# Patient Record
Sex: Female | Born: 1935 | Race: White | Hispanic: No | Smoking: Never smoker
Health system: Southern US, Community
[De-identification: ages and names within clinical notes are randomized; demographics above are authoritative.]

## PROBLEM LIST (undated history)

## (undated) DIAGNOSIS — R0902 Hypoxemia: Secondary | ICD-10-CM

## (undated) DIAGNOSIS — F419 Anxiety disorder, unspecified: Secondary | ICD-10-CM

## (undated) DIAGNOSIS — G709 Myoneural disorder, unspecified: Secondary | ICD-10-CM

## (undated) DIAGNOSIS — Z9181 History of falling: Secondary | ICD-10-CM

## (undated) DIAGNOSIS — Z85828 Personal history of other malignant neoplasm of skin: Secondary | ICD-10-CM

## (undated) DIAGNOSIS — H9190 Unspecified hearing loss, unspecified ear: Secondary | ICD-10-CM

## (undated) DIAGNOSIS — N189 Chronic kidney disease, unspecified: Secondary | ICD-10-CM

## (undated) DIAGNOSIS — M810 Age-related osteoporosis without current pathological fracture: Secondary | ICD-10-CM

## (undated) DIAGNOSIS — T7840XA Allergy, unspecified, initial encounter: Secondary | ICD-10-CM

## (undated) DIAGNOSIS — E039 Hypothyroidism, unspecified: Secondary | ICD-10-CM

## (undated) DIAGNOSIS — C801 Malignant (primary) neoplasm, unspecified: Secondary | ICD-10-CM

## (undated) DIAGNOSIS — IMO0002 Reserved for concepts with insufficient information to code with codable children: Secondary | ICD-10-CM

## (undated) DIAGNOSIS — I2510 Atherosclerotic heart disease of native coronary artery without angina pectoris: Secondary | ICD-10-CM

## (undated) DIAGNOSIS — H269 Unspecified cataract: Secondary | ICD-10-CM

## (undated) DIAGNOSIS — R011 Cardiac murmur, unspecified: Secondary | ICD-10-CM

## (undated) DIAGNOSIS — K219 Gastro-esophageal reflux disease without esophagitis: Secondary | ICD-10-CM

## (undated) DIAGNOSIS — E785 Hyperlipidemia, unspecified: Secondary | ICD-10-CM

## (undated) DIAGNOSIS — I509 Heart failure, unspecified: Secondary | ICD-10-CM

## (undated) DIAGNOSIS — I10 Essential (primary) hypertension: Secondary | ICD-10-CM

## (undated) DIAGNOSIS — M1990 Unspecified osteoarthritis, unspecified site: Secondary | ICD-10-CM

## (undated) DIAGNOSIS — I639 Cerebral infarction, unspecified: Secondary | ICD-10-CM

## (undated) DIAGNOSIS — F32A Depression, unspecified: Secondary | ICD-10-CM

## (undated) DIAGNOSIS — E119 Type 2 diabetes mellitus without complications: Secondary | ICD-10-CM

## (undated) HISTORY — DX: Cardiac murmur, unspecified: R01.1

## (undated) HISTORY — DX: Allergy, unspecified, initial encounter: T78.40XA

## (undated) HISTORY — DX: History of falling: Z91.81

## (undated) HISTORY — PX: DIAGNOSTIC MAMMOGRAM: HXRAD719

## (undated) HISTORY — DX: Myoneural disorder, unspecified: G70.9

## (undated) HISTORY — DX: Atherosclerotic heart disease of native coronary artery without angina pectoris: I25.10

## (undated) HISTORY — DX: Heart failure, unspecified: I50.9

## (undated) HISTORY — DX: Type 2 diabetes mellitus without complications: E11.9

## (undated) HISTORY — DX: Reserved for concepts with insufficient information to code with codable children: IMO0002

## (undated) HISTORY — DX: Age-related osteoporosis without current pathological fracture: M81.0

## (undated) HISTORY — DX: Hypoxemia: R09.02

## (undated) HISTORY — PX: BREAST SURGERY: SHX581

## (undated) HISTORY — DX: Hypothyroidism, unspecified: E03.9

## (undated) HISTORY — DX: Unspecified cataract: H26.9

## (undated) HISTORY — PX: OTHER SURGICAL HISTORY: SHX169

## (undated) HISTORY — PX: SKIN CANCER EXCISION: SHX779

## (undated) HISTORY — PX: CARDIAC CATHETERIZATION: SHX172

## (undated) HISTORY — DX: Malignant (primary) neoplasm, unspecified: C80.1

## (undated) HISTORY — DX: Unspecified osteoarthritis, unspecified site: M19.90

## (undated) HISTORY — PX: EYE SURGERY: SHX253

## (undated) HISTORY — DX: Cerebral infarction, unspecified: I63.9

## (undated) HISTORY — PX: ABDOMINAL HYSTERECTOMY: SHX81

## (undated) HISTORY — DX: Essential (primary) hypertension: I10

## (undated) HISTORY — DX: Depression, unspecified: F32.A

## (undated) HISTORY — PX: SPINE SURGERY: SHX786

## (undated) HISTORY — PX: CERVICAL SPINE SURGERY: SHX589

## (undated) HISTORY — PX: APPENDECTOMY: SHX54

## (undated) HISTORY — DX: Hyperlipidemia, unspecified: E78.5

## (undated) HISTORY — DX: Anxiety disorder, unspecified: F41.9

## (undated) HISTORY — PX: CARPAL TUNNEL RELEASE: SHX101

## (undated) HISTORY — DX: Gastro-esophageal reflux disease without esophagitis: K21.9

## (undated) HISTORY — PX: HERNIA REPAIR: SHX51

## (undated) HISTORY — DX: Chronic kidney disease, unspecified: N18.9

## (undated) HISTORY — PX: THORACIC SPINE SURGERY: SHX802

## (undated) HISTORY — DX: Personal history of other malignant neoplasm of skin: Z85.828

---

## 1958-03-18 HISTORY — PX: HYSTERECTOMY ABDOMINAL WITH SALPINGO-OOPHORECTOMY: SHX6792

## 1965-03-18 HISTORY — PX: CHOLECYSTECTOMY: SHX55

## 2010-03-18 HISTORY — PX: MASTECTOMY: SHX3

## 2020-08-18 ENCOUNTER — Encounter: Payer: Self-pay | Admitting: Neurology

## 2020-09-29 NOTE — Progress Notes (Addendum)
NEUROLOGY CONSULTATION NOTE  Amy Kelly MRN: [id number] DOB: [birth year]  Referring provider: Sherrilee Gilles, DO Primary care provider: Sherrilee Gilles, DO  Reason for consult:  stroke  Assessment/Plan:    Right thalamic lacunar infarct, likely secondary to small vessel disease Type 2 diabetes mellitus  Hypertension  Hyperlipidemia  1.Secondary stroke prevention as managed by PCP: - May stop ASA.  Continue Plavix '75mg'$  daily - Statin therapy:  LDL goal less than 70 - Glycemic control:  Hgb A1c goal less than 7 - Normotensive blood pressure 2.  For thalamic pain, gabapentin '100mg'$  at bedtime.  Cautioned for drowsiness and dizziness.  Advised to take on a night when her son may be able to stay with her in the apartment the following day in order to see how she feels. 3.  Follow up 6 months.   Subjective:  Amy Kelly is an 87 year old right-handed female with HTN, DM II, HLD, CAD, CKD and RLS who presents for stroke.  History supplemented by referring provider's note.  Accompanied by her son.  Current medications:  ASA '81mg'$ , Plavix '75mg'$ , rosuvastatin '40mg'$ , amlodipine, losartan glimepiride  Patient had a stroke in November 2021, presenting with left sided weakness and numbness and blurred vision.  She presented to the hospital in [place], [place].  CT head showed no acute findings but MRI of brain showed acute/subacute lacunar infarct within the right thalamus.  She was outside the window for tPA.  Carotid ultrasound demonstrated bilateral calcified plaque without hemodynamically significant stenosis.She was on ASA prior to stroke and discharged on DAPT.  Currently still taking ASA with Plavix.  She reports ongoing numbness and discomfort involving the left side of her head, face, arm, hand and leg.  She already has underlying polyneuropathy, gout and arthritis that impedes her balance and is now less steady.  Uses a rolling walker.       PAST MEDICAL HISTORY: Past Medical History:   Diagnosis Date   Anxiety and depression    At risk for falls    Coronary artery disease    Diabetes mellitus without complication (HCC)    GERD (gastroesophageal reflux disease)    History of skin cancer    Right shin   Hyperlipidemia    Hypertension    Hypothyroidism    Stroke [facility])     PAST SURGICAL HISTORY: Past Surgical History:  Procedure Laterality Date   CARDIAC CATHETERIZATION     [id number]   CARPAL TUNNEL RELEASE Left    CHOLECYSTECTOMY  1967   DIAGNOSTIC MAMMOGRAM     HERNIA REPAIR     HYSTERECTOMY ABDOMINAL WITH SALPINGO-OOPHORECTOMY  1960   MASTECTOMY Right 2012    MEDICATIONS: Current Outpatient Medications on File Prior to Visit  Medication Sig Dispense Refill   amLODipine (NORVASC) 10 MG tablet Take 10 mg by mouth daily.     Ascorbic Acid (VITAMIN C PO) Take 250 mg by mouth in the morning and at bedtime.     Calcium-Vitamin D 600-200 MG-UNIT tablet Take 5,000 Units by mouth daily.     carbidopa-levodopa (SINEMET IR) 25-100 MG tablet Take 25-100 mg by mouth 3 (three) times daily.     clopidogrel (PLAVIX) 75 MG tablet Take 75 mg by mouth daily.     Cranberry-Vitamin C-Vitamin E (CRANBERRY PLUS VITAMIN C) 4200-20-3 MG-MG-UNIT CAPS Take 4,200 mg by mouth in the morning and at bedtime.     doxazosin (CARDURA) 2 MG tablet Take 2 mg by mouth daily.     escitalopram (LEXAPRO)  5 MG tablet Take 5 mg by mouth daily.     glimepiride (AMARYL) 2 MG tablet Take 2 mg by mouth daily.     insulin aspart (NOVOLOG) 100 UNIT/ML FlexPen Inject into the skin.     insulin glargine (LANTUS SOLOSTAR) 100 UNIT/ML Solostar Pen Inject into the skin.     levothyroxine (SYNTHROID) 25 MCG tablet Take 25 mcg by mouth daily.     losartan (COZAAR) 100 MG tablet Take 100 mg by mouth daily.     metoprolol tartrate (LOPRESSOR) 25 MG tablet Take 25 mg by mouth in the morning and at bedtime.     Multiple Vitamin (MULTIVITAMIN ADULT PO) Take by mouth.     omeprazole (PRILOSEC) 20 MG capsule  Take by mouth.     rosuvastatin (CRESTOR) 40 MG tablet Take 40 mg by mouth daily.     No current facility-administered medications on file prior to visit.      ALLERGIES: Not on File  FAMILY HISTORY: No family history on file.  Objective:  Blood pressure (!) 157/59, pulse (!) 54, height '5\' 3"'$  (1.6 m), weight 199 lb 12.8 oz (90.6 kg), SpO2 95 %. General: No acute distress.  Patient appears well-groomed.   Head:  Normocephalic/atraumatic Eyes:  fundi examined but not visualized Neck: supple, no paraspinal tenderness, full range of motion Back: No paraspinal tenderness Heart: regular rate and rhythm Lungs: Clear to auscultation bilaterally. Vascular: No carotid bruits. Neurological Exam: Mental status: alert and oriented to person, place, and time, recent and remote memory intact, fund of knowledge intact, attention and concentration intact, speech fluent and not dysarthric, language intact. Cranial nerves: CN I: not tested CN II: pupils equal, round and reactive to light, visual fields intact CN III, IV, VI:  full range of motion, no nystagmus, no ptosis CN V: reduced left V1-V3 distribution CN VII: upper and lower face symmetric CN VIII: hearing intact CN IX, X: gag intact, uvula midline CN XI: sternocleidomastoid and trapezius muscles intact CN XII: tongue midline Bulk & Tone: normal, no fasciculations. Motor:  muscle strength 5-/5 throughout Sensation:  Pinprick sensation reduced in left hand and bilateral lower extremities, vibratory sensation reduced in bilateral lower extremities. Deep Tendon Reflexes:  absent throughout,  toes downgoing.   Finger to nose testing:  Without dysmetria.   Heel to shin:  Without dysmetria.   Gait:  Cautious unsteady wide-based stance and gait.  Uses rolling walker for assistance.      Thank you for allowing me to take part in the care of this patient.  Metta Clines, DO  CC: Sherrilee Gilles, DO

## 2020-10-02 ENCOUNTER — Other Ambulatory Visit: Payer: Self-pay

## 2020-10-02 ENCOUNTER — Encounter: Payer: Self-pay | Admitting: Neurology

## 2020-10-02 ENCOUNTER — Ambulatory Visit (INDEPENDENT_AMBULATORY_CARE_PROVIDER_SITE_OTHER): Payer: Medicare HMO | Admitting: Neurology

## 2020-10-02 VITALS — BP 157/59 | HR 54 | Ht 63.0 in | Wt 199.8 lb

## 2020-10-02 DIAGNOSIS — I6381 Other cerebral infarction due to occlusion or stenosis of small artery: Secondary | ICD-10-CM

## 2020-10-02 DIAGNOSIS — E785 Hyperlipidemia, unspecified: Secondary | ICD-10-CM

## 2020-10-02 DIAGNOSIS — E1142 Type 2 diabetes mellitus with diabetic polyneuropathy: Secondary | ICD-10-CM

## 2020-10-02 DIAGNOSIS — I10 Essential (primary) hypertension: Secondary | ICD-10-CM | POA: Diagnosis not present

## 2020-10-02 DIAGNOSIS — I639 Cerebral infarction, unspecified: Secondary | ICD-10-CM

## 2020-10-02 DIAGNOSIS — G890 Central pain syndrome: Secondary | ICD-10-CM

## 2020-10-02 MED ORDER — GABAPENTIN 100 MG PO CAPS: 100.0000 mg | ORAL_CAPSULE | Freq: Every day | ORAL | 5 refills | Status: DC

## 2020-10-02 NOTE — Patient Instructions (Signed)
Stop aspirin.  Continue clopidogrel '75mg'$  daily  Continue rosuvastatin Continue blood pressure medications Continue diabetes medication For left arm pain, take gabapentin '100mg'$  at bedtime.  Caution for drowsiness/dizziness.  Take it on a night that your son will stay with you in your apartment the next day. Follow up 6 months.

## 2020-11-06 LAB — HM DEXA SCAN

## 2020-11-08 ENCOUNTER — Other Ambulatory Visit: Payer: Self-pay

## 2020-11-08 ENCOUNTER — Encounter (HOSPITAL_COMMUNITY): Payer: Self-pay | Admitting: *Deleted

## 2020-11-08 ENCOUNTER — Inpatient Hospital Stay (HOSPITAL_COMMUNITY)
Admission: EM | Admit: 2020-11-08 | Discharge: 2020-11-11 | DRG: 291 | Disposition: A | Discharge status: Home Health | Payer: Medicare HMO | Attending: Family Medicine | Admitting: Family Medicine

## 2020-11-08 ENCOUNTER — Emergency Department (HOSPITAL_COMMUNITY): Payer: Medicare HMO

## 2020-11-08 DIAGNOSIS — I130 Hypertensive heart and chronic kidney disease with heart failure and stage 1 through stage 4 chronic kidney disease, or unspecified chronic kidney disease: Secondary | ICD-10-CM | POA: Diagnosis present

## 2020-11-08 DIAGNOSIS — Z85828 Personal history of other malignant neoplasm of skin: Secondary | ICD-10-CM

## 2020-11-08 DIAGNOSIS — M7989 Other specified soft tissue disorders: Secondary | ICD-10-CM | POA: Diagnosis present

## 2020-11-08 DIAGNOSIS — I5082 Biventricular heart failure: Secondary | ICD-10-CM | POA: Diagnosis present

## 2020-11-08 DIAGNOSIS — E1122 Type 2 diabetes mellitus with diabetic chronic kidney disease: Secondary | ICD-10-CM | POA: Diagnosis present

## 2020-11-08 DIAGNOSIS — E119 Type 2 diabetes mellitus without complications: Secondary | ICD-10-CM

## 2020-11-08 DIAGNOSIS — Z8249 Family history of ischemic heart disease and other diseases of the circulatory system: Secondary | ICD-10-CM

## 2020-11-08 DIAGNOSIS — D649 Anemia, unspecified: Secondary | ICD-10-CM | POA: Diagnosis present

## 2020-11-08 DIAGNOSIS — Z7902 Long term (current) use of antithrombotics/antiplatelets: Secondary | ICD-10-CM

## 2020-11-08 DIAGNOSIS — Z8673 Personal history of transient ischemic attack (TIA), and cerebral infarction without residual deficits: Secondary | ICD-10-CM

## 2020-11-08 DIAGNOSIS — G259 Extrapyramidal and movement disorder, unspecified: Secondary | ICD-10-CM | POA: Diagnosis present

## 2020-11-08 DIAGNOSIS — I5033 Acute on chronic diastolic (congestive) heart failure: Secondary | ICD-10-CM | POA: Diagnosis present

## 2020-11-08 DIAGNOSIS — I2510 Atherosclerotic heart disease of native coronary artery without angina pectoris: Secondary | ICD-10-CM | POA: Diagnosis present

## 2020-11-08 DIAGNOSIS — K219 Gastro-esophageal reflux disease without esophagitis: Secondary | ICD-10-CM | POA: Diagnosis present

## 2020-11-08 DIAGNOSIS — Z79890 Hormone replacement therapy (postmenopausal): Secondary | ICD-10-CM

## 2020-11-08 DIAGNOSIS — Z9079 Acquired absence of other genital organ(s): Secondary | ICD-10-CM

## 2020-11-08 DIAGNOSIS — Z9011 Acquired absence of right breast and nipple: Secondary | ICD-10-CM

## 2020-11-08 DIAGNOSIS — E039 Hypothyroidism, unspecified: Secondary | ICD-10-CM | POA: Diagnosis present

## 2020-11-08 DIAGNOSIS — R1310 Dysphagia, unspecified: Secondary | ICD-10-CM | POA: Diagnosis present

## 2020-11-08 DIAGNOSIS — H9190 Unspecified hearing loss, unspecified ear: Secondary | ICD-10-CM | POA: Diagnosis present

## 2020-11-08 DIAGNOSIS — E8770 Fluid overload, unspecified: Secondary | ICD-10-CM | POA: Diagnosis present

## 2020-11-08 DIAGNOSIS — I10 Essential (primary) hypertension: Secondary | ICD-10-CM | POA: Diagnosis present

## 2020-11-08 DIAGNOSIS — Z882 Allergy status to sulfonamides status: Secondary | ICD-10-CM

## 2020-11-08 DIAGNOSIS — R001 Bradycardia, unspecified: Secondary | ICD-10-CM | POA: Diagnosis present

## 2020-11-08 DIAGNOSIS — N185 Chronic kidney disease, stage 5: Secondary | ICD-10-CM | POA: Diagnosis present

## 2020-11-08 DIAGNOSIS — N179 Acute kidney failure, unspecified: Secondary | ICD-10-CM | POA: Diagnosis present

## 2020-11-08 DIAGNOSIS — E785 Hyperlipidemia, unspecified: Secondary | ICD-10-CM | POA: Diagnosis present

## 2020-11-08 DIAGNOSIS — E1169 Type 2 diabetes mellitus with other specified complication: Secondary | ICD-10-CM

## 2020-11-08 DIAGNOSIS — N184 Chronic kidney disease, stage 4 (severe): Secondary | ICD-10-CM | POA: Diagnosis present

## 2020-11-08 DIAGNOSIS — Z79899 Other long term (current) drug therapy: Secondary | ICD-10-CM

## 2020-11-08 DIAGNOSIS — Z20822 Contact with and (suspected) exposure to covid-19: Secondary | ICD-10-CM | POA: Diagnosis present

## 2020-11-08 DIAGNOSIS — J9601 Acute respiratory failure with hypoxia: Secondary | ICD-10-CM | POA: Diagnosis present

## 2020-11-08 DIAGNOSIS — Z66 Do not resuscitate: Secondary | ICD-10-CM | POA: Diagnosis present

## 2020-11-08 DIAGNOSIS — Z880 Allergy status to penicillin: Secondary | ICD-10-CM

## 2020-11-08 DIAGNOSIS — Z9181 History of falling: Secondary | ICD-10-CM

## 2020-11-08 DIAGNOSIS — F419 Anxiety disorder, unspecified: Secondary | ICD-10-CM | POA: Diagnosis present

## 2020-11-08 DIAGNOSIS — N1831 Chronic kidney disease, stage 3a: Secondary | ICD-10-CM

## 2020-11-08 DIAGNOSIS — F32A Depression, unspecified: Secondary | ICD-10-CM | POA: Diagnosis present

## 2020-11-08 DIAGNOSIS — I2699 Other pulmonary embolism without acute cor pulmonale: Secondary | ICD-10-CM

## 2020-11-08 DIAGNOSIS — Z90710 Acquired absence of both cervix and uterus: Secondary | ICD-10-CM

## 2020-11-08 DIAGNOSIS — I509 Heart failure, unspecified: Secondary | ICD-10-CM

## 2020-11-08 DIAGNOSIS — N183 Chronic kidney disease, stage 3 unspecified: Secondary | ICD-10-CM | POA: Diagnosis present

## 2020-11-08 DIAGNOSIS — Z794 Long term (current) use of insulin: Secondary | ICD-10-CM

## 2020-11-08 DIAGNOSIS — Z888 Allergy status to other drugs, medicaments and biological substances status: Secondary | ICD-10-CM

## 2020-11-08 DIAGNOSIS — I639 Cerebral infarction, unspecified: Secondary | ICD-10-CM | POA: Diagnosis present

## 2020-11-08 DIAGNOSIS — R0603 Acute respiratory distress: Secondary | ICD-10-CM | POA: Diagnosis not present

## 2020-11-08 HISTORY — DX: Type 2 diabetes mellitus without complications: E11.9

## 2020-11-08 HISTORY — DX: Unspecified hearing loss, unspecified ear: H91.90

## 2020-11-08 LAB — GLUCOSE, CAPILLARY: Glucose-Capillary: 157 mg/dL — ABNORMAL HIGH (ref 70–99)

## 2020-11-08 LAB — LACTIC ACID, PLASMA
Lactic Acid, Venous: 1.2 mmol/L (ref 0.5–1.9)
Lactic Acid, Venous: 0.9 mmol/L (ref 0.5–1.9)

## 2020-11-08 LAB — CBC WITH DIFFERENTIAL/PLATELET
Abs Immature Granulocytes: 0.03 10*3/uL (ref 0.00–0.07)
Basophils Absolute: 0.1 10*3/uL (ref 0.0–0.1)
Basophils Relative: 1 %
Eosinophils Absolute: 0.2 10*3/uL (ref 0.0–0.5)
Eosinophils Relative: 3 %
HCT: 31.6 % — ABNORMAL LOW (ref 36.0–46.0)
Hemoglobin: 9.9 g/dL — ABNORMAL LOW (ref 12.0–15.0)
Immature Granulocytes: 0 %
Lymphocytes Relative: 13 %
Lymphs Abs: 0.9 10*3/uL (ref 0.7–4.0)
MCH: 29.8 pg (ref 26.0–34.0)
MCHC: 31.3 g/dL (ref 30.0–36.0)
MCV: 95.2 fL (ref 80.0–100.0)
Monocytes Absolute: 0.7 10*3/uL (ref 0.1–1.0)
Monocytes Relative: 10 %
Neutro Abs: 5.1 10*3/uL (ref 1.7–7.7)
Neutrophils Relative %: 73 %
Platelets: 147 10*3/uL — ABNORMAL LOW (ref 150–400)
RBC: 3.32 MIL/uL — ABNORMAL LOW (ref 3.87–5.11)
RDW: 15.9 % — ABNORMAL HIGH (ref 11.5–15.5)
WBC: 6.9 10*3/uL (ref 4.0–10.5)
nRBC: 0 % (ref 0.0–0.2)

## 2020-11-08 LAB — RESP PANEL BY RT-PCR (FLU A&B, COVID) ARPGX2
Influenza A by PCR: NEGATIVE
Influenza B by PCR: NEGATIVE
SARS Coronavirus 2 by RT PCR: NEGATIVE

## 2020-11-08 LAB — BASIC METABOLIC PANEL
Anion gap: 6 (ref 5–15)
BUN: 29 mg/dL — ABNORMAL HIGH (ref 8–23)
CO2: 25 mmol/L (ref 22–32)
Calcium: 8.5 mg/dL — ABNORMAL LOW (ref 8.9–10.3)
Chloride: 110 mmol/L (ref 98–111)
Creatinine, Ser: 1.65 mg/dL — ABNORMAL HIGH (ref 0.44–1.00)
GFR, Estimated: 30 mL/min — ABNORMAL LOW (ref >60)
Glucose, Bld: 212 mg/dL — ABNORMAL HIGH (ref 70–99)
Potassium: 3.9 mmol/L (ref 3.5–5.1)
Sodium: 141 mmol/L (ref 135–145)

## 2020-11-08 LAB — TSH: TSH: 4.565 u[IU]/mL — ABNORMAL HIGH (ref 0.350–4.500)

## 2020-11-08 LAB — TROPONIN I (HIGH SENSITIVITY)
Troponin I (High Sensitivity): 7 ng/L (ref <18)
Troponin I (High Sensitivity): 8 ng/L (ref <18)

## 2020-11-08 LAB — BRAIN NATRIURETIC PEPTIDE: B Natriuretic Peptide: 299 pg/mL — ABNORMAL HIGH (ref 0.0–100.0)

## 2020-11-08 LAB — MAGNESIUM: Magnesium: 2 mg/dL (ref 1.7–2.4)

## 2020-11-08 MED ORDER — ACETAMINOPHEN 650 MG RE SUPP: 650.0000 mg | Freq: Four times a day (QID) | RECTAL | Status: DC | PRN

## 2020-11-08 MED ORDER — FUROSEMIDE 10 MG/ML IJ SOLN
40.0000 mg | Freq: Once | INTRAMUSCULAR | Status: AC
  Administered 2020-11-08: 40 mg via INTRAVENOUS
  Filled 2020-11-08: qty 4

## 2020-11-08 MED ORDER — PNEUMOCOCCAL VAC POLYVALENT 25 MCG/0.5ML IJ INJ: 0.5000 mL | INJECTION | INTRAMUSCULAR | Status: DC

## 2020-11-08 MED ORDER — ROSUVASTATIN CALCIUM 20 MG PO TABS
40.0000 mg | ORAL_TABLET | Freq: Every day | ORAL | Status: DC
  Administered 2020-11-09 – 2020-11-11 (×3): 40 mg via ORAL
  Filled 2020-11-08 (×3): qty 2

## 2020-11-08 MED ORDER — INSULIN ASPART 100 UNIT/ML IJ SOLN
0.0000 [IU] | Freq: Three times a day (TID) | INTRAMUSCULAR | Status: DC
  Administered 2020-11-09: 3 [IU] via SUBCUTANEOUS
  Administered 2020-11-09 (×2): 5 [IU] via SUBCUTANEOUS
  Administered 2020-11-10: 3 [IU] via SUBCUTANEOUS
  Administered 2020-11-10: 2 [IU] via SUBCUTANEOUS
  Administered 2020-11-11: 5 [IU] via SUBCUTANEOUS
  Administered 2020-11-11 (×2): 3 [IU] via SUBCUTANEOUS

## 2020-11-08 MED ORDER — FUROSEMIDE 10 MG/ML IJ SOLN
40.0000 mg | Freq: Two times a day (BID) | INTRAMUSCULAR | Status: DC
  Administered 2020-11-09 – 2020-11-10 (×3): 40 mg via INTRAVENOUS
  Filled 2020-11-08 (×3): qty 4

## 2020-11-08 MED ORDER — METHYLPREDNISOLONE SODIUM SUCC 125 MG IJ SOLR
60.0000 mg | Freq: Once | INTRAMUSCULAR | Status: AC
  Administered 2020-11-08: 60 mg via INTRAVENOUS
  Filled 2020-11-08: qty 2

## 2020-11-08 MED ORDER — INSULIN ASPART 100 UNIT/ML IJ SOLN
0.0000 [IU] | Freq: Every day | INTRAMUSCULAR | Status: DC
  Administered 2020-11-09: 2 [IU] via SUBCUTANEOUS

## 2020-11-08 MED ORDER — IPRATROPIUM-ALBUTEROL 0.5-2.5 (3) MG/3ML IN SOLN
3.0000 mL | Freq: Once | RESPIRATORY_TRACT | Status: AC
  Administered 2020-11-08: 3 mL via RESPIRATORY_TRACT
  Filled 2020-11-08: qty 3

## 2020-11-08 MED ORDER — INSULIN GLARGINE-YFGN 100 UNIT/ML ~~LOC~~ SOLN
5.0000 [IU] | Freq: Every day | SUBCUTANEOUS | Status: DC
Start: 2020-11-09 — End: 2020-11-11
  Administered 2020-11-09 – 2020-11-11 (×3): 5 [IU] via SUBCUTANEOUS
  Filled 2020-11-08 (×5): qty 0.05

## 2020-11-08 MED ORDER — ONDANSETRON HCL 4 MG/2ML IJ SOLN: 4.0000 mg | Freq: Four times a day (QID) | INTRAMUSCULAR | Status: DC | PRN

## 2020-11-08 MED ORDER — ENOXAPARIN SODIUM 30 MG/0.3ML IJ SOSY
30.0000 mg | PREFILLED_SYRINGE | INTRAMUSCULAR | Status: DC
  Administered 2020-11-08 – 2020-11-10 (×3): 30 mg via SUBCUTANEOUS
  Filled 2020-11-08 (×3): qty 0.3

## 2020-11-08 MED ORDER — POLYETHYLENE GLYCOL 3350 17 G PO PACK: 17.0000 g | PACK | Freq: Every day | ORAL | Status: DC | PRN

## 2020-11-08 MED ORDER — HYDRALAZINE HCL 20 MG/ML IJ SOLN: 10.0000 mg | INTRAMUSCULAR | Status: DC | PRN

## 2020-11-08 MED ORDER — ACETAMINOPHEN 325 MG PO TABS: 650.0000 mg | ORAL_TABLET | Freq: Four times a day (QID) | ORAL | Status: DC | PRN

## 2020-11-08 MED ORDER — ONDANSETRON HCL 4 MG PO TABS: 4.0000 mg | ORAL_TABLET | Freq: Four times a day (QID) | ORAL | Status: DC | PRN

## 2020-11-08 MED ORDER — CLOPIDOGREL BISULFATE 75 MG PO TABS
75.0000 mg | ORAL_TABLET | Freq: Every day | ORAL | Status: DC
  Administered 2020-11-09 – 2020-11-11 (×3): 75 mg via ORAL
  Filled 2020-11-08 (×3): qty 1

## 2020-11-08 MED ORDER — ALPRAZOLAM 0.5 MG PO TABS
0.5000 mg | ORAL_TABLET | Freq: Every day | ORAL | Status: AC
  Administered 2020-11-08: 0.5 mg via ORAL
  Filled 2020-11-08: qty 1

## 2020-11-08 NOTE — ED Notes (Signed)
md into speak with pt

## 2020-11-08 NOTE — ED Notes (Signed)
Have you seen this pt yet?  I told her that we were about to take her upstairs and that she was admitted and she doesn't want to go.  She stated crying and I tried to explain to her why she was staying and that she need oxygen.  She still wants to leave.  md messaged and will come speak with pt.

## 2020-11-08 NOTE — H&P (Addendum)
History and Physical    Amy Kelly [id number] DOB: June 22, 1928 DOA: 11/08/2020  PCP: Sherrilee Gilles, DO   Patient coming from: Home  I have personally briefly reviewed patient's old medical records in [place]  Chief Complaint: Difficulty breathing  HPI: Amy Kelly is a 87 y.o. female with medical history significant for DM, Stroke, HTN, hypothyroidism, coronary artery disease. Patient presented to the ED with complaints of difficulty breathing of 2 weeks duration.  Patient's son [name] is at bedside and helps with the history.  She reports breathing is worse with exertion.  Chronic stable orthopnea.  She has chronic bilateral lower extremity swelling that has been intermittent over the past year at least, she is unsure if it is worse. She reports chest tightness due to difficulty breathing without actual chest pain that started today.  Son at bedside reports patient has anxiety and panic attacks, and feels this is why she was having the chest tightness.  She does not know if she has gained weight, and is unsure of her baseline weight. At baseline patient is not very active.  ED Course: Temperature 97.6.  Bradycardic - heart rate 40s.  Respiratory rate 13-20.  Blood pressure systolic 0000000 to [id number].  O2 sats down to 88% on room air, currently on 3 L sats greater than 93%.  Chest x-ray showing findings consistent with mild congestive heart failure and interstitial edema. Low hemoglobin 9.9.  Troponin 7.  BNP 299. IV Lasix 40 mg given.  Hospitalist to admit for acute respiratory failure.  Review of Systems: As per HPI all other systems reviewed and negative.  Past Medical History:  Diagnosis Date   Anxiety and depression    At risk for falls    Coronary artery disease    Diabetes mellitus without complication (HCC)    GERD (gastroesophageal reflux disease)    History of skin cancer    Right shin   HOH (hard of hearing)    Hyperlipidemia    Hypertension    Hypothyroidism     Stroke [facility])     Past Surgical History:  Procedure Laterality Date   CARDIAC CATHETERIZATION     [phone]   CARPAL TUNNEL RELEASE Left    CHOLECYSTECTOMY  1967   DIAGNOSTIC MAMMOGRAM     HERNIA REPAIR     HYSTERECTOMY ABDOMINAL WITH SALPINGO-OOPHORECTOMY  1960   MASTECTOMY Right 2012     reports that she has never smoked. She has never been exposed to tobacco smoke. She has never used smokeless tobacco. She reports that she does not drink alcohol and does not use drugs.  Allergies  Allergen Reactions   Penicillins Diarrhea and Nausea Only   Prednisone Diarrhea and Nausea Only   Sulfa Antibiotics Diarrhea and Nausea Only   Family history of hypertension.  Prior to Admission medications   Medication Sig Start Date End Date Taking? Authorizing Provider  amLODipine (NORVASC) 10 MG tablet Take 10 mg by mouth daily. 05/01/20   [provider]  Ascorbic Acid (VITAMIN C PO) Take 250 mg by mouth in the morning and at bedtime. 05/30/20   [provider]  Calcium-Vitamin D 600-200 MG-UNIT tablet Take 5,000 Units by mouth daily. 06/25/18   [provider]  carbidopa-levodopa (SINEMET IR) 25-100 MG tablet Take 25-100 mg by mouth 3 (three) times daily. 07/31/20   [provider]  clopidogrel (PLAVIX) 75 MG tablet Take 75 mg by mouth daily. 04/10/20   [provider]  Cranberry-Vitamin C-Vitamin E (CRANBERRY PLUS  VITAMIN C) 4200-20-3 MG-MG-UNIT CAPS Take 4,200 mg by mouth in the morning and at bedtime. 05/30/20   [provider]  doxazosin (CARDURA) 2 MG tablet Take 2 mg by mouth daily. 03/30/20   [provider]  escitalopram (LEXAPRO) 5 MG tablet Take 5 mg by mouth daily. 11/26/19   [provider]  gabapentin (NEURONTIN) 100 MG capsule Take 1 capsule (100 mg total) by mouth at bedtime. 10/02/20   Tomi Likens, Adam R, DO  glimepiride (AMARYL) 2 MG tablet Take 2 mg by mouth daily. 03/13/20   [provider]  insulin aspart  (NOVOLOG) 100 UNIT/ML FlexPen Inject into the skin. 04/10/20   [provider]  insulin glargine (LANTUS SOLOSTAR) 100 UNIT/ML Solostar Pen Inject into the skin. 05/02/20   [provider]  levothyroxine (SYNTHROID) 25 MCG tablet Take 25 mcg by mouth daily. 07/07/20   [provider]  losartan (COZAAR) 100 MG tablet Take 100 mg by mouth daily. 03/30/20   [provider]  metoprolol tartrate (LOPRESSOR) 25 MG tablet Take 25 mg by mouth in the morning and at bedtime. 09/18/20   [provider]  Multiple Vitamin (MULTIVITAMIN ADULT PO) Take by mouth. 05/30/20   [provider]  omeprazole (PRILOSEC) 20 MG capsule Take by mouth. 06/08/19   [provider]  rosuvastatin (CRESTOR) 40 MG tablet Take 40 mg by mouth daily. 05/12/20   [provider]    Physical Exam: Vitals:   11/08/20 1503 11/08/20 1600 11/08/20 1634 11/08/20 1700  BP:  (!) 158/52  (!) 149/58  Pulse:  (!) 45  (!) 46  Resp:  13  18  Temp:      SpO2:  97% 96% 99%  Height: '5\' 3"'$  (1.6 m)       Constitutional: Patient is crying, anxious, very upset when she was told she needed to be admitted.    Vitals:   11/08/20 1503 11/08/20 1600 11/08/20 1634 11/08/20 1700  BP:  (!) 158/52  (!) 149/58  Pulse:  (!) 45  (!) 46  Resp:  13  18  Temp:      SpO2:  97% 96% 99%  Height: '5\' 3"'$  (1.6 m)      Eyes: PERRL, lids and conjunctivae normal ENMT: Mucous membranes are moist.  Neck: normal, supple, no masses, no thyromegaly Respiratory: clear to auscultation bilaterally, no wheezing, no crackles. Normal respiratory effort. No accessory muscle use.  Cardiovascular: Bradycardic, regular rate and rhythm, no murmurs / rubs / gallops.  Bilateral pitting lower extremity swelling, +1 to knees.   Lower extremities warm and well-perfused. Abdomen: no tenderness, no masses palpated. No hepatosplenomegaly. Bowel sounds positive.  Musculoskeletal: no clubbing / cyanosis. No joint deformity  upper and lower extremities. Good ROM, no contractures. Normal muscle tone.  Skin: no rashes, lesions, ulcers. No induration Neurologic: Moving all extremities spontaneously, no apparent cranial nerve abnormality. Psychiatric: Crying, very anxious, later calmed down and was able to answer questions..   Labs on Admission: I have personally reviewed following labs and imaging studies  CBC: Recent Labs  Lab 11/08/20 1523  WBC 6.9  NEUTROABS 5.1  HGB 9.9*  HCT 31.6*  MCV 95.2  PLT [id number]*   Basic Metabolic Panel: Recent Labs  Lab 11/08/20 1523  NA 141  K 3.9  CL 110  CO2 25  GLUCOSE 212*  BUN 29*  CREATININE 1.65*  CALCIUM 8.5*    Radiological Exams on Admission: DG Chest Port 1 View  Result Date: 11/08/2020 CLINICAL  DATA:  Short of breath for 2 weeks, pain with inspiration EXAM: PORTABLE CHEST 1 VIEW COMPARISON:  None. FINDINGS: Single frontal view of the chest demonstrates an enlarged cardiac silhouette. There is central vascular congestion with basilar predominant interstitial opacities and trace bilateral pleural effusions, most consistent with mild interstitial edema and volume overload. No pneumothorax. IMPRESSION: 1. Findings consistent with mild congestive heart failure and interstitial edema. Electronically Signed   By: Randa Ngo M.D.   On: 11/08/2020 15:56    EKG: Independently reviewed.  Sinus bradycardia, rate 47, PR interval 148.  RBBB, LAFB, QTC 447.  Assessment/Plan Principal Problem:   Acute respiratory failure with hypoxia (HCC) Active Problems:   Fluid overload   Diabetes mellitus (HCC)   HTN (hypertension)   Bradycardia   Hypothyroidism   Anxiety   Stroke ([facility])   Acute respiratory failure with hypoxia, O2 sats 88% on room air.  Currently, last sats 93 to 99%.  Chest x-ray suggesting CHF. -Supplemental O2.  Fluid overload -with findings consistent of mild CHF and interstitial edema, +1 pitting extremity edema.  BNP elevated-299, no prior to  compare.  Troponin 7 > 8.  EKG without significant ST or T wave abnormalities, no old EKG to compare. -Lasix 40 twice daily -Obtain Echo -Bilateral lower extremity Dopplers  Bradycardia-heart rate 40s, patient awake alert sitting up in bed.  Likely symptomatic and contributing to her dyspnea on exertion. -check magnesium -Check TSH -Cardiology consultation -Hold metoprolol  Anemia-hemoglobin 9.9.  No prior to compare.  No obvious GI blood loss -CBC in the morning -Anemia panel in the morning.  Dysphagia-reports history of multiple esophageal dilatations. -GI consult  CKD 3B- Cr 1.65 -Monitor with diuresis  Diabetes-random glucose 212. - HgbA1c - SSI- M -Med reconciliation pending - hold glimepiride -Son reports patient takes Lantus 5 units in the morning,will resume  Hypertension-stable. -Med reconciliation pending, patient is unable to give me details of medications from patient  Hypothyroidism-reports recently abnormal thyroid numbers requiring increased Synthroid dose by PCP. -Check TSH with bradycardia -Pending med reconciliation resume Synthroid  Dyslipidemia -resume Crestor  Anxiety/depression-anxious, crying. -Pending med reconciliation, resume Lexapro -Xanax 0.5x1 for sleep.  CVA-stable. -resume Plavix, statins  DVT prophylaxis: Lovenox Code Status:  DNR-confirmed with son [name] at bedside. Family Communication: Talked to son [name] at bedside who is healthcare power of attorney. Disposition Plan: ~ 2 days Consults called: Cardiology Admission status: Inpt, tele I certify that at the point of admission it is my clinical judgment that the patient will require inpatient hospital care spanning beyond 2 midnights from the point of admission due to high intensity of service, high risk for further deterioration and high frequency of surveillance required.   Bethena Roys MD [facility]  11/08/2020, 8:24 PM

## 2020-11-08 NOTE — ED Triage Notes (Signed)
Pt with SOB x 2 weeks and pt denies using home O2. Pt with chest pain with deep breathing.  RA sats 86-88% in triage. Pt very HOH. Has not been tested for covid in past couple of weeks.

## 2020-11-08 NOTE — ED Provider Notes (Signed)
[facility] EMERGENCY DEPARTMENT Provider Note   CSN: [id number] Arrival date & time: 11/08/20  1449     History Chief Complaint  Patient presents with   Shortness of Breath    Amy Kelly is a 87 y.o. female.  Patient presents to ER chief complaint of shortness of breath.  Symptoms ongoing for about 2 weeks.  Associated cough.  Denies fevers.  Also complaining of chest tightness.  Denies vomiting or diarrhea.      Past Medical History:  Diagnosis Date   Anxiety and depression    At risk for falls    Coronary artery disease    Diabetes mellitus without complication (HCC)    GERD (gastroesophageal reflux disease)    History of skin cancer    Right shin   HOH (hard of hearing)    Hyperlipidemia    Hypertension    Hypothyroidism    Stroke [facility])     Patient Active Problem List   Diagnosis Date Noted   Acute respiratory failure with hypoxia ([place]) 11/08/2020    Past Surgical History:  Procedure Laterality Date   CARDIAC CATHETERIZATION     [id number]   CARPAL TUNNEL RELEASE Left    CHOLECYSTECTOMY  1967   DIAGNOSTIC MAMMOGRAM     HERNIA REPAIR     HYSTERECTOMY ABDOMINAL WITH SALPINGO-OOPHORECTOMY  1960   MASTECTOMY Right 2012     OB History   No obstetric history on file.     History reviewed. No pertinent family history.  Social History   Tobacco Use   Smoking status: Never    Passive exposure: Never   Smokeless tobacco: Never  Vaping Use   Vaping Use: Never used  Substance Use Topics   Alcohol use: Never   Drug use: Never    Home Medications Prior to Admission medications   Medication Sig Start Date End Date Taking? Authorizing Provider  amLODipine (NORVASC) 10 MG tablet Take 10 mg by mouth daily. 05/01/20   [provider]  Ascorbic Acid (VITAMIN C PO) Take 250 mg by mouth in the morning and at bedtime. 05/30/20   [provider]  Calcium-Vitamin D 600-200 MG-UNIT tablet Take 5,000 Units by mouth daily. 06/25/18   [provider]  carbidopa-levodopa (SINEMET IR) 25-100 MG tablet Take 25-100 mg by mouth 3 (three) times daily. 07/31/20   [provider]  clopidogrel (PLAVIX) 75 MG tablet Take 75 mg by mouth daily. 04/10/20   [provider]  Cranberry-Vitamin C-Vitamin E (CRANBERRY PLUS VITAMIN C) 4200-20-3 MG-MG-UNIT CAPS Take 4,200 mg by mouth in the morning and at bedtime. 05/30/20   [provider]  doxazosin (CARDURA) 2 MG tablet Take 2 mg by mouth daily. 03/30/20   [provider]  escitalopram (LEXAPRO) 5 MG tablet Take 5 mg by mouth daily. 11/26/19   [provider]  gabapentin (NEURONTIN) 100 MG capsule Take 1 capsule (100 mg total) by mouth at bedtime. 10/02/20   Tomi Likens, Adam R, DO  glimepiride (AMARYL) 2 MG tablet Take 2 mg by mouth daily. 03/13/20   [provider]  insulin aspart (NOVOLOG) 100 UNIT/ML FlexPen Inject into the skin. 04/10/20   [provider]  insulin glargine (LANTUS SOLOSTAR) 100 UNIT/ML Solostar Pen Inject into the skin. 05/02/20   [provider]  levothyroxine (SYNTHROID) 25 MCG tablet Take 25 mcg by mouth daily. 07/07/20   [provider]  losartan (COZAAR) 100 MG tablet Take 100 mg by mouth daily. 03/30/20   [provider]  metoprolol tartrate (LOPRESSOR) 25 MG tablet Take 25 mg by mouth in the morning and at bedtime. 09/18/20   [provider]  Multiple Vitamin (MULTIVITAMIN ADULT PO) Take by mouth. 05/30/20   [provider]  omeprazole (PRILOSEC) 20 MG capsule Take by mouth. 06/08/19   [provider]  rosuvastatin (CRESTOR) 40 MG tablet Take 40 mg by mouth daily. 05/12/20   [provider]    Allergies    Penicillins, Prednisone, and Sulfa antibiotics  Review of Systems   Review of Systems  Constitutional:  Negative for fever.  HENT:  Negative for ear pain.   Eyes:  Negative for pain.  Respiratory:  Positive for cough and shortness of breath.    Cardiovascular:  Negative for chest pain.  Gastrointestinal:  Negative for abdominal pain.  Genitourinary:  Negative for flank pain.  Musculoskeletal:  Negative for back pain.  Skin:  Negative for rash.  Neurological:  Negative for headaches.   Physical Exam Updated Vital Signs BP (!) 149/58   Pulse (!) 46   Temp 97.6 F (36.4 C)   Resp 18   Ht '5\' 3"'$  (1.6 m)   SpO2 99%   BMI 35.39 kg/m   Physical Exam Constitutional:      General: She is in acute distress.     Appearance: Normal appearance.  HENT:     Head: Normocephalic.     Nose: Nose normal.  Eyes:     Extraocular Movements: Extraocular movements intact.  Cardiovascular:     Rate and Rhythm: Normal rate.  Pulmonary:     Effort: Tachypnea and accessory muscle usage present.     Breath sounds: Decreased breath sounds and wheezing present.  Musculoskeletal:        General: Normal range of motion.     Cervical back: Normal range of motion.  Neurological:     General: No focal deficit present.     Mental Status: She is alert. Mental status is at baseline.    ED Results / Procedures / Treatments   Labs (all labs ordered are listed, but only abnormal results are displayed) Labs Reviewed  CBC WITH DIFFERENTIAL/PLATELET - Abnormal; Notable for the following components:      Result Value   RBC 3.32 (*)    Hemoglobin 9.9 (*)    HCT 31.6 (*)    RDW 15.9 (*)    Platelets 147 (*)    All other components within normal limits  BASIC METABOLIC PANEL - Abnormal; Notable for the following components:   Glucose, Bld 212 (*)    BUN 29 (*)    Creatinine, Ser 1.65 (*)    Calcium 8.5 (*)    GFR, Estimated 30 (*)    All other components within normal limits  BRAIN NATRIURETIC PEPTIDE - Abnormal; Notable for the following components:   B Natriuretic Peptide 299.0 (*)    All other components within normal limits  CULTURE, BLOOD (ROUTINE X 2)  RESP PANEL BY RT-PCR (FLU A&B, COVID) ARPGX2  CULTURE, BLOOD (ROUTINE X 2)   LACTIC ACID, PLASMA  LACTIC ACID, PLASMA  TROPONIN I (HIGH SENSITIVITY)  TROPONIN I (HIGH SENSITIVITY)    EKG  Sinus rhythm, normal rate, no ST elevations, no ST depressions.  Radiology DG Chest Port 1 View  Result Date: 11/08/2020 CLINICAL DATA:  Short of breath for 2 weeks, pain with inspiration EXAM: PORTABLE CHEST 1 VIEW COMPARISON:  None. FINDINGS: Single frontal view of the chest demonstrates an enlarged cardiac silhouette.  There is central vascular congestion with basilar predominant interstitial opacities and trace bilateral pleural effusions, most consistent with mild interstitial edema and volume overload. No pneumothorax. IMPRESSION: 1. Findings consistent with mild congestive heart failure and interstitial edema. Electronically Signed   By: Randa Ngo M.D.   On: 11/08/2020 15:56    Procedures .Critical Care  Date/Time: 11/08/2020 5:32 PM Performed by: Luna Fuse, MD Authorized by: Luna Fuse, MD   Critical care provider statement:    Critical care time (minutes):  30   Critical care time was exclusive of:  Separately billable procedures and treating other patients   Critical care was necessary to treat or prevent imminent or life-threatening deterioration of the following conditions:  Respiratory failure and cardiac failure Comments:     Concern for new hypoxemia possibly due to CHF   Medications Ordered in ED Medications  furosemide (LASIX) injection 40 mg (40 mg Intravenous Given 11/08/20 1624)  ipratropium-albuterol (DUONEB) 0.5-2.5 (3) MG/3ML nebulizer solution 3 mL (3 mLs Nebulization Given 11/08/20 1634)  methylPREDNISolone sodium succinate (SOLU-MEDROL) 125 mg/2 mL injection 60 mg (60 mg Intravenous Given 11/08/20 1624)    ED Course  I have reviewed the triage vital signs and the nursing notes.  Pertinent labs & imaging results that were available during my care of the patient were reviewed by me and considered in my medical decision making (see  chart for details).    MDM Rules/Calculators/A&P                           Patient presented significant respite distress.  She can only get 1 or 2 word sentences out.  Breath sounds are diminished bilaterally tight wheezes bilaterally.  Chest x-ray concerning for pulmonary congestion.  proBNP mildly elevated.  White count normal lactic acid normal no evidence of fevers or pneumonia.  Patient given IV Lasix, continues on O2 supplementation.  Will be brought into the hospitalist team.   Final Clinical Impression(s) / ED Diagnoses Final diagnoses:  Acute respiratory failure with hypoxia ([place])  Acute congestive heart failure, unspecified heart failure type [facility])    Rx / DC Orders ED Discharge Orders     None        Luna Fuse, MD 11/08/20 1734

## 2020-11-09 ENCOUNTER — Inpatient Hospital Stay (HOSPITAL_COMMUNITY): Payer: Medicare HMO

## 2020-11-09 ENCOUNTER — Encounter (HOSPITAL_COMMUNITY): Payer: Self-pay | Admitting: Internal Medicine

## 2020-11-09 DIAGNOSIS — N185 Chronic kidney disease, stage 5: Secondary | ICD-10-CM | POA: Diagnosis present

## 2020-11-09 DIAGNOSIS — J9601 Acute respiratory failure with hypoxia: Secondary | ICD-10-CM | POA: Diagnosis not present

## 2020-11-09 DIAGNOSIS — N184 Chronic kidney disease, stage 4 (severe): Secondary | ICD-10-CM | POA: Diagnosis present

## 2020-11-09 DIAGNOSIS — R0603 Acute respiratory distress: Secondary | ICD-10-CM | POA: Diagnosis not present

## 2020-11-09 LAB — CBC
HCT: 30 % — ABNORMAL LOW (ref 36.0–46.0)
Hemoglobin: 9.3 g/dL — ABNORMAL LOW (ref 12.0–15.0)
MCH: 29.2 pg (ref 26.0–34.0)
MCHC: 31 g/dL (ref 30.0–36.0)
MCV: 94.3 fL (ref 80.0–100.0)
Platelets: 137 10*3/uL — ABNORMAL LOW (ref 150–400)
RBC: 3.18 MIL/uL — ABNORMAL LOW (ref 3.87–5.11)
RDW: 15.6 % — ABNORMAL HIGH (ref 11.5–15.5)
WBC: 6.8 10*3/uL (ref 4.0–10.5)
nRBC: 0 % (ref 0.0–0.2)

## 2020-11-09 LAB — RETICULOCYTES
Immature Retic Fract: 15 % (ref 2.3–15.9)
RBC.: 3.2 MIL/uL — ABNORMAL LOW (ref 3.87–5.11)
Retic Count, Absolute: 64 10*3/uL (ref 19.0–186.0)
Retic Ct Pct: 2 % (ref 0.4–3.1)

## 2020-11-09 LAB — BASIC METABOLIC PANEL
Anion gap: 11 (ref 5–15)
BUN: 29 mg/dL — ABNORMAL HIGH (ref 8–23)
CO2: 27 mmol/L (ref 22–32)
Calcium: 9 mg/dL (ref 8.9–10.3)
Chloride: 106 mmol/L (ref 98–111)
Creatinine, Ser: 1.67 mg/dL — ABNORMAL HIGH (ref 0.44–1.00)
GFR, Estimated: 29 mL/min — ABNORMAL LOW (ref >60)
Glucose, Bld: 205 mg/dL — ABNORMAL HIGH (ref 70–99)
Potassium: 4.4 mmol/L (ref 3.5–5.1)
Sodium: 144 mmol/L (ref 135–145)

## 2020-11-09 LAB — IRON AND TIBC
Iron: 31 ug/dL (ref 28–170)
Saturation Ratios: 9 % — ABNORMAL LOW (ref 10.4–31.8)
TIBC: 328 ug/dL (ref 250–450)
UIBC: 297 ug/dL

## 2020-11-09 LAB — ECHOCARDIOGRAM COMPLETE
AR max vel: 1.63 cm2
AV Area VTI: 1.71 cm2
AV Area mean vel: 1.54 cm2
AV Mean grad: 7.8 mmHg
AV Peak grad: 15.6 mmHg
Ao pk vel: 1.97 m/s
Area-P 1/2: 5.38 cm2
Height: 63 in
S' Lateral: 4.2 cm
Weight: 3389.79 oz

## 2020-11-09 LAB — HEMOGLOBIN A1C
Hgb A1c MFr Bld: 7 % — ABNORMAL HIGH (ref 4.8–5.6)
Mean Plasma Glucose: 154.2 mg/dL

## 2020-11-09 LAB — VITAMIN B12: Vitamin B-12: 1492 pg/mL — ABNORMAL HIGH (ref 180–914)

## 2020-11-09 LAB — FOLATE: Folate: 74.5 ng/mL (ref >5.9)

## 2020-11-09 LAB — GLUCOSE, CAPILLARY
Glucose-Capillary: 187 mg/dL — ABNORMAL HIGH (ref 70–99)
Glucose-Capillary: 243 mg/dL — ABNORMAL HIGH (ref 70–99)
Glucose-Capillary: 224 mg/dL — ABNORMAL HIGH (ref 70–99)
Glucose-Capillary: 202 mg/dL — ABNORMAL HIGH (ref 70–99)

## 2020-11-09 LAB — FERRITIN: Ferritin: 51 ng/mL (ref 11–307)

## 2020-11-09 MED ORDER — CALCIUM CARBONATE-VITAMIN D 500-200 MG-UNIT PO TABS
2.0000 | ORAL_TABLET | Freq: Every day | ORAL | Status: DC
  Administered 2020-11-10 – 2020-11-11 (×2): 2 via ORAL
  Filled 2020-11-09 (×3): qty 2

## 2020-11-09 MED ORDER — CITALOPRAM HYDROBROMIDE 20 MG PO TABS
20.0000 mg | ORAL_TABLET | Freq: Every day | ORAL | Status: DC
  Administered 2020-11-09 – 2020-11-11 (×3): 20 mg via ORAL
  Filled 2020-11-09 (×3): qty 1

## 2020-11-09 MED ORDER — LEVOTHYROXINE SODIUM 25 MCG PO TABS
25.0000 ug | ORAL_TABLET | Freq: Every day | ORAL | Status: DC
  Administered 2020-11-10 – 2020-11-11 (×2): 25 ug via ORAL
  Filled 2020-11-09 (×2): qty 1

## 2020-11-09 MED ORDER — ASCORBIC ACID 500 MG PO TABS
1000.0000 mg | ORAL_TABLET | Freq: Every day | ORAL | Status: DC
  Administered 2020-11-09 – 2020-11-11 (×3): 1000 mg via ORAL
  Filled 2020-11-09 (×3): qty 2

## 2020-11-09 MED ORDER — AMLODIPINE BESYLATE 5 MG PO TABS
5.0000 mg | ORAL_TABLET | Freq: Every day | ORAL | Status: DC
  Administered 2020-11-09: 5 mg via ORAL
  Filled 2020-11-09 (×2): qty 1

## 2020-11-09 MED ORDER — ALPRAZOLAM 0.5 MG PO TABS: 0.5000 mg | ORAL_TABLET | Freq: Every evening | ORAL | Status: DC | PRN

## 2020-11-09 MED ORDER — GABAPENTIN 100 MG PO CAPS
100.0000 mg | ORAL_CAPSULE | Freq: Every day | ORAL | Status: DC
  Administered 2020-11-09 – 2020-11-10 (×2): 100 mg via ORAL
  Filled 2020-11-09 (×2): qty 1

## 2020-11-09 MED ORDER — CARBIDOPA-LEVODOPA ER 25-100 MG PO TBCR
1.0000 | EXTENDED_RELEASE_TABLET | Freq: Every day | ORAL | Status: DC
  Administered 2020-11-09 – 2020-11-11 (×3): 1 via ORAL
  Filled 2020-11-09 (×3): qty 1

## 2020-11-09 MED ORDER — VITAMIN D 25 MCG (1000 UNIT) PO TABS
5000.0000 [IU] | ORAL_TABLET | Freq: Every day | ORAL | Status: DC
  Administered 2020-11-09 – 2020-11-11 (×3): 5000 [IU] via ORAL
  Filled 2020-11-09 (×3): qty 5

## 2020-11-09 MED ORDER — GUAIFENESIN-DM 100-10 MG/5ML PO SYRP
5.0000 mL | ORAL_SOLUTION | ORAL | Status: DC | PRN
  Administered 2020-11-09: 5 mL via ORAL
  Filled 2020-11-09: qty 5

## 2020-11-09 MED ORDER — FOLIC ACID 1 MG PO TABS
500.0000 ug | ORAL_TABLET | Freq: Every day | ORAL | Status: DC
  Administered 2020-11-09 – 2020-11-11 (×3): 0.5 mg via ORAL
  Filled 2020-11-09 (×3): qty 1

## 2020-11-09 MED ORDER — PANTOPRAZOLE SODIUM 40 MG PO TBEC
40.0000 mg | DELAYED_RELEASE_TABLET | Freq: Every day | ORAL | Status: DC
  Administered 2020-11-09 – 2020-11-11 (×3): 40 mg via ORAL
  Filled 2020-11-09 (×3): qty 1

## 2020-11-09 MED ORDER — VITAMIN B-12 100 MCG PO TABS
500.0000 ug | ORAL_TABLET | Freq: Every day | ORAL | Status: DC
  Administered 2020-11-09 – 2020-11-11 (×3): 500 ug via ORAL
  Filled 2020-11-09 (×3): qty 5

## 2020-11-09 MED ORDER — ADULT MULTIVITAMIN W/MINERALS CH
1.0000 | ORAL_TABLET | Freq: Every day | ORAL | Status: DC
  Administered 2020-11-09 – 2020-11-11 (×3): 1 via ORAL
  Filled 2020-11-09 (×3): qty 1

## 2020-11-09 NOTE — Progress Notes (Addendum)
Patient Demographics:    Amy Kelly, is a 87 y.o. female, DOB - 1928-11-29, [id number]  Admit date - 11/08/2020   Admitting Physician Bethena Roys, MD  Outpatient Primary MD for the patient is Sherrilee Gilles, DO  LOS - 1   Chief Complaint  Patient presents with   Shortness of Breath        Subjective:    Amy Kelly today has no fevers, no emesis,  No chest pain, shob is Not worse -c/o fatigue, voiding well  Assessment  & Plan :    Principal Problem:   Acute respiratory failure with hypoxia ([place]) Active Problems:   CKD (chronic kidney disease), stage IV (HCC)   HTN (hypertension)   Bradycardia   Fluid overload   Hypothyroidism   Anxiety   Stroke ([place])   Anemia  Brief Summary:- 87 y.o. female with medical history significant for DM, Stroke, HTN, hypothyroidism, coronary artery disease admitted on 11/08/2020 with acute hypoxic respiratory failure secondary to presumed CHF  A/p  1)Acute Hypoxic Respiratory Failure secondary to Presumed CHF-- ??  Diastolic Versus systolic Dysfunction CHF -Echo pending -Continue IV Lasix for diuresis, daily weights and fluid input and output monitoring -Chest x-ray and BNP on admission suggestive of CHF --Lower extremity edema noted-venous Dopplers pending  2)AKI----acute kidney injury on CKD stage - IV -Creatinine currently around 1.6-1.7 -Monitor renal function closely with IV diuresis  renally adjust medications, avoid nephrotoxic agents / dehydration  / hypotension  3) chronic normocytic and normochromic anemia--- no evidence of ongoing bleeding, hemoglobin currently greater than 9 -B12 and folate are not low -Ferritin and serum iron are not low -Suspect partly related to underlying CKD -Consider EPO/ESA agent per protocol  4)HTN-- give Amlodipine 5 mg daily (was on 10 mg PTA) -Hold losartan and Cardura -`--- may use IV hydralazine as  needed elevated BP, -Discontinue metoprolol due to bradycardia which may be contributing to her dyspnea on exertion  5)DM2--- A1c 7.0 reflecting fair diabetic control,  -Continue to hold glimepiride -Continue Lantus 5 units every morning Use Novolog/Humalog Sliding scale insulin with Accu-Cheks/Fingersticks as ordered   6)H/o Prior CVA--okay to continue Plavix and crestor for secondary prevention  7)Depression/Anxiety----appears less anxious today, continue citalopram 20 mg daily - may use Xanax as needed for sleep  8)Hypothyroidism--TSH is 4.5 , continue levothyroxine 25 mcg daily (will not increase at this time)  9) generalized weakness and deconditioning/movement disorder --- continue Sinemet and gabapentin  --await physical therapy eval  Disposition/Need for in-Hospital Stay- patient unable to be discharged at this time due to -acute hypoxic respiratory failure secondary to presumed CHF requiring IV diuresis and monitoring of renal function, as well as supplemental oxygen  Status is: Inpatient  Remains inpatient appropriate because: Please see disposition above  Disposition: The patient is from: Home --apartment complex              Anticipated d/c is to:  Await physical therapy eval              Anticipated d/c date is: 2 days              Patient currently is not medically stable to d/c. Barriers: Not Clinically Stable-   Code Status :  -  Code Status: DNR   Family Communication:    (patient is alert, awake and coherent)   Consults  :  na  DVT Prophylaxis  :   - SCDs  enoxaparin (LOVENOX) injection 30 mg Start: 11/08/20 2115    Lab Results  Component Value Date   PLT 137 (L) 11/09/2020    Inpatient Medications  Scheduled Meds:  amLODipine  5 mg Oral Daily   ascorbic acid  1,000 mg Oral Daily   [START ON 11/10/2020] calcium-vitamin D  2 tablet Oral Q breakfast   Carbidopa-Levodopa ER  1 tablet Oral Daily   cholecalciferol  5,000 Units Oral Daily   citalopram   20 mg Oral Daily   clopidogrel  75 mg Oral Daily   enoxaparin (LOVENOX) injection  30 mg Subcutaneous [id number]   folic acid  [id number] mcg Oral Daily   furosemide  40 mg Intravenous Q12H   gabapentin  100 mg Oral QHS   insulin aspart  0-15 Units Subcutaneous TID WC   insulin aspart  0-5 Units Subcutaneous QHS   insulin glargine-yfgn  5 Units Subcutaneous Daily   [START ON 11/10/2020] levothyroxine  25 mcg Oral Q0600   multivitamin with minerals  1 tablet Oral Daily   pantoprazole  40 mg Oral Daily   pneumococcal 23 valent vaccine  0.5 mL Intramuscular Tomorrow-1000   rosuvastatin  40 mg Oral Daily   vitamin B-12  500 mcg Oral Daily   Continuous Infusions: PRN Meds:.acetaminophen **OR** acetaminophen, ALPRAZolam, guaiFENesin-dextromethorphan, hydrALAZINE, ondansetron **OR** ondansetron (ZOFRAN) IV, polyethylene glycol    Anti-infectives (From admission, onward)    None         Objective:   Vitals:   11/08/20 2344 11/09/20 0424 11/09/20 0657 11/09/20 1303  BP: (!) 175/57 140/63  (!) 150/78  Pulse: (!) 53 (!) 56  (!) 53  Resp: 19 20    Temp: (!) 97.5 F (36.4 C) 98.1 F (36.7 C)  98.2 F (36.8 C)  TempSrc: Oral Oral  Oral  SpO2: (!) 85% 90%  90%  Weight:   96.1 kg   Height:        Wt Readings from Last 3 Encounters:  11/09/20 96.1 kg  10/02/20 90.6 kg     Intake/Output Summary (Last 24 hours) at 11/09/2020 1327 Last data filed at 11/09/2020 0900 Gross per 24 hour  Intake 200 ml  Output 200 ml  Net 0 ml   Physical Exam  Gen:- Awake Alert, in no acute distress HEENT:- [place].AT, No sclera icterus Nose-  3 L/min Neck-Supple Neck,No JVD,.  Lungs-diminished breath sounds, no wheezing  CV- S1, S2 normal, regular  Abd-  +ve B.Sounds, Abd Soft, No tenderness,    Extremity/Skin:-1 + pitting edema, pedal pulses present  Psych-affect is appropriate, oriented x3 Neuro-generalized weakness, no new focal deficits, no tremors   Data Review:   Micro Results Recent Results  (from the past 240 hour(s))  Resp Panel by RT-PCR (Flu A&B, Covid) Nasopharyngeal Swab     Status: None   Collection Time: 11/08/20  3:24 PM   Specimen: Nasopharyngeal Swab; Nasopharyngeal(NP) swabs in vial transport medium  Result Value Ref Range Status   SARS Coronavirus 2 by RT PCR NEGATIVE NEGATIVE Final    Comment: (NOTE) SARS-CoV-2 target nucleic acids are NOT DETECTED.  The SARS-CoV-2 RNA is generally detectable in upper respiratory specimens during the acute phase of infection. The lowest concentration of SARS-CoV-2 viral copies this assay can detect is 138 copies/mL. A negative result does not preclude  SARS-Cov-2 infection and should not be used as the sole basis for treatment or other patient management decisions. A negative result may occur with  improper specimen collection/handling, submission of specimen other than nasopharyngeal swab, presence of viral mutation(s) within the areas targeted by this assay, and inadequate number of viral copies(<138 copies/mL). A negative result must be combined with clinical observations, patient history, and epidemiological information. The expected result is Negative.  Fact Sheet for Patients:  EntrepreneurPulse.com.au  Fact Sheet for Healthcare Providers:  IncredibleEmployment.be  This test is no t yet approved or cleared by the [place] FDA and  has been authorized for detection and/or diagnosis of SARS-CoV-2 by FDA under an Emergency Use Authorization (EUA). This EUA will remain  in effect (meaning this test can be used) for the duration of the COVID-19 declaration under Section 564(b)(1) of the Act, 21 U.S.C.section 360bbb-3(b)(1), unless the authorization is terminated  or revoked sooner.       Influenza A by PCR NEGATIVE NEGATIVE Final   Influenza B by PCR NEGATIVE NEGATIVE Final    Comment: (NOTE) The Xpert Xpress SARS-CoV-2/FLU/RSV plus assay is intended as an aid in the  diagnosis of influenza from Nasopharyngeal swab specimens and should not be used as a sole basis for treatment. Nasal washings and aspirates are unacceptable for Xpert Xpress SARS-CoV-2/FLU/RSV testing.  Fact Sheet for Patients: EntrepreneurPulse.com.au  Fact Sheet for Healthcare Providers: IncredibleEmployment.be  This test is not yet approved or cleared by the [place] FDA and has been authorized for detection and/or diagnosis of SARS-CoV-2 by FDA under an Emergency Use Authorization (EUA). This EUA will remain in effect (meaning this test can be used) for the duration of the COVID-19 declaration under Section 564(b)(1) of the Act, 21 U.S.C. section 360bbb-3(b)(1), unless the authorization is terminated or revoked.  Performed at [facility], [address] [place], [place] [address]   Culture, blood (routine x 2)     Status: None (Preliminary result)   Collection Time: 11/08/20  4:15 PM   Specimen: BLOOD RIGHT HAND  Result Value Ref Range Status   Specimen Description BLOOD RIGHT HAND  Final   Special Requests   Final    BOTTLES DRAWN AEROBIC AND ANAEROBIC Blood Culture results may not be optimal due to an excessive volume of blood received in culture bottles   Culture   Final    NO GROWTH < 24 HOURS Performed at [facility], [address], [place], [place] [address]    Report Status PENDING  Incomplete  Culture, blood (routine x 2)     Status: None (Preliminary result)   Collection Time: 11/08/20  4:36 PM   Specimen: BLOOD RIGHT ARM  Result Value Ref Range Status   Specimen Description BLOOD RIGHT ARM  Final   Special Requests   Final    BOTTLES DRAWN AEROBIC AND ANAEROBIC Blood Culture adequate volume   Culture   Final    NO GROWTH < 12 HOURS Performed at [facility], [address] [place], [place] [address]    Report Status PENDING  Incomplete    Radiology Reports DG Chest Port 1 View  Result Date: 11/08/2020 CLINICAL  DATA:  Short of breath for 2 weeks, pain with inspiration EXAM: PORTABLE CHEST 1 VIEW COMPARISON:  None. FINDINGS: Single frontal view of the chest demonstrates an enlarged cardiac silhouette. There is central vascular congestion with basilar predominant interstitial opacities and trace bilateral pleural effusions, most consistent with mild interstitial edema and volume overload. No pneumothorax.  IMPRESSION: 1. Findings consistent with mild congestive heart failure and interstitial edema. Electronically Signed   By: Randa Ngo M.D.   On: 11/08/2020 15:56     CBC Recent Labs  Lab 11/08/20 1523 11/09/20 0530  WBC 6.9 6.8  HGB 9.9* 9.3*  HCT 31.6* 30.0*  PLT 147* 137*  MCV 95.2 94.3  MCH 29.8 29.2  MCHC 31.3 31.0  RDW 15.9* 15.6*  LYMPHSABS 0.9  --   MONOABS 0.7  --   EOSABS 0.2  --   BASOSABS 0.1  --     Chemistries  Recent Labs  Lab 11/08/20 1523 11/08/20 1741 11/09/20 0530  NA 141  --  144  K 3.9  --  4.4  CL 110  --  106  CO2 25  --  27  GLUCOSE 212*  --  205*  BUN 29*  --  29*  CREATININE 1.65*  --  1.67*  CALCIUM 8.5*  --  9.0  MG  --  2.0  --    ------------------------------------------------------------------------------------------------------------------ No results for input(s): CHOL, HDL, LDLCALC, TRIG, CHOLHDL, LDLDIRECT in the last 72 hours.  Lab Results  Component Value Date   HGBA1C 7.0 (H) 11/09/2020   ------------------------------------------------------------------------------------------------------------------ Recent Labs    11/08/20 1516  TSH 4.565*   ------------------------------------------------------------------------------------------------------------------ Recent Labs    11/09/20 0530  VITAMINB12 1,492*  FOLATE 74.5  FERRITIN 51  TIBC 328  IRON 31  RETICCTPCT 2.0    Coagulation profile No results for input(s): INR, PROTIME in the last 168 hours.  No results for input(s): DDIMER in the last 72 hours.  Cardiac  Enzymes No results for input(s): CKMB, TROPONINI, MYOGLOBIN in the last 168 hours.  Invalid input(s): CK ------------------------------------------------------------------------------------------------------------------    Component Value Date/Time   BNP 299.0 (H) 11/08/2020 1523     Amy Kelly M.D on 11/09/2020 at 1:27 PM  Go to [email or web address] - for contact info  [facility] - Office  [phone]

## 2020-11-09 NOTE — Progress Notes (Signed)
Discussed with Dr. Roxan Hockey. D/C consult for dysphagia/regurgitation/history of esophageal dilations for now as no reported complaints. If needed, he will reorder consult or plan for outpatient evaluation.   Laureen Ochs. Bernarda Caffey [facility] [phone] 8/25/20222:38 PM

## 2020-11-09 NOTE — Progress Notes (Signed)
*  PRELIMINARY RESULTS* Echocardiogram 2D Echocardiogram has been performed.  Amy Kelly 11/09/2020, 4:27 PM

## 2020-11-10 ENCOUNTER — Inpatient Hospital Stay (HOSPITAL_COMMUNITY): Payer: Medicare HMO

## 2020-11-10 DIAGNOSIS — J9601 Acute respiratory failure with hypoxia: Secondary | ICD-10-CM | POA: Diagnosis not present

## 2020-11-10 LAB — BASIC METABOLIC PANEL
Anion gap: 9 (ref 5–15)
BUN: 38 mg/dL — ABNORMAL HIGH (ref 8–23)
CO2: 31 mmol/L (ref 22–32)
Calcium: 8.9 mg/dL (ref 8.9–10.3)
Chloride: 102 mmol/L (ref 98–111)
Creatinine, Ser: 1.74 mg/dL — ABNORMAL HIGH (ref 0.44–1.00)
GFR, Estimated: 28 mL/min — ABNORMAL LOW (ref >60)
Glucose, Bld: 95 mg/dL (ref 70–99)
Potassium: 3.4 mmol/L — ABNORMAL LOW (ref 3.5–5.1)
Sodium: 142 mmol/L (ref 135–145)

## 2020-11-10 LAB — GLUCOSE, CAPILLARY
Glucose-Capillary: 94 mg/dL (ref 70–99)
Glucose-Capillary: 150 mg/dL — ABNORMAL HIGH (ref 70–99)
Glucose-Capillary: 182 mg/dL — ABNORMAL HIGH (ref 70–99)
Glucose-Capillary: 206 mg/dL — ABNORMAL HIGH (ref 70–99)

## 2020-11-10 MED ORDER — FUROSEMIDE 10 MG/ML IJ SOLN
40.0000 mg | Freq: Every day | INTRAMUSCULAR | Status: DC
  Filled 2020-11-10: qty 4

## 2020-11-10 MED ORDER — POTASSIUM CHLORIDE CRYS ER 20 MEQ PO TBCR
40.0000 meq | EXTENDED_RELEASE_TABLET | ORAL | Status: AC
  Administered 2020-11-10 (×2): 40 meq via ORAL
  Filled 2020-11-10 (×2): qty 2

## 2020-11-10 MED ORDER — AMLODIPINE BESYLATE 5 MG PO TABS
10.0000 mg | ORAL_TABLET | Freq: Every day | ORAL | Status: DC
  Administered 2020-11-10 – 2020-11-11 (×2): 10 mg via ORAL
  Filled 2020-11-10: qty 2

## 2020-11-10 MED ORDER — AMLODIPINE BESYLATE 5 MG PO TABS: 5.0000 mg | ORAL_TABLET | Freq: Once | ORAL | Status: DC

## 2020-11-10 NOTE — Evaluation (Signed)
Physical Therapy Evaluation Patient Details Name: Amy Kelly MRN: [id number] DOB: February 20, 1929 Today's Date: 11/10/2020   History of Present Illness  [name] is a 87 y.o. female with medical history significant for DM, Stroke, HTN, hypothyroidism, coronary artery disease.  Patient presented to the ED with complaints of difficulty breathing of 2 weeks duration.  Patient's son [name] is at bedside and helps with the history.  She reports breathing is worse with exertion.  Chronic stable orthopnea.  She has chronic bilateral lower extremity swelling that has been intermittent over the past year at least, she is unsure if it is worse.  She reports chest tightness due to difficulty breathing without actual chest pain that started today.  Son at bedside reports patient has anxiety and panic attacks, and feels this is why she was having the chest tightness.  She does not know if she has gained weight, and is unsure of her baseline weight.  At baseline patient is not very active.   Clinical Impression  Patient limited for functional mobility as stated below secondary to BLE weakness, fatigue and impaired standing standing balance. Patient on 2.5 L O2 during session and was as follows: seated at rest following bed mobility 85-89%, after several minutes of sitting EOB 92-95%, with ambulation 88-95% without SOB but with fatigue noted. Patient does not require assist with bed mobility or transfers but does require use of RW during session for LE weakness and balance. Patient ends session seated in chair. Patient will benefit from continued physical therapy in hospital and recommended venue below to increase strength, balance, endurance for safe ADLs and gait.     Follow Up Recommendations Home health PT    Equipment Recommendations  None recommended by PT    Recommendations for Other Services       Precautions / Restrictions Precautions Precautions: Fall Restrictions Weight Bearing Restrictions: No       Mobility  Bed Mobility Overal bed mobility: Modified Independent             General bed mobility comments: slightly slow with HOB elevated    Transfers Overall transfer level: Needs assistance Equipment used: Rolling walker (2 wheeled) Transfers: Sit to/from [facility] Sit to Stand: Supervision Stand pivot transfers: Supervision       General transfer comment: transfer from sitting to standing with RW  Ambulation/Gait Ambulation/Gait assistance: Supervision;Min guard Gait Distance (Feet): 40 Feet Assistive device: Rolling walker (2 wheeled) Gait Pattern/deviations: Decreased stride length;Step-through pattern Gait velocity: decreased   General Gait Details: slightly slow and minimally unsteady cadence with RW without loss of balance  Stairs            Wheelchair Mobility    Modified Rankin (Stroke Patients Only)       Balance Overall balance assessment: Needs assistance Sitting-balance support: No upper extremity supported;Feet supported Sitting balance-Leahy Scale: Normal Sitting balance - Comments: seated EOB   Standing balance support: Bilateral upper extremity supported Standing balance-Leahy Scale: Fair Standing balance comment: fair/good with RW                             Pertinent Vitals/Pain Pain Assessment: No/denies pain    Home Living Family/patient expects to be discharged to:: Private residence Living Arrangements: Alone (son lives next door) Available Help at Discharge: Family Type of Home: Apartment Home Access: Stairs to [employer] of Steps: 3rd story apt - patient uses elevator Home Layout: One  level Home Equipment: Walker - 4 wheels;Shower seat      Prior Function Level of Independence: Needs assistance   Gait / Transfers Assistance Needed: patient states short distance community ambulator with rollator  ADL's / Homemaking Assistance Needed: son assists  PRN        Hand Dominance        Extremity/Trunk Assessment   Upper Extremity Assessment Upper Extremity Assessment: Overall WFL for tasks assessed    Lower Extremity Assessment Lower Extremity Assessment: Generalized weakness (LLE deficit > RLE)    Cervical / Trunk Assessment Cervical / Trunk Assessment: Normal  Communication   Communication: No difficulties;HOH  Cognition Arousal/Alertness: Awake/alert Behavior During Therapy: WFL for tasks assessed/performed Overall Cognitive Status: Within Functional Limits for tasks assessed                                        General Comments      Exercises     Assessment/Plan    PT Assessment Patient needs continued PT services  PT Problem List Decreased strength;Decreased mobility;Decreased activity tolerance;Decreased balance       PT Treatment Interventions DME instruction;Therapeutic exercise;Gait training;Balance training;Stair training;Neuromuscular re-education;Functional mobility training;Therapeutic activities;Patient/family education    PT Goals (Current goals can be found in the Care Plan section)  Acute Rehab PT Goals Patient Stated Goal: Return home PT Goal Formulation: With patient Time For Goal Achievement: 11/17/20 Potential to Achieve Goals: Good    Frequency Min 3X/week   Barriers to discharge        Co-evaluation               AM-PAC PT "6 Clicks" Mobility  Outcome Measure Help needed turning from your back to your side while in a flat bed without using bedrails?: None Help needed moving from lying on your back to sitting on the side of a flat bed without using bedrails?: None Help needed moving to and from a bed to a chair (including a wheelchair)?: A Little Help needed standing up from a chair using your arms (e.g., wheelchair or bedside chair)?: A Little Help needed to walk in hospital room?: A Little Help needed climbing 3-5 steps with a railing? : A Lot 6 Click  Score: 19    End of Session Equipment Utilized During Treatment: Gait belt Activity Tolerance: Patient tolerated treatment well Patient left: in chair;with call bell/phone within reach Nurse Communication: Mobility status PT Visit Diagnosis: Unsteadiness on feet (R26.81);Other abnormalities of gait and mobility (R26.89);Muscle weakness (generalized) (M62.81)    Time: [id number] PT Time Calculation (min) (ACUTE ONLY): 31 min   Charges:   PT Evaluation $PT Eval Low Complexity: 1 Low PT Treatments $Therapeutic Activity: 23-37 mins        9:22 AM, 11/10/20 Mearl Latin PT, DPT Physical Therapist at [facility]

## 2020-11-10 NOTE — Progress Notes (Signed)
Patient Demographics:    Amy Kelly, is a 87 y.o. female, DOB - Sep 23, 1928, ZN:[id number]  Admit date - 11/08/2020   Admitting Physician Bethena Roys, MD  Outpatient Primary MD for the patient is Sherrilee Gilles, DO  LOS - 2   Chief Complaint  Patient presents with   Shortness of Breath        Subjective:    Amy Kelly today has no fevers, no emesis,  No chest pain, shob is Not worse -voiding well--- urine output not documented because patient voids directly into the toilet Son Amy Kelly at bedside  Assessment  & Plan :    Principal Problem:   Acute respiratory failure with hypoxia ([place]) Active Problems:   CKD (chronic kidney disease), stage IV (HCC)   HTN (hypertension)   Bradycardia   Fluid overload   Hypothyroidism   Anxiety   Stroke [facility])   Anemia  Brief Summary:- 87 y.o. female with medical history significant for DM, Stroke, HTN, hypothyroidism, coronary artery disease admitted on 11/08/2020 with acute hypoxic respiratory failure secondary to presumed HFpEF  A/p  1)Acute Hypoxic Respiratory Failure secondary to HFpEF -Echo with EF of 55 to 60% with moderate pulmonary hypertension suspect chronic diastolic dysfunction --Patient qualifies for home O2 at 2 L/min via nasal cannula continuously -Creatinine and BUN trending up, change Lasix to 40 mg IV daily from twice daily -Monitor daily weights and fluid input and output monitoring -Chest x-ray and BNP on admission suggestive of CHF --Lower extremity edema noted-venous Dopplers pending -Urine output on in and out fluid documentation and does not appear to be accurate  2)AKI----acute kidney injury on CKD stage - IV -Creatinine is up to 1.74 from 1.65, BUN is up to 38 from 29 -Monitor renal function closely with ongoing IV diuresis  renally adjust medications, avoid nephrotoxic agents / dehydration  / hypotension  3)Chronic  Normocytic and normochromic Anemia--- no evidence of ongoing bleeding, hemoglobin currently greater than 9 -B12 and folate are not low -Ferritin and serum iron are not low -Suspect partly related to underlying CKD -Consider EPO/ESA agent per protocol -Discussed with GI service patient is practically 87 years old--no endoluminal evaluation planned unless H&H drops significantly or evidence of active bleeding  4)HTN--BP running higher, okay to increase amlodipine back to 10 mg which patient was taking PTA -Hold losartan and Cardura -`--- may use IV hydralazine as needed elevated BP, -Discontinue metoprolol due to bradycardia which may be contributing to her dyspnea on exertion  5)DM2--- A1c 7.0 reflecting fair diabetic control,  -Continue to hold glimepiride -Continue Lantus 5 units every morning Use Novolog/Humalog Sliding scale insulin with Accu-Cheks/Fingersticks as ordered   6)H/o Prior CVA--okay to continue Plavix and crestor for secondary prevention  7)Depression/Anxiety----appears less anxious today, continue citalopram 20 mg daily - may use Xanax as needed for sleep  8)Hypothyroidism--TSH is 4.5 , continue levothyroxine 25 mcg daily (will not increase at this time)  9) generalized weakness and deconditioning/movement disorder --- continue Sinemet and gabapentin  -Physical therapist recommends home health PT  Disposition/Need for in-Hospital Stay- patient unable to be discharged at this time due to -acute hypoxic respiratory failure secondary to presumed CHF requiring IV diuresis and monitoring of renal function, as well as supplemental oxygen  Status is: Inpatient  Remains inpatient appropriate because: Please see disposition above  Disposition: The patient is from: Home --apartment complex              Anticipated d/c is to: Home with [facility]              Anticipated d/c date is: 1 day              Patient currently is not medically stable to d/c. Barriers: Not Clinically  Stable-   Code Status :  -  Code Status: DNR   Family Communication:    (patient is alert, awake and coherent)   Consults  :  na  DVT Prophylaxis  :   - SCDs  enoxaparin (LOVENOX) injection 30 mg Start: 11/08/20 2115  Lab Results  Component Value Date   PLT 137 (L) 11/09/2020    Inpatient Medications  Scheduled Meds:  amLODipine  10 mg Oral Daily   ascorbic acid  1,000 mg Oral Daily   calcium-vitamin D  2 tablet Oral Q breakfast   Carbidopa-Levodopa ER  1 tablet Oral Daily   cholecalciferol  5,000 Units Oral Daily   citalopram  20 mg Oral Daily   clopidogrel  75 mg Oral Daily   enoxaparin (LOVENOX) injection  30 mg Subcutaneous [id number]   folic acid  [id number] mcg Oral Daily   [START ON 11/11/2020] furosemide  40 mg Intravenous Daily   gabapentin  100 mg Oral QHS   insulin aspart  0-15 Units Subcutaneous TID WC   insulin aspart  0-5 Units Subcutaneous QHS   insulin glargine-yfgn  5 Units Subcutaneous Daily   levothyroxine  25 mcg Oral Q0600   multivitamin with minerals  1 tablet Oral Daily   pantoprazole  40 mg Oral Daily   pneumococcal 23 valent vaccine  0.5 mL Intramuscular Tomorrow-1000   rosuvastatin  40 mg Oral Daily   vitamin B-12  500 mcg Oral Daily   Continuous Infusions: PRN Meds:.acetaminophen **OR** acetaminophen, ALPRAZolam, guaiFENesin-dextromethorphan, hydrALAZINE, ondansetron **OR** ondansetron (ZOFRAN) IV, polyethylene glycol    Anti-infectives (From admission, onward)    None         Objective:   Vitals:   11/10/20 0434 11/10/20 0500 11/10/20 0814 11/10/20 1328  BP: (!) 165/54  (!) 154/47 (!) 142/63  Pulse: (!) 54  61 62  Resp: 18  20   Temp: 98 F (36.7 C)  97.6 F (36.4 C) 97.6 F (36.4 C)  TempSrc: Oral  Oral Oral  SpO2: 95%  97% 96%  Weight:  88.1 kg    Height:        Wt Readings from Last 3 Encounters:  11/10/20 88.1 kg  10/02/20 90.6 kg     Intake/Output Summary (Last 24 hours) at 11/10/2020 1535 Last data filed at 11/10/2020  1300 Gross per 24 hour  Intake 2360 ml  Output 450 ml  Net 1910 ml   Physical Exam  Gen:- Awake Alert, in no acute distress HEENT:- [place].AT, No sclera icterus Nose-  2 L/min Neck-Supple Neck,No JVD,.  Lungs-improving air movement, no wheeze occasional rhonchi CV- S1, S2 normal, regular  Abd-  +ve B.Sounds, Abd Soft, No tenderness,    Extremity/Skin:-1 + pitting edema, pedal pulses present  Psych-affect is appropriate, oriented x3 Neuro-generalized weakness, no new focal deficits, no tremors   Data Review:   Micro Results Recent Results (from the past 240 hour(s))  Resp Panel by RT-PCR (Flu A&B, Covid) Nasopharyngeal Swab     Status:  None   Collection Time: 11/08/20  3:24 PM   Specimen: Nasopharyngeal Swab; Nasopharyngeal(NP) swabs in vial transport medium  Result Value Ref Range Status   SARS Coronavirus 2 by RT PCR NEGATIVE NEGATIVE Final    Comment: (NOTE) SARS-CoV-2 target nucleic acids are NOT DETECTED.  The SARS-CoV-2 RNA is generally detectable in upper respiratory specimens during the acute phase of infection. The lowest concentration of SARS-CoV-2 viral copies this assay can detect is 138 copies/mL. A negative result does not preclude SARS-Cov-2 infection and should not be used as the sole basis for treatment or other patient management decisions. A negative result may occur with  improper specimen collection/handling, submission of specimen other than nasopharyngeal swab, presence of viral mutation(s) within the areas targeted by this assay, and inadequate number of viral copies(<138 copies/mL). A negative result must be combined with clinical observations, patient history, and epidemiological information. The expected result is Negative.  Fact Sheet for Patients:  EntrepreneurPulse.com.au  Fact Sheet for Healthcare Providers:  IncredibleEmployment.be  This test is no t yet approved or cleared by the [place] FDA and   has been authorized for detection and/or diagnosis of SARS-CoV-2 by FDA under an Emergency Use Authorization (EUA). This EUA will remain  in effect (meaning this test can be used) for the duration of the COVID-19 declaration under Section 564(b)(1) of the Act, 21 U.S.C.section 360bbb-3(b)(1), unless the authorization is terminated  or revoked sooner.       Influenza A by PCR NEGATIVE NEGATIVE Final   Influenza B by PCR NEGATIVE NEGATIVE Final    Comment: (NOTE) The Xpert Xpress SARS-CoV-2/FLU/RSV plus assay is intended as an aid in the diagnosis of influenza from Nasopharyngeal swab specimens and should not be used as a sole basis for treatment. Nasal washings and aspirates are unacceptable for Xpert Xpress SARS-CoV-2/FLU/RSV testing.  Fact Sheet for Patients: EntrepreneurPulse.com.au  Fact Sheet for Healthcare Providers: IncredibleEmployment.be  This test is not yet approved or cleared by the [place] FDA and has been authorized for detection and/or diagnosis of SARS-CoV-2 by FDA under an Emergency Use Authorization (EUA). This EUA will remain in effect (meaning this test can be used) for the duration of the COVID-19 declaration under Section 564(b)(1) of the Act, 21 U.S.C. section 360bbb-3(b)(1), unless the authorization is terminated or revoked.  Performed at [facility], [address], [place], [place] [address]   Culture, blood (routine x 2)     Status: None (Preliminary result)   Collection Time: 11/08/20  4:15 PM   Specimen: BLOOD RIGHT HAND  Result Value Ref Range Status   Specimen Description BLOOD RIGHT HAND  Final   Special Requests   Final    BOTTLES DRAWN AEROBIC AND ANAEROBIC Blood Culture results may not be optimal due to an excessive volume of blood received in culture bottles   Culture   Final    NO GROWTH 2 DAYS Performed at [facility], [address], [place], [place] [address]    Report Status PENDING   Incomplete  Culture, blood (routine x 2)     Status: None (Preliminary result)   Collection Time: 11/08/20  4:36 PM   Specimen: BLOOD RIGHT ARM  Result Value Ref Range Status   Specimen Description BLOOD RIGHT ARM  Final   Special Requests   Final    BOTTLES DRAWN AEROBIC AND ANAEROBIC Blood Culture adequate volume   Culture   Final    NO GROWTH 2 DAYS Performed at [facility], [place]  [address] [place],  [address]    Report Status PENDING  Incomplete    Radiology Reports DG Chest Port 1 View  Result Date: 11/08/2020 CLINICAL DATA:  Short of breath for 2 weeks, pain with inspiration EXAM: PORTABLE CHEST 1 VIEW COMPARISON:  None. FINDINGS: Single frontal view of the chest demonstrates an enlarged cardiac silhouette. There is central vascular congestion with basilar predominant interstitial opacities and trace bilateral pleural effusions, most consistent with mild interstitial edema and volume overload. No pneumothorax. IMPRESSION: 1. Findings consistent with mild congestive heart failure and interstitial edema. Electronically Signed   By: Randa Ngo M.D.   On: 11/08/2020 15:56   ECHOCARDIOGRAM COMPLETE  Result Date: 11/09/2020    ECHOCARDIOGRAM REPORT   Patient Name:   Amy Kelly Date of Exam: 11/09/2020 Medical Rec #:  [id number]   Height:       63.0 in Accession #:    [id number]  Weight:       211.9 lb Date of Birth:  1928-04-01   BSA:          1.982 m Patient Age:    89 years    BP:           140/63 mmHg Patient Gender: F           HR:           56 bpm. Exam Location:  [facility] Procedure: 2D Echo, Cardiac Doppler and Color Doppler Indications:    [place] respiratory distress R06.03  History:        Patient has no prior history of Echocardiogram examinations.                 CAD, Stroke; Risk Factors:Hypertension and Dyslipidemia. History                 of skin cancer (From Hx).  Sonographer:    Alvino Chapel RCS Referring Phys: [phone] G[place]  1. Left  ventricular ejection fraction, by estimation, is 55 to 60%. The left ventricle has normal function. The left ventricle has no regional wall motion abnormalities. Left ventricular diastolic parameters are indeterminate.  2. Right ventricular systolic function is normal. The right ventricular size is normal. There is moderately elevated pulmonary artery systolic pressure.  3. Left atrial size was moderately dilated.  4. Right atrial size was mildly dilated.  5. The mitral valve is normal in structure. Trivial mitral valve regurgitation. No evidence of mitral stenosis.  6. The tricuspid valve is abnormal.  7. The aortic valve was not well visualized. There is moderate calcification of the aortic valve. There is moderate thickening of the aortic valve. Aortic valve regurgitation is not visualized. No aortic stenosis is present.  8. The inferior vena cava is dilated in size with >50% respiratory variability, suggesting right atrial pressure of 8 mmHg. FINDINGS  Left Ventricle: Left ventricular ejection fraction, by estimation, is 55 to 60%. The left ventricle has normal function. The left ventricle has no regional wall motion abnormalities. The left ventricular internal cavity size was normal in size. There is  no left ventricular hypertrophy. Left ventricular diastolic parameters are indeterminate. Right Ventricle: The right ventricular size is normal. No increase in right ventricular wall thickness. Right ventricular systolic function is normal. There is moderately elevated pulmonary artery systolic pressure. The tricuspid regurgitant velocity is 3.26 m/s, and with an assumed right atrial pressure of 8 mmHg, the estimated right ventricular systolic pressure is 123XX123 mmHg. Left Atrium: Left atrial size was moderately  dilated. Right Atrium: Right atrial size was mildly dilated. Pericardium: There is no evidence of pericardial effusion. Mitral Valve: The mitral valve is normal in structure. There is mild thickening of the  mitral valve leaflet(s). There is mild calcification of the mitral valve leaflet(s). Mild mitral annular calcification. Trivial mitral valve regurgitation. No evidence  of mitral valve stenosis. Tricuspid Valve: The tricuspid valve is abnormal. Tricuspid valve regurgitation is mild . No evidence of tricuspid stenosis. Aortic Valve: The aortic valve was not well visualized. There is moderate calcification of the aortic valve. There is moderate thickening of the aortic valve. There is moderate aortic valve annular calcification. Aortic valve regurgitation is not visualized. No aortic stenosis is present. Aortic valve mean gradient measures 7.8 mmHg. Aortic valve peak gradient measures 15.6 mmHg. Aortic valve area, by VTI measures 1.71 cm. Pulmonic Valve: The pulmonic valve was not well visualized. Pulmonic valve regurgitation is not visualized. No evidence of pulmonic stenosis. Aorta: The aortic root is normal in size and structure. Venous: The inferior vena cava is dilated in size with greater than 50% respiratory variability, suggesting right atrial pressure of 8 mmHg. IAS/Shunts: No atrial level shunt detected by color flow Doppler.  LEFT VENTRICLE PLAX 2D LVIDd:         5.90 cm  Diastology LVIDs:         4.20 cm  LV e' medial:    5.44 cm/s LV PW:         1.00 cm  LV E/e' medial:  28.7 LV IVS:        0.90 cm  LV e' lateral:   8.70 cm/s LVOT diam:     1.70 cm  LV E/e' lateral: 17.9 LV SV:         84 LV SV Index:   42 LVOT Area:     2.27 cm  RIGHT VENTRICLE RV S prime:     10.30 cm/s TAPSE (M-mode): 2.1 cm LEFT ATRIUM             Index       RIGHT ATRIUM           Index LA diam:        4.30 cm 2.17 cm/m  RA Area:     21.20 cm LA Vol (A2C):   83.3 ml 42.04 ml/m RA Volume:   66.90 ml  33.76 ml/m LA Vol (A4C):   85.3 ml 43.05 ml/m LA Biplane Vol: 87.4 ml 44.11 ml/m  AORTIC VALVE AV Area (Vmax):    1.63 cm AV Area (Vmean):   1.54 cm AV Area (VTI):     1.71 cm AV Vmax:           197.50 cm/s AV Vmean:           129.765 cm/s AV VTI:            0.489 m AV Peak Grad:      15.6 mmHg AV Mean Grad:      7.8 mmHg LVOT Vmax:         142.00 cm/s LVOT Vmean:        88.300 cm/s LVOT VTI:          0.369 m LVOT/AV VTI ratio: 0.75  AORTA Ao Root diam: 3.20 cm MITRAL VALVE                TRICUSPID VALVE MV Area (PHT): 5.38 cm     TR Peak grad:   42.5 mmHg MV Decel Time: 141  msec     TR Vmax:        326.00 cm/s MV E velocity: 156.00 cm/s MV A velocity: 144.00 cm/s  SHUNTS MV E/A ratio:  1.08         Systemic VTI:  0.37 m                             Systemic Diam: 1.70 cm Carlyle Dolly MD Electronically signed by Carlyle Dolly MD Signature Date/Time: 11/09/2020/4:42:57 PM    Final      CBC Recent Labs  Lab 11/08/20 1523 11/09/20 0530  WBC 6.9 6.8  HGB 9.9* 9.3*  HCT 31.6* 30.0*  PLT 147* 137*  MCV 95.2 94.3  MCH 29.8 29.2  MCHC 31.3 31.0  RDW 15.9* 15.6*  LYMPHSABS 0.9  --   MONOABS 0.7  --   EOSABS 0.2  --   BASOSABS 0.1  --     Chemistries  Recent Labs  Lab 11/08/20 1523 11/08/20 1741 11/09/20 0530 11/10/20 0539  NA 141  --  144 142  K 3.9  --  4.4 3.4*  CL 110  --  106 102  CO2 25  --  27 31  GLUCOSE 212*  --  205* 95  BUN 29*  --  29* 38*  CREATININE 1.65*  --  1.67* 1.74*  CALCIUM 8.5*  --  9.0 8.9  MG  --  2.0  --   --    ------------------------------------------------------------------------------------------------------------------ No results for input(s): CHOL, HDL, LDLCALC, TRIG, CHOLHDL, LDLDIRECT in the last 72 hours.  Lab Results  Component Value Date   HGBA1C 7.0 (H) 11/09/2020   ------------------------------------------------------------------------------------------------------------------ Recent Labs    11/08/20 1516  TSH 4.565*   ------------------------------------------------------------------------------------------------------------------ Recent Labs    11/09/20 0530  VITAMINB12 1,492*  FOLATE 74.5  FERRITIN 51  TIBC 328  IRON 31  RETICCTPCT 2.0     Coagulation profile No results for input(s): INR, PROTIME in the last 168 hours.  No results for input(s): DDIMER in the last 72 hours.  Cardiac Enzymes No results for input(s): CKMB, TROPONINI, MYOGLOBIN in the last 168 hours.  Invalid input(s): CK ------------------------------------------------------------------------------------------------------------------    Component Value Date/Time   BNP 299.0 (H) 11/08/2020 1523     Roxan Hockey M.D on 11/10/2020 at 3:35 PM  Go to [email or web address] - for contact info  [facility] - Office  [phone]

## 2020-11-10 NOTE — Plan of Care (Signed)
  Problem: Acute Rehab PT Goals(only PT should resolve) Goal: Patient Will Transfer Sit To/From Stand Outcome: Progressing Flowsheets (Taken 11/10/2020 [phone]) Patient will transfer sit to/from stand: with modified independence Goal: Pt Will Transfer Bed To Chair/Chair To Bed Outcome: Progressing Flowsheets (Taken 11/10/2020 0924) Pt will Transfer Bed to Chair/Chair to Bed: with modified independence Goal: Pt Will Ambulate Outcome: Progressing Flowsheets (Taken 11/10/2020 0924) Pt will Ambulate:  50 feet  with modified independence  with least restrictive assistive device Goal: Pt/caregiver will Perform Home Exercise Program Outcome: Progressing Flowsheets (Taken 11/10/2020 0924) Pt/caregiver will Perform Home Exercise Program:  For increased strengthening  For improved balance  Independently  9:24 AM, 11/10/20 Mearl Latin PT, DPT Physical Therapist at [facility]

## 2020-11-10 NOTE — Progress Notes (Signed)
SATURATION QUALIFICATIONS: (This note is used to comply with regulatory documentation for home oxygen)  Patient Saturations on Room Air at Rest = 92%  Patient Saturations on Room Air while Ambulating = 87%  Patient Saturations on 2 Liters of oxygen while Ambulating = 91%  Please briefly explain why patient needs home oxygen: pt drops down to 87 while ambulating on roomair.

## 2020-11-11 DIAGNOSIS — J9601 Acute respiratory failure with hypoxia: Secondary | ICD-10-CM | POA: Diagnosis not present

## 2020-11-11 DIAGNOSIS — I5033 Acute on chronic diastolic (congestive) heart failure: Secondary | ICD-10-CM | POA: Diagnosis present

## 2020-11-11 LAB — CBC
HCT: 32.3 % — ABNORMAL LOW (ref 36.0–46.0)
Hemoglobin: 10.2 g/dL — ABNORMAL LOW (ref 12.0–15.0)
MCH: 29.1 pg (ref 26.0–34.0)
MCHC: 31.6 g/dL (ref 30.0–36.0)
MCV: 92 fL (ref 80.0–100.0)
Platelets: 182 10*3/uL (ref 150–400)
RBC: 3.51 MIL/uL — ABNORMAL LOW (ref 3.87–5.11)
RDW: 15.4 % (ref 11.5–15.5)
WBC: 8.5 10*3/uL (ref 4.0–10.5)
nRBC: 0 % (ref 0.0–0.2)

## 2020-11-11 LAB — GLUCOSE, CAPILLARY
Glucose-Capillary: 171 mg/dL — ABNORMAL HIGH (ref 70–99)
Glucose-Capillary: 225 mg/dL — ABNORMAL HIGH (ref 70–99)

## 2020-11-11 LAB — RENAL FUNCTION PANEL
Albumin: 3 g/dL — ABNORMAL LOW (ref 3.5–5.0)
Anion gap: 10 (ref 5–15)
BUN: 40 mg/dL — ABNORMAL HIGH (ref 8–23)
CO2: 31 mmol/L (ref 22–32)
Calcium: 8.9 mg/dL (ref 8.9–10.3)
Chloride: 100 mmol/L (ref 98–111)
Creatinine, Ser: 1.66 mg/dL — ABNORMAL HIGH (ref 0.44–1.00)
GFR, Estimated: 29 mL/min — ABNORMAL LOW (ref >60)
Glucose, Bld: 183 mg/dL — ABNORMAL HIGH (ref 70–99)
Phosphorus: 3.3 mg/dL (ref 2.5–4.6)
Potassium: 4.2 mmol/L (ref 3.5–5.1)
Sodium: 141 mmol/L (ref 135–145)

## 2020-11-11 MED ORDER — LEVOTHYROXINE SODIUM 25 MCG PO TABS: 25.0000 ug | ORAL_TABLET | Freq: Every day | ORAL | 5 refills | Status: DC

## 2020-11-11 MED ORDER — CARVEDILOL 6.25 MG PO TABS: 6.2500 mg | ORAL_TABLET | Freq: Two times a day (BID) | ORAL | 5 refills | Status: DC

## 2020-11-11 MED ORDER — POTASSIUM CHLORIDE ER 10 MEQ PO TBCR: 10.0000 meq | EXTENDED_RELEASE_TABLET | Freq: Every day | ORAL | 2 refills | Status: DC

## 2020-11-11 MED ORDER — FUROSEMIDE 40 MG PO TABS
40.0000 mg | ORAL_TABLET | Freq: Two times a day (BID) | ORAL | Status: DC
  Administered 2020-11-11: 40 mg via ORAL
  Filled 2020-11-11: qty 1

## 2020-11-11 MED ORDER — FUROSEMIDE 40 MG PO TABS: 40.0000 mg | ORAL_TABLET | Freq: Every morning | ORAL | 5 refills | Status: DC

## 2020-11-11 NOTE — Progress Notes (Signed)
Patient and son state understanding of discharge instructions, O2 set up at home and patient has portable tank.

## 2020-11-11 NOTE — Discharge Summary (Signed)
Amy Kelly, is a 87 y.o. female  DOB 03-02-1929  MRN [id number].  Admission date:  11/08/2020  Admitting Physician  Bethena Roys, MD  Discharge Date:  11/11/2020   Primary MD  Sherrilee Gilles, DO  Recommendations for primary care physician for things to follow:   1)Very low-salt diet advised- less than 2 g of sodium per day 2)Weigh yourself daily, call if you gain more than 3 pounds in 1 day or more than 5 pounds in 1 week as your diuretic medications may need to be adjusted 3)Limit your Fluid  intake to no more than 60 ounces (1.8 Liters) per day 4)Avoid ibuprofen/Advil/Aleve/Motrin/Goody Powders/Naproxen/BC powders/Meloxicam/Diclofenac/Indomethacin and other Nonsteroidal anti-inflammatory medications as these will make you more likely to bleed and can cause stomach ulcers, can also cause Kidney problems.  5)You need oxygen at home at 2 L via nasal cannula continuously while awake and while asleep--- smoking or having open fires around oxygen can cause fire, significant injury and death 6)follow up with Sherrilee Gilles, DO (PCP) in about a week for recheck of your BMP blood test 7)Please use TED compression stockings (Knee High)--placed them on in the morning and you can take them off at bedtime   Admission Diagnosis  Acute respiratory failure with hypoxia ([place]) [J96.01] Acute congestive heart failure, unspecified heart failure type ([place]) [I50.9]   Discharge Diagnosis  Acute respiratory failure with hypoxia ([place]) [J96.01] Acute congestive heart failure, unspecified heart failure type ([place]) [I50.9]    Principal Problem:   Acute respiratory failure with hypoxia ([place]) Active Problems:   Acute on chronic Diastolic heart failure with preserved ejection fraction (HFpEF) --Biventricular HF   CKD (chronic kidney disease), stage IV (HCC)   HTN (hypertension)   Bradycardia   Fluid overload    Hypothyroidism   Anxiety   Stroke ([place])   Anemia      Past Medical History:  Diagnosis Date   Anxiety and depression    At risk for falls    Coronary artery disease    Diabetes mellitus ([place]) 11/08/2020   Diabetes mellitus without complication (HCC)    GERD (gastroesophageal reflux disease)    History of skin cancer    Right shin   HOH (hard of hearing)    Hyperlipidemia    Hypertension    Hypothyroidism    Stroke [facility])     Past Surgical History:  Procedure Laterality Date   CARDIAC CATHETERIZATION     [phone]   CARPAL TUNNEL RELEASE Left    CHOLECYSTECTOMY  1967   DIAGNOSTIC MAMMOGRAM     HERNIA REPAIR     HYSTERECTOMY ABDOMINAL WITH SALPINGO-OOPHORECTOMY  1960   MASTECTOMY Right 2012      HPI  from the history and physical done on the day of admission:    Chief Complaint: Difficulty breathing   HPI: Amy Kelly is a 87 y.o. female with medical history significant for DM, Stroke, HTN, hypothyroidism, coronary artery disease. Patient presented to the ED with complaints of difficulty breathing of 2 weeks duration.  Patient's son [name] is at bedside and helps with the history.  She reports breathing is worse with exertion.  Chronic stable orthopnea.  She has chronic bilateral lower extremity swelling that has been intermittent over the past year at least, she is unsure if it is worse. She reports chest tightness due to difficulty breathing without actual chest pain that started today.  Son at bedside reports patient has anxiety and panic attacks, and feels this is why she was having the chest tightness.  She does not know if she has gained weight, and is unsure of her baseline weight. At baseline patient is not very active.   ED Course: Temperature 97.6.  Bradycardic - heart rate 40s.  Respiratory rate 13-20.  Blood pressure systolic 0000000 to [id number].  O2 sats down to 88% on room air, currently on 3 L sats greater than 93%.  Chest x-ray showing findings consistent with  mild congestive heart failure and interstitial edema. Low hemoglobin 9.9.  Troponin 7.  BNP 299. IV Lasix 40 mg given.  Hospitalist to admit for acute respiratory failure.     Hospital Course:   Brief Summary:- 87 y.o. female with medical history significant for DM, Stroke, HTN, hypothyroidism, coronary artery disease admitted on 11/08/2020 with acute hypoxic respiratory failure secondary to presumed HFpEF   A/p  1)Acute Hypoxic Respiratory Failure secondary to HFpEF -Echo with EF of 55 to 60% with moderate pulmonary hypertension suspect chronic diastolic dysfunction --Patient qualifies for home O2 at 2 L/min via nasal cannula continuously -Creatinine and BUN trending up, change Lasix to 40 mg IV daily from twice daily -Monitor daily weights and fluid input and output monitoring -Chest x-ray and BNP on admission suggestive of CHF --Lower extremity edema noted-venous Dopplers pending -Urine output on in and out fluid documentation and does not appear to be accurate  2)HFpEF----patient with acute on chronic diastolic dysfunction CHF  ---improved with IV Lasix, okay to discharge on p.o. Lasix -Fluid balance is negative, weight is down 190 pounds from 207 pounds on 11/08/2020 intake/Output Summary (Last 24 hours) at 11/11/2020 0802  Intake/Output Summary (Last 24 hours) at 11/11/2020 1106 Last data filed at 11/11/2020 0804 Gross per 24 hour  Intake 680 ml  Output 3350 ml  Net -2670 ml   -Repeat BMP with PCP in about a week or so   3)Chronic Normocytic and normochromic Anemia--- no evidence of ongoing bleeding, hemoglobin currently greater than 9 -B12 and folate are not low -Ferritin and serum iron are not low -Suspect partly related to underlying CKD -Consider EPO/ESA agent per protocol -Discussed with GI service patient is practically 87 years old--no endoluminal evaluation planned unless H&H drops significantly or evidence of active bleeding -Hemoglobin currently stable above 10    4)HTN-- continue amlodipine 10 mg, restart losartan and Cardura , -Discontinue Metoprolol due to bradycardia which may be contributing to her dyspnea on exertion   5)DM2--- A1c 7.0 reflecting fair diabetic control,  Stop glimepiride -Continue Lantus 5 units every morning Use Novolog/Humalog Sliding scale insulin with Accu-Cheks/Fingersticks as ordered    6)H/o Prior CVA--okay to continue Plavix and crestor for secondary prevention   7)Depression/Anxiety----stable, continue Lexapro    8)Hypothyroidism--TSH is 4.5 , continue levothyroxine 25 mcg daily (will not increase at this time)   9)Generalized weakness and deconditioning/movement disorder --- continue Sinemet and gabapentin  -Physical therapist recommends home health PT  10)AKI----acute kidney injury on CKD stage - IV -Creatinine is remained stable around patient baseline which is usually between 1.6 and  1.7  renally adjust medications, avoid nephrotoxic agents / dehydration  / hypotension -Repeat BMP with PCP in about a week   Disposition--Home with home health and home O2   Disposition: The patient is from: Home --apartment complex              Anticipated d/c is to: Home with HH and home oxygen                  Code Status :  -  Code Status: DNR    Family Communication:    (patient is alert, awake and coherent)  Discussed with son [name]  Discharge Condition: Stable  Follow UP   Follow-up Information     [facility] Follow up.   Specialty: Home Health Services Why: [facility] Contact information: [place] [address] [phone]         [facility] Follow up.   Why: O2 Contact information: [place] [phone]                Diet and Activity recommendation:  As advised  Discharge Instructions    Discharge Instructions     Call MD for:  difficulty breathing, headache or visual disturbances   Complete  by: As directed    Call MD for:  persistant dizziness or light-headedness   Complete by: As directed    Call MD for:  temperature >100.4   Complete by: As directed    Diet - low sodium heart healthy   Complete by: As directed    Diet Carb Modified   Complete by: As directed    Discharge instructions   Complete by: As directed    1)Very low-salt diet advised- less than 2 g of sodium per day 2)Weigh yourself daily, call if you gain more than 3 pounds in 1 day or more than 5 pounds in 1 week as your diuretic medications may need to be adjusted 3)Limit your Fluid  intake to no more than 60 ounces (1.8 Liters) per day 4)Avoid ibuprofen/Advil/Aleve/Motrin/Goody Powders/Naproxen/BC powders/Meloxicam/Diclofenac/Indomethacin and other Nonsteroidal anti-inflammatory medications as these will make you more likely to bleed and can cause stomach ulcers, can also cause Kidney problems.  5)You need oxygen at home at 2 L via nasal cannula continuously while awake and while asleep--- smoking or having open fires around oxygen can cause fire, significant injury and death 6)follow up with Sherrilee Gilles, DO (PCP) in about a week for recheck of your BMP blood test 7)Please use TED compression stockings (Knee High)--placed them on in the morning and you can take them off at bedtime   Increase activity slowly   Complete by: As directed        Discharge Medications     Allergies as of 11/11/2020       Reactions   Penicillins Diarrhea, Nausea Only   Prednisone Diarrhea, Nausea Only   Sulfa Antibiotics Diarrhea, Nausea Only        Medication List     STOP taking these medications    glimepiride 2 MG tablet Commonly known as: AMARYL   metoprolol tartrate 25 MG tablet Commonly known as: LOPRESSOR       TAKE these medications    amLODipine 10 MG tablet Commonly known as: NORVASC Take 10 mg by mouth daily.   ascorbic acid 1000 MG tablet Commonly known as: VITAMIN C Take 1,000 mg by mouth  daily.   calcium-vitamin D 500-200  MG-UNIT tablet Commonly known as: OSCAL WITH D Take 2 tablets by mouth daily with breakfast.   Carbidopa-Levodopa ER 25-100 MG tablet controlled release Commonly known as: SINEMET CR Take 1 tablet by mouth daily.   clopidogrel 75 MG tablet Commonly known as: PLAVIX Take 75 mg by mouth daily.   [place] Plus Vitamin C 4200-20-3 MG-MG-UNIT Caps Generic drug: [place]-Vitamin C-Vitamin E Take 1 tablet by mouth in the morning and at bedtime.   doxazosin 2 MG tablet Commonly known as: CARDURA Take 2 mg by mouth at bedtime.   escitalopram 10 MG tablet Commonly known as: LEXAPRO Take 10 mg by mouth daily.   folic acid Q000111Q MCG tablet Commonly known as: FOLVITE Take 400 mcg by mouth daily.   furosemide 40 MG tablet Commonly known as: LASIX Take 1 tablet (40 mg total) by mouth every morning. For fluid   gabapentin 100 MG capsule Commonly known as: Neurontin Take 1 capsule (100 mg total) by mouth at bedtime.   insulin aspart 100 UNIT/ML FlexPen Commonly known as: NOVOLOG Inject 0-6 Units into the skin at bedtime. Dose per sliding scale   Lantus SoloStar 100 UNIT/ML Solostar Pen Generic drug: insulin glargine Inject 5 Units into the skin daily.   levothyroxine 25 MCG tablet Commonly known as: SYNTHROID Take 1 tablet (25 mcg total) by mouth daily before breakfast. What changed:  how much to take when to take this   losartan 100 MG tablet Commonly known as: COZAAR Take 100 mg by mouth daily.   MULTIVITAMIN ADULT PO Take 1 tablet by mouth daily.   omeprazole 40 MG capsule Commonly known as: PRILOSEC Take 40 mg by mouth daily with breakfast.   potassium chloride 10 MEQ tablet Commonly known as: KLOR-CON Take 1 tablet (10 mEq total) by mouth daily. Take While taking Lasix/furosemide   rosuvastatin 40 MG tablet Commonly known as: CRESTOR Take 40 mg by mouth at bedtime.   vitamin B-12 500 MCG tablet Commonly known as:  CYANOCOBALAMIN Take 500 mcg by mouth daily.   Vitamin D-3 125 MCG (5000 UT) Tabs Take 5,000 Units by mouth daily.               Durable Medical Equipment  (From admission, onward)           Start     Ordered   11/10/20 1703  For home use only DME oxygen  Once       Comments: SATURATION QUALIFICATIONS: (This note is used to comply with regulatory documentation for home oxygen)   Patient Saturations on Room Air at Rest = 92 %   Patient Saturations on Room Air while Ambulating = 87 %   Patient Saturations on 2 Liters of oxygen while Ambulating = 91 %      Patient needs continuous O2 at 2 L/min continuously via nasal cannula with humidifier, with gaseous portability and conserving device   -- Congestive heart failure  Question Answer Comment  Length of Need Lifetime   Mode or (Route) Nasal cannula   Liters per Minute 2   Frequency Continuous (stationary and portable oxygen unit needed)   Oxygen conserving device Yes   Oxygen delivery system Gas      11/10/20 1703            Major procedures and Radiology Reports - PLEASE review detailed and final reports for all details, in brief -    US Venous Img Lower Bilateral (DVT)  Result Date: 11/11/2020 CLINICAL DATA:  Bilateral calf edema  and pain EXAM: BILATERAL LOWER EXTREMITY VENOUS DOPPLER ULTRASOUND TECHNIQUE: Gray-scale sonography with graded compression, as well as color Doppler and duplex ultrasound were performed to evaluate the lower extremity deep venous systems from the level of the common femoral vein and including the common femoral, femoral, profunda femoral, popliteal and calf veins including the posterior tibial, peroneal and gastrocnemius veins when visible. The superficial great saphenous vein was also interrogated. Spectral Doppler was utilized to evaluate flow at rest and with distal augmentation maneuvers in the common femoral, femoral and popliteal veins. COMPARISON:  None. FINDINGS: RIGHT LOWER  EXTREMITY Common Femoral Vein: No evidence of thrombus. Normal compressibility, respiratory phasicity and response to augmentation. Saphenofemoral Junction: No evidence of thrombus. Normal compressibility and flow on color Doppler imaging. Profunda Femoral Vein: No evidence of thrombus. Normal compressibility and flow on color Doppler imaging. Femoral Vein: No evidence of thrombus. Normal compressibility, respiratory phasicity and response to augmentation. Popliteal Vein: No evidence of thrombus. Normal compressibility, respiratory phasicity and response to augmentation. Calf Veins: Limited visualization of the calf veins because of body habitus and peripheral edema. No gross thrombus appreciated. Superficial Great Saphenous Vein: No evidence of thrombus. Normal compressibility. LEFT LOWER EXTREMITY Common Femoral Vein: No evidence of thrombus. Normal compressibility, respiratory phasicity and response to augmentation. Saphenofemoral Junction: No evidence of thrombus. Normal compressibility and flow on color Doppler imaging. Profunda Femoral Vein: No evidence of thrombus. Normal compressibility and flow on color Doppler imaging. Femoral Vein: No evidence of thrombus. Normal compressibility, respiratory phasicity and response to augmentation. Popliteal Vein: No evidence of thrombus. Normal compressibility, respiratory phasicity and response to augmentation. Calf Veins: Limited visualization of the calf veins because of body habitus and edema. No gross thrombus appreciated. Superficial Great Saphenous Vein: No evidence of thrombus. Normal compressibility. Other Findings: Complex septated left popliteal fossa Baker's cyst measures 3.6 x 2.4 x 3.5 cm IMPRESSION: Limited exam because of body habitus and peripheral edema. No significant acute occlusive DVT in either extremity. 3.6 cm left popliteal fossa complex Baker's cyst. Electronically Signed   By: Jerilynn Mages.  Shick M.D.   On: 11/11/2020 07:22   DG Chest Port 1  View  Result Date: 11/08/2020 CLINICAL DATA:  Short of breath for 2 weeks, pain with inspiration EXAM: PORTABLE CHEST 1 VIEW COMPARISON:  None. FINDINGS: Single frontal view of the chest demonstrates an enlarged cardiac silhouette. There is central vascular congestion with basilar predominant interstitial opacities and trace bilateral pleural effusions, most consistent with mild interstitial edema and volume overload. No pneumothorax. IMPRESSION: 1. Findings consistent with mild congestive heart failure and interstitial edema. Electronically Signed   By: Randa Ngo M.D.   On: 11/08/2020 15:56   ECHOCARDIOGRAM COMPLETE  Result Date: 11/09/2020    ECHOCARDIOGRAM REPORT   Patient Name:   Pete Rising Date of Exam: 11/09/2020 Medical Rec #:  [id number]   Height:       63.0 in Accession #:    [id number]  Weight:       211.9 lb Date of Birth:  1928-06-24   BSA:          1.982 m Patient Age:    54 years    BP:           140/63 mmHg Patient Gender: F           HR:           56 bpm. Exam Location:  [facility] Procedure: 2D Echo, Cardiac Doppler and Color Doppler Indications:    [place]  respiratory distress R06.03  History:        Patient has no prior history of Echocardiogram examinations.                 CAD, Stroke; Risk Factors:Hypertension and Dyslipidemia. History                 of skin cancer (From Hx).  Sonographer:    Alvino Chapel RCS Referring Phys: [phone] [place]  1. Left ventricular ejection fraction, by estimation, is 55 to 60%. The left ventricle has normal function. The left ventricle has no regional wall motion abnormalities. Left ventricular diastolic parameters are indeterminate.  2. Right ventricular systolic function is normal. The right ventricular size is normal. There is moderately elevated pulmonary artery systolic pressure.  3. Left atrial size was moderately dilated.  4. Right atrial size was mildly dilated.  5. The mitral valve is normal in structure. Trivial mitral  valve regurgitation. No evidence of mitral stenosis.  6. The tricuspid valve is abnormal.  7. The aortic valve was not well visualized. There is moderate calcification of the aortic valve. There is moderate thickening of the aortic valve. Aortic valve regurgitation is not visualized. No aortic stenosis is present.  8. The inferior vena cava is dilated in size with >50% respiratory variability, suggesting right atrial pressure of 8 mmHg. FINDINGS  Left Ventricle: Left ventricular ejection fraction, by estimation, is 55 to 60%. The left ventricle has normal function. The left ventricle has no regional wall motion abnormalities. The left ventricular internal cavity size was normal in size. There is  no left ventricular hypertrophy. Left ventricular diastolic parameters are indeterminate. Right Ventricle: The right ventricular size is normal. No increase in right ventricular wall thickness. Right ventricular systolic function is normal. There is moderately elevated pulmonary artery systolic pressure. The tricuspid regurgitant velocity is 3.26 m/s, and with an assumed right atrial pressure of 8 mmHg, the estimated right ventricular systolic pressure is 123XX123 mmHg. Left Atrium: Left atrial size was moderately dilated. Right Atrium: Right atrial size was mildly dilated. Pericardium: There is no evidence of pericardial effusion. Mitral Valve: The mitral valve is normal in structure. There is mild thickening of the mitral valve leaflet(s). There is mild calcification of the mitral valve leaflet(s). Mild mitral annular calcification. Trivial mitral valve regurgitation. No evidence  of mitral valve stenosis. Tricuspid Valve: The tricuspid valve is abnormal. Tricuspid valve regurgitation is mild . No evidence of tricuspid stenosis. Aortic Valve: The aortic valve was not well visualized. There is moderate calcification of the aortic valve. There is moderate thickening of the aortic valve. There is moderate aortic valve annular  calcification. Aortic valve regurgitation is not visualized. No aortic stenosis is present. Aortic valve mean gradient measures 7.8 mmHg. Aortic valve peak gradient measures 15.6 mmHg. Aortic valve area, by VTI measures 1.71 cm. Pulmonic Valve: The pulmonic valve was not well visualized. Pulmonic valve regurgitation is not visualized. No evidence of pulmonic stenosis. Aorta: The aortic root is normal in size and structure. Venous: The inferior vena cava is dilated in size with greater than 50% respiratory variability, suggesting right atrial pressure of 8 mmHg. IAS/Shunts: No atrial level shunt detected by color flow Doppler.  LEFT VENTRICLE PLAX 2D LVIDd:         5.90 cm  Diastology LVIDs:         4.20 cm  LV e' medial:    5.44 cm/s LV PW:  1.00 cm  LV E/e' medial:  28.7 LV IVS:        0.90 cm  LV e' lateral:   8.70 cm/s LVOT diam:     1.70 cm  LV E/e' lateral: 17.9 LV SV:         84 LV SV Index:   42 LVOT Area:     2.27 cm  RIGHT VENTRICLE RV S prime:     10.30 cm/s TAPSE (M-mode): 2.1 cm LEFT ATRIUM             Index       RIGHT ATRIUM           Index LA diam:        4.30 cm 2.17 cm/m  RA Area:     21.20 cm LA Vol (A2C):   83.3 ml 42.04 ml/m RA Volume:   66.90 ml  33.76 ml/m LA Vol (A4C):   85.3 ml 43.05 ml/m LA Biplane Vol: 87.4 ml 44.11 ml/m  AORTIC VALVE AV Area (Vmax):    1.63 cm AV Area (Vmean):   1.54 cm AV Area (VTI):     1.71 cm AV Vmax:           197.50 cm/s AV Vmean:          129.765 cm/s AV VTI:            0.489 m AV Peak Grad:      15.6 mmHg AV Mean Grad:      7.8 mmHg LVOT Vmax:         142.00 cm/s LVOT Vmean:        88.300 cm/s LVOT VTI:          0.369 m LVOT/AV VTI ratio: 0.75  AORTA Ao Root diam: 3.20 cm MITRAL VALVE                TRICUSPID VALVE MV Area (PHT): 5.38 cm     TR Peak grad:   42.5 mmHg MV Decel Time: 141 msec     TR Vmax:        326.00 cm/s MV E velocity: 156.00 cm/s MV A velocity: 144.00 cm/s  SHUNTS MV E/A ratio:  1.08         Systemic VTI:  0.37 m                              Systemic Diam: 1.70 cm Carlyle Dolly MD Electronically signed by Carlyle Dolly MD Signature Date/Time: 11/09/2020/4:42:57 PM    Final     Micro Results    Recent Results (from the past 240 hour(s))  Resp Panel by RT-PCR (Flu A&B, Covid) Nasopharyngeal Swab     Status: None   Collection Time: 11/08/20  3:24 PM   Specimen: Nasopharyngeal Swab; Nasopharyngeal(NP) swabs in vial transport medium  Result Value Ref Range Status   SARS Coronavirus 2 by RT PCR NEGATIVE NEGATIVE Final    Comment: (NOTE) SARS-CoV-2 target nucleic acids are NOT DETECTED.  The SARS-CoV-2 RNA is generally detectable in upper respiratory specimens during the acute phase of infection. The lowest concentration of SARS-CoV-2 viral copies this assay can detect is 138 copies/mL. A negative result does not preclude SARS-Cov-2 infection and should not be used as the sole basis for treatment or other patient management decisions. A negative result may occur with  improper specimen collection/handling, submission of specimen other than nasopharyngeal swab, presence of viral mutation(s) within the  areas targeted by this assay, and inadequate number of viral copies(<138 copies/mL). A negative result must be combined with clinical observations, patient history, and epidemiological information. The expected result is Negative.  Fact Sheet for Patients:  EntrepreneurPulse.com.au  Fact Sheet for Healthcare Providers:  IncredibleEmployment.be  This test is no t yet approved or cleared by the [place] FDA and  has been authorized for detection and/or diagnosis of SARS-CoV-2 by FDA under an Emergency Use Authorization (EUA). This EUA will remain  in effect (meaning this test can be used) for the duration of the COVID-19 declaration under Section 564(b)(1) of the Act, 21 U.S.C.section 360bbb-3(b)(1), unless the authorization is terminated  or revoked sooner.        Influenza A by PCR NEGATIVE NEGATIVE Final   Influenza B by PCR NEGATIVE NEGATIVE Final    Comment: (NOTE) The Xpert Xpress SARS-CoV-2/FLU/RSV plus assay is intended as an aid in the diagnosis of influenza from Nasopharyngeal swab specimens and should not be used as a sole basis for treatment. Nasal washings and aspirates are unacceptable for Xpert Xpress SARS-CoV-2/FLU/RSV testing.  Fact Sheet for Patients: EntrepreneurPulse.com.au  Fact Sheet for Healthcare Providers: IncredibleEmployment.be  This test is not yet approved or cleared by the [place] FDA and has been authorized for detection and/or diagnosis of SARS-CoV-2 by FDA under an Emergency Use Authorization (EUA). This EUA will remain in effect (meaning this test can be used) for the duration of the COVID-19 declaration under Section 564(b)(1) of the Act, 21 U.S.C. section 360bbb-3(b)(1), unless the authorization is terminated or revoked.  Performed at [facility], [address], [place], [place] [address]   Culture, blood (routine x 2)     Status: None (Preliminary result)   Collection Time: 11/08/20  4:15 PM   Specimen: BLOOD RIGHT HAND  Result Value Ref Range Status   Specimen Description BLOOD RIGHT HAND  Final   Special Requests   Final    BOTTLES DRAWN AEROBIC AND ANAEROBIC Blood Culture results may not be optimal due to an excessive volume of blood received in culture bottles   Culture   Final    NO GROWTH 3 DAYS Performed at [facility], [address], [place], [place] [address]    Report Status PENDING  Incomplete  Culture, blood (routine x 2)     Status: None (Preliminary result)   Collection Time: 11/08/20  4:36 PM   Specimen: BLOOD RIGHT ARM  Result Value Ref Range Status   Specimen Description BLOOD RIGHT ARM  Final   Special Requests   Final    BOTTLES DRAWN AEROBIC AND ANAEROBIC Blood Culture adequate volume   Culture   Final    NO GROWTH 3 DAYS Performed  at [facility], [address], [place], [place] [address]    Report Status PENDING  Incomplete       Today   Subjective    Marceil Lintz today has no new complaints No fever  Or chills  -No dyspnea at rest, dyspnea on exertion improving, voiding well, fluid balance negative          Patient has been seen and examined prior to discharge   Objective   Blood pressure (!) 162/55, pulse 94, temperature 98.6 F (37 C), resp. rate 20, height '5\' 3"'$  (1.6 m), weight 86.6 kg, SpO2 91 %.   Intake/Output Summary (Last 24 hours) at 11/11/2020 1106 Last data filed at 11/11/2020 0804 Gross per 24 hour  Intake 680 ml  Output 3350 ml  Net -2670 ml   Exam Gen:- Awake Alert, in no acute distress HEENT:- [place].AT, No sclera icterus Nose- [place] 2 L/min Neck-Supple Neck,No JVD,.  Lungs-improved air movement, no wheeze occasional rhonchi CV- S1, S2 normal, regular (resolved bradycardia) Abd-  +ve B.Sounds, Abd Soft, No tenderness,    Extremity/Skin:-Improving lower extremity pitting edema, pedal pulses present  Psych-affect is appropriate, oriented x3 Neuro-generalized weakness, no new focal deficits, no tremors   Data Review   CBC w Diff:  Lab Results  Component Value Date   WBC 8.5 11/11/2020   HGB 10.2 (L) 11/11/2020   HCT 32.3 (L) 11/11/2020   PLT 182 11/11/2020   LYMPHOPCT 13 11/08/2020   MONOPCT 10 11/08/2020   EOSPCT 3 11/08/2020   BASOPCT 1 11/08/2020    CMP:  Lab Results  Component Value Date   NA 141 11/11/2020   K 4.2 11/11/2020   CL 100 11/11/2020   CO2 31 11/11/2020   BUN 40 (H) 11/11/2020   CREATININE 1.66 (H) 11/11/2020   ALBUMIN 3.0 (L) 11/11/2020  . Total Discharge time is about 33 minutes  Roxan Hockey M.D on 11/11/2020 at 11:06 AM  Go to [email or web address] -  for contact info  [facility] - Office  [phone]

## 2020-11-11 NOTE — TOC Transition Note (Signed)
Transition of Care [facility]) - CM/SW Discharge Note   Patient Details  Name: Amy Kelly MRN: [id number] Date of Birth: [birth year]  [facility] CM/SW Contact:  Natasha Bence, LCSW Phone Number: 11/11/2020, 10:29 AM   Clinical Narrative:    CSW notified of patient's readiness for discharge. CSW referred patient to Georgina Snell with [facility] for [facility]. Georgina Snell agreeable to take patient. CSW referred patient to [employer] for oxygen. Caryl Pina with agreeable to take patient. TOC signing off.   Final next level of care: [place] Barriers to Discharge: Barriers Resolved   Patient Goals and CMS Choice Patient states their goals for this hospitalization and ongoing recovery are:: Return home with [facility] CMS Medicare.gov Compare Post Acute Care list provided to:: Patient    Discharge Placement                    Patient and family notified of of transfer: 11/11/20  Discharge Plan and Services                DME Arranged: Oxygen DME Agency: [employer] Date DME Agency Contacted: 11/11/20 Time DME Agency Contacted: [phone] Representative spoke with at DME Agency: Caryl Pina HH Arranged: PT, RN [facility] Agency: [place] Date [place]: 11/11/20 Time [place]: 1028 Representative spoke with at [place]: [place] Interventions     Readmission Risk Interventions No flowsheet data found.

## 2020-11-11 NOTE — Discharge Instructions (Signed)
1)Very low-salt diet advised- less than 2 g of sodium per day 2)Weigh yourself daily, call if you gain more than 3 pounds in 1 day or more than 5 pounds in 1 week as your diuretic medications may need to be adjusted 3)Limit your Fluid  intake to no more than 60 ounces (1.8 Liters) per day 4)Avoid ibuprofen/Advil/Aleve/Motrin/Goody Powders/Naproxen/BC powders/Meloxicam/Diclofenac/Indomethacin and other Nonsteroidal anti-inflammatory medications as these will make you more likely to bleed and can cause stomach ulcers, can also cause Kidney problems.  5)You need oxygen at home at 2 L via nasal cannula continuously while awake and while asleep--- smoking or having open fires around oxygen can cause fire, significant injury and death 6)follow up with Sherrilee Gilles, DO (PCP) in about a week for recheck of your BMP blood test 7)Please use TED compression stockings (Knee High)--placed them on in the morning and you can take them off at bedtime

## 2020-11-13 LAB — CULTURE, BLOOD (ROUTINE X 2)
Culture: NO GROWTH
Culture: NO GROWTH
Special Requests: ADEQUATE

## 2020-11-13 LAB — GLUCOSE, CAPILLARY: Glucose-Capillary: 155 mg/dL — ABNORMAL HIGH (ref 70–99)

## 2020-12-05 ENCOUNTER — Other Ambulatory Visit (HOSPITAL_COMMUNITY): Payer: Self-pay | Admitting: Nephrology

## 2020-12-05 ENCOUNTER — Other Ambulatory Visit: Payer: Self-pay | Admitting: Nephrology

## 2020-12-05 DIAGNOSIS — R809 Proteinuria, unspecified: Secondary | ICD-10-CM

## 2020-12-05 DIAGNOSIS — E1122 Type 2 diabetes mellitus with diabetic chronic kidney disease: Secondary | ICD-10-CM

## 2020-12-05 DIAGNOSIS — E1129 Type 2 diabetes mellitus with other diabetic kidney complication: Secondary | ICD-10-CM

## 2020-12-13 ENCOUNTER — Ambulatory Visit (HOSPITAL_COMMUNITY)
Admission: RE | Admit: 2020-12-13 | Discharge: 2020-12-13 | Disposition: A | Discharge status: Home / Self Care | Payer: Medicare HMO | Source: Ambulatory Visit | Attending: Nephrology | Admitting: Nephrology

## 2020-12-13 ENCOUNTER — Other Ambulatory Visit: Payer: Self-pay

## 2020-12-13 DIAGNOSIS — E1129 Type 2 diabetes mellitus with other diabetic kidney complication: Secondary | ICD-10-CM

## 2020-12-13 DIAGNOSIS — R809 Proteinuria, unspecified: Secondary | ICD-10-CM | POA: Diagnosis present

## 2020-12-13 DIAGNOSIS — E1122 Type 2 diabetes mellitus with diabetic chronic kidney disease: Secondary | ICD-10-CM

## 2021-03-26 NOTE — Progress Notes (Signed)
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  For thalamic pain, increase gabapentin to 100mg  twice daily.  If not adequately controlled in a month, will increase to three times daily 3.  Follow up 6 months.     Subjective:  Amy Kelly is an 88 year old right-handed female with HTN, DM II, HLD, CAD, CKD and RLS who follows up for right thalamic infarct.  Accompanied by her son.   UPDATE: Current medications:  Plavix 75mg , rosuvastatin 40mg , amlodipine, losartan glimepiride, gabapentin 100mg  at bedtime  Started gabapentin last time for thalamic pain.  It has helped at night but still notes pain and numbness during the day.     HISTORY: Patient had a stroke in November 2021, presenting with left sided weakness and numbness and blurred vision.  She presented to the hospital in [place], [place].  CT head showed no acute findings but MRI of brain showed acute/subacute lacunar infarct within the right thalamus.  She was outside the window for tPA.  Carotid ultrasound demonstrated bilateral calcified plaque without hemodynamically significant stenosis.She was on ASA prior to stroke and discharged on DAPT.  Currently still taking ASA with Plavix.  She reports ongoing numbness and discomfort involving the left side of her head, face, arm, hand and leg.  She already has underlying polyneuropathy, gout and arthritis that impedes her balance and is now less steady.  Uses a rolling walker.    PAST MEDICAL HISTORY: Past Medical History:  Diagnosis Date   Anxiety and depression    At risk for falls    Coronary artery disease     Diabetes mellitus ([place]) 11/08/2020   Diabetes mellitus without complication (HCC)    GERD (gastroesophageal reflux disease)    History of skin cancer    Right shin   HOH (hard of hearing)    Hyperlipidemia    Hypertension    Hypothyroidism    Stroke [facility])     MEDICATIONS: Current Outpatient Medications on File Prior to Visit  Medication Sig Dispense Refill   amLODipine (NORVASC) 10 MG tablet Take 10 mg by mouth daily.     ascorbic acid (VITAMIN C) 1000 MG tablet Take 1,000 mg by mouth daily.     calcium-vitamin D (OSCAL WITH D) 500-200 MG-UNIT tablet Take 2 tablets by mouth daily with breakfast.     Carbidopa-Levodopa ER (SINEMET CR) 25-100 MG tablet controlled release Take 1 tablet by mouth daily.     Cholecalciferol (VITAMIN D-3) 125 MCG (5000 UT) TABS Take 5,000 Units by mouth daily.     clopidogrel (PLAVIX) 75 MG tablet Take 75 mg by mouth daily.     Cranberry-Vitamin C-Vitamin E (CRANBERRY PLUS VITAMIN C) 4200-20-3 MG-MG-UNIT CAPS Take 1 tablet by mouth in the morning and at bedtime.     doxazosin (CARDURA) 2 MG tablet Take 2 mg by mouth at bedtime.     escitalopram (LEXAPRO) 10 MG tablet Take 10 mg by mouth daily.     folic acid (FOLVITE) 053 MCG tablet Take 400 mcg by mouth daily.     furosemide (LASIX) 40 MG tablet Take 1 tablet (40 mg total)  by mouth every morning. For fluid 30 tablet 5   gabapentin (NEURONTIN) 100 MG capsule Take 1 capsule (100 mg total) by mouth at bedtime. 30 capsule 5   insulin aspart (NOVOLOG) 100 UNIT/ML FlexPen Inject 0-6 Units into the skin at bedtime. Dose per sliding scale     insulin glargine (LANTUS SOLOSTAR) 100 UNIT/ML Solostar Pen Inject 5 Units into the skin daily.     levothyroxine (SYNTHROID) 25 MCG tablet Take 1 tablet (25 mcg total) by mouth daily before breakfast. 30 tablet 5   losartan (COZAAR) 100 MG tablet Take 100 mg by mouth daily.     Multiple Vitamin (MULTIVITAMIN ADULT PO) Take 1 tablet by mouth daily.     omeprazole (PRILOSEC)  40 MG capsule Take 40 mg by mouth daily with breakfast.     potassium chloride (KLOR-CON) 10 MEQ tablet Take 1 tablet (10 mEq total) by mouth daily. Take While taking Lasix/furosemide 30 tablet 2   rosuvastatin (CRESTOR) 40 MG tablet Take 40 mg by mouth at bedtime.     vitamin B-12 (CYANOCOBALAMIN) 500 MCG tablet Take 500 mcg by mouth daily.     No current facility-administered medications on file prior to visit.    ALLERGIES: Allergies  Allergen Reactions   Penicillins Diarrhea and Nausea Only   Prednisone Diarrhea and Nausea Only   Sulfa Antibiotics Diarrhea and Nausea Only    FAMILY HISTORY: No family history on file.    Objective:  Blood pressure 130/66, pulse 67, height 5\' 3"  (1.6 m), weight 190 lb (86.2 kg), SpO2 98 %. General: No acute distress.  Patient appears well-groomed.      Metta Clines, DO  CC: Sherrilee Gilles, DO

## 2021-03-27 ENCOUNTER — Ambulatory Visit (INDEPENDENT_AMBULATORY_CARE_PROVIDER_SITE_OTHER): Payer: Medicare HMO | Admitting: Neurology

## 2021-03-27 ENCOUNTER — Encounter: Payer: Self-pay | Admitting: Neurology

## 2021-03-27 ENCOUNTER — Other Ambulatory Visit: Payer: Self-pay

## 2021-03-27 VITALS — BP 130/66 | HR 67 | Ht 63.0 in | Wt 190.0 lb

## 2021-03-27 DIAGNOSIS — I10 Essential (primary) hypertension: Secondary | ICD-10-CM | POA: Diagnosis not present

## 2021-03-27 DIAGNOSIS — E1142 Type 2 diabetes mellitus with diabetic polyneuropathy: Secondary | ICD-10-CM

## 2021-03-27 DIAGNOSIS — G890 Central pain syndrome: Secondary | ICD-10-CM | POA: Diagnosis not present

## 2021-03-27 DIAGNOSIS — I6381 Other cerebral infarction due to occlusion or stenosis of small artery: Secondary | ICD-10-CM | POA: Diagnosis not present

## 2021-03-27 DIAGNOSIS — E785 Hyperlipidemia, unspecified: Secondary | ICD-10-CM

## 2021-03-27 MED ORDER — GABAPENTIN 100 MG PO CAPS: 100.0000 mg | ORAL_CAPSULE | Freq: Two times a day (BID) | ORAL | 0 refills | Status: DC

## 2021-03-27 NOTE — Patient Instructions (Signed)
Increase gabapentin 100mg  to twice daily.  If no improvement in 4 weeks, contact me and we can increase dose.

## 2021-06-26 ENCOUNTER — Other Ambulatory Visit (HOSPITAL_COMMUNITY): Payer: Self-pay | Admitting: Nephrology

## 2021-06-26 ENCOUNTER — Other Ambulatory Visit: Payer: Self-pay | Admitting: Nephrology

## 2021-06-26 DIAGNOSIS — E211 Secondary hyperparathyroidism, not elsewhere classified: Secondary | ICD-10-CM

## 2021-06-26 DIAGNOSIS — N170 Acute kidney failure with tubular necrosis: Secondary | ICD-10-CM

## 2021-06-26 DIAGNOSIS — E1122 Type 2 diabetes mellitus with diabetic chronic kidney disease: Secondary | ICD-10-CM

## 2021-06-29 ENCOUNTER — Other Ambulatory Visit (HOSPITAL_COMMUNITY)
Admission: RE | Admit: 2021-06-29 | Discharge: 2021-06-29 | Disposition: A | Discharge status: Home / Self Care | Payer: Medicare HMO | Source: Ambulatory Visit | Attending: Nephrology | Admitting: Nephrology

## 2021-06-29 DIAGNOSIS — E1129 Type 2 diabetes mellitus with other diabetic kidney complication: Secondary | ICD-10-CM | POA: Diagnosis present

## 2021-06-29 DIAGNOSIS — R809 Proteinuria, unspecified: Secondary | ICD-10-CM | POA: Insufficient documentation

## 2021-06-29 LAB — URINALYSIS, COMPLETE (UACMP) WITH MICROSCOPIC
Bilirubin Urine: NEGATIVE
Glucose, UA: 50 mg/dL — AB
Hgb urine dipstick: NEGATIVE
Ketones, ur: NEGATIVE mg/dL
Nitrite: NEGATIVE
Protein, ur: 30 mg/dL — AB
Specific Gravity, Urine: 1.011 (ref 1.005–1.030)
WBC, UA: >50 WBC/hpf — ABNORMAL HIGH (ref 0–5)
pH: 5 (ref 5.0–8.0)

## 2021-07-02 ENCOUNTER — Ambulatory Visit (HOSPITAL_COMMUNITY)
Admission: RE | Admit: 2021-07-02 | Discharge: 2021-07-02 | Disposition: A | Discharge status: Home / Self Care | Payer: Medicare HMO | Source: Ambulatory Visit | Attending: Nephrology | Admitting: Nephrology

## 2021-07-02 DIAGNOSIS — E1122 Type 2 diabetes mellitus with diabetic chronic kidney disease: Secondary | ICD-10-CM | POA: Insufficient documentation

## 2021-07-02 DIAGNOSIS — E211 Secondary hyperparathyroidism, not elsewhere classified: Secondary | ICD-10-CM | POA: Diagnosis present

## 2021-07-02 DIAGNOSIS — N170 Acute kidney failure with tubular necrosis: Secondary | ICD-10-CM

## 2021-07-02 LAB — URINE CULTURE: Culture: >=100000 — AB

## 2021-07-17 ENCOUNTER — Other Ambulatory Visit (HOSPITAL_COMMUNITY): Payer: Self-pay | Admitting: Nephrology

## 2021-07-17 ENCOUNTER — Other Ambulatory Visit: Payer: Self-pay | Admitting: Nephrology

## 2021-07-17 DIAGNOSIS — E1122 Type 2 diabetes mellitus with diabetic chronic kidney disease: Secondary | ICD-10-CM

## 2021-07-17 DIAGNOSIS — N170 Acute kidney failure with tubular necrosis: Secondary | ICD-10-CM

## 2021-07-17 DIAGNOSIS — I129 Hypertensive chronic kidney disease with stage 1 through stage 4 chronic kidney disease, or unspecified chronic kidney disease: Secondary | ICD-10-CM

## 2021-07-24 ENCOUNTER — Ambulatory Visit (HOSPITAL_COMMUNITY)
Admission: RE | Admit: 2021-07-24 | Discharge: 2021-07-24 | Disposition: A | Discharge status: Home / Self Care | Payer: Medicare HMO | Source: Ambulatory Visit | Attending: Nephrology | Admitting: Nephrology

## 2021-07-24 DIAGNOSIS — E1122 Type 2 diabetes mellitus with diabetic chronic kidney disease: Secondary | ICD-10-CM | POA: Diagnosis present

## 2021-07-24 DIAGNOSIS — N170 Acute kidney failure with tubular necrosis: Secondary | ICD-10-CM | POA: Insufficient documentation

## 2021-07-24 DIAGNOSIS — I129 Hypertensive chronic kidney disease with stage 1 through stage 4 chronic kidney disease, or unspecified chronic kidney disease: Secondary | ICD-10-CM | POA: Diagnosis present

## 2021-09-10 ENCOUNTER — Ambulatory Visit: Payer: Medicare HMO | Admitting: Urology

## 2021-09-10 ENCOUNTER — Encounter: Payer: Self-pay | Admitting: Urology

## 2021-09-10 VITALS — BP 135/75 | HR 77 | Ht 63.0 in | Wt 190.0 lb

## 2021-09-10 DIAGNOSIS — Z87440 Personal history of urinary (tract) infections: Secondary | ICD-10-CM | POA: Diagnosis not present

## 2021-09-10 DIAGNOSIS — R351 Nocturia: Secondary | ICD-10-CM | POA: Diagnosis not present

## 2021-09-10 DIAGNOSIS — R350 Frequency of micturition: Secondary | ICD-10-CM | POA: Diagnosis not present

## 2021-09-10 DIAGNOSIS — N3941 Urge incontinence: Secondary | ICD-10-CM | POA: Diagnosis not present

## 2021-09-10 LAB — BLADDER SCAN AMB NON-IMAGING: Scan Result: 0

## 2021-09-10 MED ORDER — GEMTESA 75 MG PO TABS: 75.0000 mg | ORAL_TABLET | Freq: Every day | ORAL | 0 refills | Status: DC

## 2021-09-10 NOTE — Progress Notes (Signed)
post void residual =0mL 

## 2021-10-01 NOTE — Progress Notes (Unsigned)
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  Check kidney function to help determine how much we can increase dose if needed. 2.  Secondary stroke prevention as managed by PCP: - Plavix 75mg  daily - Statin therapy:  LDL goal less than 70 - Glycemic control:  Hgb A1c goal less than 7 - Normotensive blood pressure 2.  Follow up 6 months.     Subjective:  Amy Kelly is an 88 year old right-handed female with HTN, DM II, HLD, CAD, CKD and RLS who follows up for right thalamic infarct.  Accompanied by her son.   UPDATE: Current medications:  Plavix 75mg , rosuvastatin 40mg , amlodipine, losartan glimepiride, gabapentin 100mg  BID   Last visit, increased gabapentin 100mg  to twice daily.  No improvement in pain.    HISTORY: Patient had a stroke in November 2021, presenting with left sided weakness and numbness and blurred vision.  She presented to the hospital in [place], [place].  CT head showed no acute findings but MRI of brain showed acute/subacute lacunar infarct within the right thalamus.  She was outside the window for tPA.  Carotid ultrasound demonstrated bilateral calcified plaque without hemodynamically significant stenosis.She was on ASA prior to stroke and discharged on DAPT.  Currently still taking ASA with Plavix.  She reports ongoing numbness and discomfort involving the left side of her head, face, arm, hand and leg.  She already has underlying polyneuropathy, gout and arthritis that impedes her balance and is now less steady.  Uses a rolling walker.      PAST MEDICAL HISTORY: Past Medical History:  Diagnosis Date   Anxiety and depression    At risk for falls    Coronary artery disease    Diabetes mellitus ([place]) 11/08/2020    Diabetes mellitus without complication (HCC)    GERD (gastroesophageal reflux disease)    History of skin cancer    Right shin   HOH (hard of hearing)    Hyperlipidemia    Hypertension    Hypothyroidism    Stroke [facility])     MEDICATIONS: Current Outpatient Medications on File Prior to Visit  Medication Sig Dispense Refill   amLODipine (NORVASC) 10 MG tablet Take 10 mg by mouth daily.     ascorbic acid (VITAMIN C) 1000 MG tablet Take 1,000 mg by mouth daily.     calcium-vitamin D (OSCAL WITH D) 500-200 MG-UNIT tablet Take 2 tablets by mouth daily with breakfast.     Carbidopa-Levodopa ER (SINEMET CR) 25-100 MG tablet controlled release Take 1 tablet by mouth daily.     Cholecalciferol (VITAMIN D-3) 125 MCG (5000 UT) TABS Take 5,000 Units by mouth daily.     clopidogrel (PLAVIX) 75 MG tablet Take 75 mg by mouth daily.     Cranberry-Vitamin C-Vitamin E (CRANBERRY PLUS VITAMIN C) 4200-20-3 MG-MG-UNIT CAPS Take 1 tablet by mouth in the morning and at bedtime.     doxazosin (CARDURA) 2 MG tablet Take by mouth.     escitalopram (LEXAPRO) 10 MG tablet Take 10 mg by mouth daily.     folic acid (FOLVITE) 010 MCG tablet Take 400 mcg by mouth daily.     furosemide (LASIX) 40 MG tablet Take 1 tablet (40 mg total) by mouth every morning. For fluid 30 tablet 5  gabapentin (NEURONTIN) 100 MG capsule Take 1 capsule (100 mg total) by mouth 2 (two) times daily. 60 capsule 0   insulin aspart (NOVOLOG) 100 UNIT/ML FlexPen Inject 0-6 Units into the skin at bedtime. Dose per sliding scale     insulin glargine (LANTUS SOLOSTAR) 100 UNIT/ML Solostar Pen Inject 5 Units into the skin daily.     levothyroxine (SYNTHROID) 50 MCG tablet Take by mouth.     losartan (COZAAR) 100 MG tablet Take by mouth.     Multiple Vitamin (MULTIVITAMIN ADULT PO) Take 1 tablet by mouth daily.     omeprazole (PRILOSEC) 40 MG capsule Take by mouth.     potassium chloride (KLOR-CON) 10 MEQ tablet Take 1 tablet (10 mEq total) by  mouth daily. Take While taking Lasix/furosemide 30 tablet 2   rosuvastatin (CRESTOR) 40 MG tablet Take 40 mg by mouth at bedtime.     Vibegron (GEMTESA) 75 MG TABS Take 75 mg by mouth daily. 28 tablet 0   vitamin B-12 (CYANOCOBALAMIN) 500 MCG tablet Take 500 mcg by mouth daily.     No current facility-administered medications on file prior to visit.    ALLERGIES: Allergies  Allergen Reactions   Penicillins Diarrhea and Nausea Only   Prednisone Diarrhea and Nausea Only   Sulfa Antibiotics Diarrhea and Nausea Only    FAMILY HISTORY: No family history on file.    Objective:  Blood pressure (!) 120/56, pulse 62, height 5\' 5"  (1.651 m), weight 190 lb (86.2 kg), SpO2 98 %. General: No acute distress.  Patient appears well-groomed.   Head:  Normocephalic/atraumatic Eyes:  Fundi examined but not visualized Neck: supple, no paraspinal tenderness, full range of motion Neurological Exam: alert and oriented to person, place, and time.  Speech fluent and not dysarthric, language intact.  Left V1-V3 sensory loss.  Bilateral hearing loss.  Otherwise, CN II-XII intact. Bulk and tone normal, muscle strength 5/5 throughout.  Sensation to light touch reduced in left upper extremity and bilateral feet.  Deep tendon reflexes absent throughout.  Finger to nose testing intact.  Cautious broad-based gait.  Uses walker.     Metta Clines, DO  CC: Sherrilee Gilles, DO

## 2021-10-02 ENCOUNTER — Other Ambulatory Visit (INDEPENDENT_AMBULATORY_CARE_PROVIDER_SITE_OTHER): Payer: Medicare HMO

## 2021-10-02 ENCOUNTER — Encounter: Payer: Self-pay | Admitting: Neurology

## 2021-10-02 ENCOUNTER — Ambulatory Visit: Payer: Medicare HMO | Admitting: Neurology

## 2021-10-02 VITALS — BP 120/56 | HR 62 | Ht 65.0 in | Wt 190.0 lb

## 2021-10-02 DIAGNOSIS — Z79899 Other long term (current) drug therapy: Secondary | ICD-10-CM

## 2021-10-02 DIAGNOSIS — G890 Central pain syndrome: Secondary | ICD-10-CM

## 2021-10-02 DIAGNOSIS — I10 Essential (primary) hypertension: Secondary | ICD-10-CM | POA: Diagnosis not present

## 2021-10-02 DIAGNOSIS — E1142 Type 2 diabetes mellitus with diabetic polyneuropathy: Secondary | ICD-10-CM | POA: Diagnosis not present

## 2021-10-02 DIAGNOSIS — E785 Hyperlipidemia, unspecified: Secondary | ICD-10-CM

## 2021-10-02 DIAGNOSIS — I6381 Other cerebral infarction due to occlusion or stenosis of small artery: Secondary | ICD-10-CM | POA: Diagnosis not present

## 2021-10-02 LAB — BASIC METABOLIC PANEL
BUN: 56 mg/dL — ABNORMAL HIGH (ref 6–23)
CO2: 29 mEq/L (ref 19–32)
Calcium: 9.4 mg/dL (ref 8.4–10.5)
Chloride: 95 mEq/L — ABNORMAL LOW (ref 96–112)
Creatinine, Ser: 2.3 mg/dL — ABNORMAL HIGH (ref 0.40–1.20)
GFR: 18.21 mL/min — ABNORMAL LOW (ref >60.00)
Glucose, Bld: 349 mg/dL — ABNORMAL HIGH (ref 70–99)
Potassium: 4.2 mEq/L (ref 3.5–5.1)
Sodium: 134 mEq/L — ABNORMAL LOW (ref 135–145)

## 2021-10-02 MED ORDER — GABAPENTIN 100 MG PO CAPS: 100.0000 mg | ORAL_CAPSULE | Freq: Three times a day (TID) | ORAL | 5 refills | Status: DC

## 2021-10-02 NOTE — Patient Instructions (Signed)
Increase gabapentin to 100mg  three times daily.  Check basic metabolic panel Follow up 6 months.

## 2021-10-08 ENCOUNTER — Ambulatory Visit: Payer: Medicare HMO | Admitting: Urology

## 2021-10-08 ENCOUNTER — Encounter: Payer: Self-pay | Admitting: Urology

## 2021-10-08 VITALS — BP 141/76 | HR 81 | Ht 63.0 in | Wt 193.0 lb

## 2021-10-08 DIAGNOSIS — N3941 Urge incontinence: Secondary | ICD-10-CM | POA: Diagnosis not present

## 2021-10-08 DIAGNOSIS — Z87440 Personal history of urinary (tract) infections: Secondary | ICD-10-CM | POA: Insufficient documentation

## 2021-10-08 LAB — URINALYSIS, ROUTINE W REFLEX MICROSCOPIC
Bilirubin, UA: NEGATIVE
Glucose, UA: NEGATIVE
Ketones, UA: NEGATIVE
Leukocytes,UA: NEGATIVE
Nitrite, UA: NEGATIVE
RBC, UA: NEGATIVE
Specific Gravity, UA: 1.01 (ref 1.005–1.030)
Urobilinogen, Ur: 0.2 mg/dL (ref 0.2–1.0)
pH, UA: 5 (ref 5.0–7.5)

## 2021-10-08 LAB — MICROSCOPIC EXAMINATION
RBC, Urine: NONE SEEN /hpf (ref 0–2)
Renal Epithel, UA: NONE SEEN /hpf

## 2021-10-08 LAB — BLADDER SCAN AMB NON-IMAGING: Scan Result: 1

## 2021-10-08 MED ORDER — GEMTESA 75 MG PO TABS: 75.0000 mg | ORAL_TABLET | Freq: Every day | ORAL | 11 refills | Status: DC

## 2021-10-08 MED ORDER — GEMTESA 75 MG PO TABS: 75.0000 mg | ORAL_TABLET | Freq: Every day | ORAL | 3 refills | Status: DC

## 2021-10-08 NOTE — Progress Notes (Signed)
post void residual =1mL 

## 2021-10-08 NOTE — Progress Notes (Signed)
Assessment: 1. Urge incontinence   2. History of UTI     Plan: Continue Gemtesa 75 mg daily.  Samples and rx given.   Recommend that she contact Dr. Theador Hawthorne regarding her recent increase in Cr. Return to office in 3 months  Chief Complaint:  Chief Complaint  Patient presents with   Urinary Incontinence    History of Present Illness:  Amy Kelly is a 88 y.o. year old female who is seen for further evaluation of urinary incontinence.  At her initial visit in June 2023, she reported symptoms of frequency, urgency, and nocturia x3.  She had urinary incontinence associated with urgency, occurring both daytime and nighttime.  She was using 2-3 poise pads per day.  No stress incontinence.  She has a history of UTIs and was last treated in April 2023.  Urine culture from 4/23 grew >100 K E. coli. No current UTI symptoms.  She has not had any gross hematuria or flank pain.   She had not tried any medical therapy for her bladder symptoms. She does report problems with constipation.  No fecal incontinence.  She has a history of chronic renal disease.  Renal ultrasound from 5/23 showed multiple renal cysts bilaterally and thinning of the renal cortex bilaterally.  PVR = 0 mL in June 2023. She was given a trial of Gemtesa 75 mg daily.  She returns today for follow-up.  She noted significant improvement in her incontinence with the Gemtesa.  She had decreased frequency, urgency, and nocturia.  Her incontinence episodes decreased significantly as well.  No side effects from the medication.  No dysuria or gross hematuria. Her recent creatinine from 10/02/2021 was increased to 2.30.  Portions of the above documentation were copied from a prior visit for review purposes only.   Past Medical History:  Past Medical History:  Diagnosis Date   Anxiety and depression    At risk for falls    Coronary artery disease    Diabetes mellitus ([place]) 11/08/2020   Diabetes mellitus without complication  (HCC)    GERD (gastroesophageal reflux disease)    History of skin cancer    Right shin   HOH (hard of hearing)    Hyperlipidemia    Hypertension    Hypothyroidism    Stroke [facility])     Past Surgical History:  Past Surgical History:  Procedure Laterality Date   CARDIAC CATHETERIZATION     [phone]   CARPAL TUNNEL RELEASE Left    CHOLECYSTECTOMY  1967   DIAGNOSTIC MAMMOGRAM     HERNIA REPAIR     HYSTERECTOMY ABDOMINAL WITH SALPINGO-OOPHORECTOMY  1960   MASTECTOMY Right 2012    Allergies:  Allergies  Allergen Reactions   Penicillins Diarrhea and Nausea Only   Prednisone Diarrhea and Nausea Only   Sulfa Antibiotics Diarrhea and Nausea Only    Family History:  No family history on file.  Social History:  Social History   Tobacco Use   Smoking status: Never    Passive exposure: Never   Smokeless tobacco: Never  Vaping Use   Vaping Use: Never used  Substance Use Topics   Alcohol use: Never   Drug use: Never    ROS: Constitutional:  Negative for fever, chills, weight loss CV: Negative for chest pain, previous MI, hypertension Respiratory:  Negative for shortness of breath, wheezing, sleep apnea, frequent cough GI:  Negative for nausea, vomiting, bloody stool, GERD  Physical exam: BP (!) 141/76   Pulse 81   Ht 5'  3" (1.6 m)   Wt 193 lb (87.5 kg)   BMI 34.19 kg/m  GENERAL APPEARANCE:  Well appearing, well developed, well nourished, NAD HEENT:  Atraumatic, normocephalic, oropharynx clear NECK:  Supple without lymphadenopathy or thyromegaly ABDOMEN:  Soft, non-tender, no masses EXTREMITIES:  Moves all extremities well, without clubbing, cyanosis, or edema NEUROLOGIC:  Alert and oriented x 3, normal gait, CN II-XII grossly intact MENTAL STATUS:  appropriate BACK:  Non-tender to palpation, No CVAT SKIN:  Warm, dry, and intact  Results: U/A:  0-5 WBC  PVR = 1 ml

## 2021-11-01 ENCOUNTER — Telehealth: Payer: Self-pay

## 2021-11-01 DIAGNOSIS — N3941 Urge incontinence: Secondary | ICD-10-CM

## 2021-11-01 MED ORDER — SOLIFENACIN SUCCINATE 5 MG PO TABS: 5.0000 mg | ORAL_TABLET | Freq: Every day | ORAL | 3 refills | Status: DC

## 2021-11-01 NOTE — Telephone Encounter (Signed)
Son called and made aware of medication change due insurance.

## 2021-12-11 ENCOUNTER — Encounter (INDEPENDENT_AMBULATORY_CARE_PROVIDER_SITE_OTHER): Payer: Self-pay | Admitting: *Deleted

## 2021-12-17 ENCOUNTER — Telehealth: Payer: Self-pay

## 2021-12-17 NOTE — Telephone Encounter (Signed)
Triage call came in on 09/29, patient is having issues with dry mouth with solifencin rx, do you have any recommendations.  Please advise.

## 2021-12-17 NOTE — Telephone Encounter (Signed)
FYI I spoke with the son, he had her stop the medication and had started her back on the [place].  He was not sure if the vesicare was helping her symptoms.  He stated he will keep her on the Gemtesa until they can discuss alternatives at her next apt with you.

## 2022-01-03 ENCOUNTER — Ambulatory Visit: Payer: Medicare HMO | Admitting: Urology

## 2022-01-03 ENCOUNTER — Encounter: Payer: Self-pay | Admitting: Urology

## 2022-01-03 VITALS — BP 147/84 | HR 77 | Ht 63.0 in | Wt 193.0 lb

## 2022-01-03 DIAGNOSIS — Z87440 Personal history of urinary (tract) infections: Secondary | ICD-10-CM | POA: Diagnosis not present

## 2022-01-03 DIAGNOSIS — N3941 Urge incontinence: Secondary | ICD-10-CM

## 2022-01-03 MED ORDER — FESOTERODINE FUMARATE ER 4 MG PO TB24: 4.0000 mg | ORAL_TABLET | Freq: Every day | ORAL | 11 refills | Status: DC

## 2022-01-03 NOTE — Progress Notes (Signed)
Assessment: 1. Urge incontinence   2. History of UTI    Plan: Trial of Toviaz 4 mg daily.  Rx sent. Samples of Gemtesa 75 mg daily.  Call with results of medication in 1 month Return to office in 3 months   Chief Complaint:  Chief Complaint  Patient presents with   Urinary Incontinence    History of Present Illness:  Amy Kelly is a 88 y.o. year old female who is seen for further evaluation of urinary incontinence.  At her initial visit in June 2023, she reported symptoms of frequency, urgency, and nocturia x3.  She had urinary incontinence associated with urgency, occurring both daytime and nighttime.  She was using 2-3 poise pads per day.  No stress incontinence.  She has a history of UTIs and was last treated in April 2023.  Urine culture from 4/23 grew >100 K E. coli. No current UTI symptoms.  She has not had any gross hematuria or flank pain.   She had not tried any medical therapy for her bladder symptoms. She does report problems with constipation.  No fecal incontinence.  She has a history of chronic renal disease.  Renal ultrasound from 5/23 showed multiple renal cysts bilaterally and thinning of the renal cortex bilaterally.  PVR = 0 mL in June 2023. She was given a trial of Gemtesa 75 mg daily. At her visit in 7/23, she had noted significant improvement in her incontinence with the [facility] with decreased frequency, urgency, and nocturia.  Her incontinence episodes decreased significantly as well.  No side effects from the medication.  No dysuria or gross hematuria. Her recent creatinine from 10/02/2021 was increased to 2.30. She was changed to Vesicare due to insurance coverage.  She discontinued the Vesicare due to significant dry mouth and loss of taste.  She returns today for follow-up.  She has since resumed the [place].  She reports improvement in her urinary incontinence with the Gemtesa.  She has occasional frequency, urgency.  No dysuria or gross  hematuria.  Portions of the above documentation were copied from a prior visit for review purposes only.   Past Medical History:  Past Medical History:  Diagnosis Date   Anxiety and depression    At risk for falls    Coronary artery disease    Diabetes mellitus ([place]) 11/08/2020   Diabetes mellitus without complication (HCC)    GERD (gastroesophageal reflux disease)    History of skin cancer    Right shin   HOH (hard of hearing)    Hyperlipidemia    Hypertension    Hypothyroidism    Stroke [facility])     Past Surgical History:  Past Surgical History:  Procedure Laterality Date   CARDIAC CATHETERIZATION     [phone]   CARPAL TUNNEL RELEASE Left    CHOLECYSTECTOMY  1967   DIAGNOSTIC MAMMOGRAM     HERNIA REPAIR     HYSTERECTOMY ABDOMINAL WITH SALPINGO-OOPHORECTOMY  1960   MASTECTOMY Right 2012    Allergies:  Allergies  Allergen Reactions   Penicillins Diarrhea and Nausea Only   Prednisone Diarrhea and Nausea Only   Sulfa Antibiotics Diarrhea and Nausea Only    Family History:  No family history on file.  Social History:  Social History   Tobacco Use   Smoking status: Never    Passive exposure: Never   Smokeless tobacco: Never  Vaping Use   Vaping Use: Never used  Substance Use Topics   Alcohol use: Never   Drug use:  Never    ROS: Constitutional:  Negative for fever, chills, weight loss CV: Negative for chest pain, previous MI, hypertension Respiratory:  Negative for shortness of breath, wheezing, sleep apnea, frequent cough GI:  Negative for nausea, vomiting, bloody stool, GERD  Physical exam: BP (!) 147/84   Pulse 77   Ht 5\' 3"  (1.6 m)   Wt 193 lb (87.5 kg)   BMI 34.19 kg/m  GENERAL APPEARANCE:  Well appearing, well developed, well nourished, NAD HEENT:  Atraumatic, normocephalic, oropharynx clear NECK:  Supple without lymphadenopathy or thyromegaly ABDOMEN:  Soft, non-tender, no masses EXTREMITIES:  Moves all extremities well, without  clubbing, cyanosis, or edema NEUROLOGIC:  Alert and oriented x 3, CN II-XII grossly intact MENTAL STATUS:  appropriate BACK:  Non-tender to palpation, No CVAT SKIN:  Warm, dry, and intact  Results: No sample provided

## 2022-01-28 ENCOUNTER — Other Ambulatory Visit: Payer: Self-pay

## 2022-01-28 ENCOUNTER — Emergency Department (HOSPITAL_COMMUNITY)
Admission: EM | Admit: 2022-01-28 | Discharge: 2022-01-28 | Disposition: A | Discharge status: Home / Self Care | Payer: Medicare HMO | Attending: Emergency Medicine | Admitting: Emergency Medicine

## 2022-01-28 ENCOUNTER — Encounter (HOSPITAL_COMMUNITY): Payer: Self-pay

## 2022-01-28 DIAGNOSIS — N3001 Acute cystitis with hematuria: Secondary | ICD-10-CM | POA: Diagnosis not present

## 2022-01-28 DIAGNOSIS — Z794 Long term (current) use of insulin: Secondary | ICD-10-CM | POA: Diagnosis not present

## 2022-01-28 DIAGNOSIS — N189 Chronic kidney disease, unspecified: Secondary | ICD-10-CM | POA: Insufficient documentation

## 2022-01-28 DIAGNOSIS — R309 Painful micturition, unspecified: Secondary | ICD-10-CM | POA: Diagnosis present

## 2022-01-28 LAB — CBC WITH DIFFERENTIAL/PLATELET
Abs Immature Granulocytes: 0.05 10*3/uL (ref 0.00–0.07)
Basophils Absolute: 0.1 10*3/uL (ref 0.0–0.1)
Basophils Relative: 1 %
Eosinophils Absolute: 0.2 10*3/uL (ref 0.0–0.5)
Eosinophils Relative: 1 %
HCT: 34.2 % — ABNORMAL LOW (ref 36.0–46.0)
Hemoglobin: 11.1 g/dL — ABNORMAL LOW (ref 12.0–15.0)
Immature Granulocytes: 0 %
Lymphocytes Relative: 10 %
Lymphs Abs: 1.2 10*3/uL (ref 0.7–4.0)
MCH: 28.1 pg (ref 26.0–34.0)
MCHC: 32.5 g/dL (ref 30.0–36.0)
MCV: 86.6 fL (ref 80.0–100.0)
Monocytes Absolute: 1.1 10*3/uL — ABNORMAL HIGH (ref 0.1–1.0)
Monocytes Relative: 9 %
Neutro Abs: 9.5 10*3/uL — ABNORMAL HIGH (ref 1.7–7.7)
Neutrophils Relative %: 79 %
Platelets: 222 10*3/uL (ref 150–400)
RBC: 3.95 MIL/uL (ref 3.87–5.11)
RDW: 15.6 % — ABNORMAL HIGH (ref 11.5–15.5)
WBC: 12 10*3/uL — ABNORMAL HIGH (ref 4.0–10.5)
nRBC: 0 % (ref 0.0–0.2)

## 2022-01-28 LAB — URINALYSIS, ROUTINE W REFLEX MICROSCOPIC
Bilirubin Urine: NEGATIVE
Glucose, UA: NEGATIVE mg/dL
Ketones, ur: NEGATIVE mg/dL
Nitrite: NEGATIVE
Protein, ur: 100 mg/dL — AB
Specific Gravity, Urine: 1.005 (ref 1.005–1.030)
WBC, UA: >50 WBC/hpf — ABNORMAL HIGH (ref 0–5)
pH: 5 (ref 5.0–8.0)

## 2022-01-28 LAB — BASIC METABOLIC PANEL
Anion gap: 9 (ref 5–15)
BUN: 44 mg/dL — ABNORMAL HIGH (ref 8–23)
CO2: 27 mmol/L (ref 22–32)
Calcium: 8.8 mg/dL — ABNORMAL LOW (ref 8.9–10.3)
Chloride: 99 mmol/L (ref 98–111)
Creatinine, Ser: 2.18 mg/dL — ABNORMAL HIGH (ref 0.44–1.00)
GFR, Estimated: 21 mL/min — ABNORMAL LOW (ref >60)
Glucose, Bld: 227 mg/dL — ABNORMAL HIGH (ref 70–99)
Potassium: 4.2 mmol/L (ref 3.5–5.1)
Sodium: 135 mmol/L (ref 135–145)

## 2022-01-28 MED ORDER — SODIUM CHLORIDE 0.9 % IV SOLN
1.0000 g | Freq: Once | INTRAVENOUS | Status: AC
  Administered 2022-01-28: 1 g via INTRAVENOUS
  Filled 2022-01-28: qty 10

## 2022-01-28 MED ORDER — SODIUM CHLORIDE 0.9 % IV BOLUS
500.0000 mL | Freq: Once | INTRAVENOUS | Status: AC
  Administered 2022-01-28: 500 mL via INTRAVENOUS

## 2022-01-28 MED ORDER — CEPHALEXIN 500 MG PO CAPS: 500.0000 mg | ORAL_CAPSULE | Freq: Four times a day (QID) | ORAL | 0 refills | Status: AC

## 2022-01-28 NOTE — ED Notes (Signed)
Pt able to ambulate w/o assistance to restroom. Pt able to micturate w/o complication prior to D/C.

## 2022-01-28 NOTE — ED Provider Notes (Signed)
[facility] EMERGENCY DEPARTMENT Provider Note   CSN: [id number] Arrival date & time: 01/28/22  1456     History  Chief Complaint  Patient presents with   Dysuria    Amy Kelly is a 88 y.o. female presenting for evaluation of 10-day history of dysuria, describing urgency and pain with urination since her symptoms began.  She describes urinating a very small amount of urine but very frequently.  She went to her PCPs office last Friday but was unable to give a urine sample while there.  She denies nausea or vomiting, fevers or chills.  She denies flank pain, no history of kidney stones.  She states she last urinated around 8 AM this morning.  She drinks 32 ounces of water prior to arrival and currently does not feel like she can urinate.  The history is provided by the patient.       Home Medications Prior to Admission medications   Medication Sig Start Date End Date Taking? Authorizing Provider  cephALEXin (KEFLEX) 500 MG capsule Take 1 capsule (500 mg total) by mouth 4 (four) times daily for 7 days. 01/28/22 02/04/22 Yes Tyann Niehaus, Almyra Free, PA-C  amLODipine (NORVASC) 10 MG tablet Take 10 mg by mouth daily. 05/01/20   [provider]  ascorbic acid (VITAMIN C) 1000 MG tablet Take 1,000 mg by mouth daily.    [provider]  calcium-vitamin D (OSCAL WITH D) 500-200 MG-UNIT tablet Take 2 tablets by mouth daily with breakfast.    [provider]  Carbidopa-Levodopa ER (SINEMET CR) 25-100 MG tablet controlled release Take 1 tablet by mouth daily.    [provider]  Cholecalciferol (VITAMIN D-3) 125 MCG (5000 UT) TABS Take 5,000 Units by mouth daily.    [provider]  clopidogrel (PLAVIX) 75 MG tablet Take 75 mg by mouth daily. 04/10/20   [provider]  doxazosin (CARDURA) 2 MG tablet Take by mouth. 12/27/20 12/22/21  [provider]  escitalopram (LEXAPRO) 10 MG tablet Take 10 mg by mouth daily. 10/16/20   [provider]   fesoterodine (TOVIAZ) 4 MG TB24 tablet Take 1 tablet (4 mg total) by mouth daily. 01/03/22   Stoneking, Reece Leader., MD  folic acid (FOLVITE) 062 MCG tablet Take 400 mcg by mouth daily.    [provider]  furosemide (LASIX) 40 MG tablet Take 1 tablet (40 mg total) by mouth every morning. For fluid 11/11/20   Roxan Hockey, MD  gabapentin (NEURONTIN) 100 MG capsule Take 1 capsule (100 mg total) by mouth 3 (three) times daily. 10/02/21   Tomi Likens, Adam R, DO  insulin aspart (NOVOLOG) 100 UNIT/ML FlexPen Inject 0-6 Units into the skin at bedtime. Dose per sliding scale 04/10/20   [provider]  insulin glargine (LANTUS SOLOSTAR) 100 UNIT/ML Solostar Pen Inject 5 Units into the skin daily. 05/02/20   [provider]  levothyroxine (SYNTHROID) 50 MCG tablet Take by mouth. 11/08/20   [provider]  losartan (COZAAR) 100 MG tablet Take by mouth. 11/13/20   [provider]  Multiple Vitamin (MULTIVITAMIN ADULT PO) Take 1 tablet by mouth daily. 05/30/20   [provider]  omeprazole (PRILOSEC) 40 MG capsule Take by mouth.    [provider]  potassium chloride (KLOR-CON) 10 MEQ tablet Take 1 tablet (10 mEq total) by mouth daily. Take While taking Lasix/furosemide 11/11/20   Roxan Hockey, MD  rosuvastatin (CRESTOR) 40 MG tablet Take 40 mg by mouth at bedtime. 05/12/20   [provider]  vitamin B-12 (CYANOCOBALAMIN) 500 MCG tablet Take 500 mcg by mouth daily.    [provider]      Allergies    Penicillins, Prednisone, and Sulfa antibiotics    Review of Systems   Review of Systems  Constitutional:  Negative for chills and fever.  HENT:  Negative for congestion and sore throat.   Eyes: Negative.   Respiratory:  Negative for chest tightness and shortness of breath.   Cardiovascular:  Negative for chest pain.  Gastrointestinal:  Negative for abdominal pain, nausea and vomiting.  Genitourinary:  Positive for decreased  urine volume, dysuria, frequency and urgency.  Musculoskeletal:  Negative for arthralgias, joint swelling and neck pain.  Skin: Negative.  Negative for rash and wound.  Neurological:  Negative for dizziness, weakness, light-headedness, numbness and headaches.  Psychiatric/Behavioral: Negative.      Physical Exam Updated Vital Signs BP (!) 165/69   Pulse 70   Temp 97.6 F (36.4 C) (Oral)   Resp 18   Ht 5\' 3"  (1.6 m)   Wt 88.5 kg   SpO2 94%   BMI 34.54 kg/m  Physical Exam Vitals and nursing note reviewed.  Constitutional:      Appearance: She is well-developed.  HENT:     Head: Normocephalic and atraumatic.  Eyes:     Conjunctiva/sclera: Conjunctivae normal.  Cardiovascular:     Rate and Rhythm: Normal rate and regular rhythm.     Heart sounds: Normal heart sounds.  Pulmonary:     Effort: Pulmonary effort is normal.     Breath sounds: Normal breath sounds. No wheezing.  Abdominal:     General: Bowel sounds are normal.     Palpations: Abdomen is soft.     Tenderness: There is no abdominal tenderness. There is no right CVA tenderness, left CVA tenderness or guarding.  Musculoskeletal:        General: Normal range of motion.     Cervical back: Normal range of motion.  Skin:    General: Skin is warm and dry.  Neurological:     Mental Status: She is alert.     ED Results / Procedures / Treatments   Labs (all labs ordered are listed, but only abnormal results are displayed) Labs Reviewed  CBC WITH DIFFERENTIAL/PLATELET - Abnormal; Notable for the following components:      Result Value   WBC 12.0 (*)    Hemoglobin 11.1 (*)    HCT 34.2 (*)    RDW 15.6 (*)    Neutro Abs 9.5 (*)    Monocytes Absolute 1.1 (*)    All other components within normal limits  BASIC METABOLIC PANEL - Abnormal; Notable for the following components:   Glucose, Bld 227 (*)    BUN 44 (*)    Creatinine, Ser 2.18 (*)    Calcium 8.8 (*)    GFR, Estimated 21 (*)    All other components within  normal limits  URINALYSIS, ROUTINE W REFLEX MICROSCOPIC - Abnormal; Notable for the following components:   APPearance CLOUDY (*)    Hgb urine dipstick LARGE (*)    Protein, ur 100 (*)    Leukocytes,Ua LARGE (*)    WBC, UA >50 (*)    Bacteria, UA MANY (*)    All other components within normal limits  URINE CULTURE    EKG None  Radiology No results found.  Procedures Procedures    Medications Ordered in ED Medications  sodium chloride 0.9 % bolus 500 mL (0  mLs Intravenous Stopped 01/28/22 1839)  cefTRIAXone (ROCEPHIN) 1 g in sodium chloride 0.9 % 100 mL IVPB (0 g Intravenous Stopped 01/28/22 1839)    ED Course/ Medical Decision Making/ A&P                           Medical Decision Making Patient presenting with a nearly 2-week history of dysuria type symptoms, no fevers, no flank pain, no history of kidney stones.  She states she last urinated this morning after waking, not since.  However son at the bedside states that she has reduced her fluid intake.  He has pushed cranberry juice but she has been resistant.  On initial presentation a bladder scan was completed with 416 mL.  A Foley catheter was placed and initially drained around 400 mL of urine.  She does not appear to be retaining  or having significant urinary retention.   She was given a IV dose of Rocephin once her urinalysis confirmed UTI.  Amount and/or Complexity of Data Reviewed Labs: ordered.    Details: Labs significant for WBC count of 12.0.  Her creatinine is 2.18, in comparison to prior labs this is a stable finding.  UA significant for UTI, culture was ordered.  Risk Prescription drug management.           Final Clinical Impression(s) / ED Diagnoses Final diagnoses:  Acute cystitis with hematuria  Chronic renal impairment, unspecified CKD stage    Rx / DC Orders ED Discharge Orders          Ordered    cephALEXin (KEFLEX) 500 MG capsule  4 times daily        01/28/22 1908               Evalee Jefferson, PA-C 01/28/22 1918    Fredia Sorrow, MD 01/29/22 [phone]

## 2022-01-28 NOTE — Discharge Instructions (Signed)
Take the entire course of the antibiotics prescribed and your next dose tomorrow morning as the IV dose you received here will cover you for tonight.  Make sure you are drinking plenty of fluids.  Plan to see your doctor for recheck within a week.

## 2022-01-28 NOTE — ED Triage Notes (Addendum)
Pt presents to ED with difficulty urinating since last Friday. States she has had UTI x 2 weeks, been trying to get sample to office but unable to. States she has had 32oz of water in last hour and unable to void.  Bladder scan in triage 416 ml

## 2022-01-31 LAB — URINE CULTURE: Culture: >=100000 — AB

## 2022-02-01 ENCOUNTER — Telehealth (HOSPITAL_BASED_OUTPATIENT_CLINIC_OR_DEPARTMENT_OTHER): Payer: Self-pay

## 2022-02-01 NOTE — Telephone Encounter (Signed)
Post ED Visit - Positive Culture Follow-up  Culture report reviewed by antimicrobial stewardship pharmacist: [place] Team [x]  Gena Fray, Pharm.D. []  Heide Guile, Pharm.D., BCPS AQ-ID []  Parks Neptune, Pharm.D., BCPS []  Alycia Rossetti, Pharm.D., BCPS []  [place], Pharm.D., BCPS, AAHIVP []  Legrand Como, Pharm.D., BCPS, AAHIVP []  Salome Arnt, PharmD, BCPS []  Johnnette Gourd, PharmD, BCPS []  Hughes Better, PharmD, BCPS []  Leeroy Cha, PharmD []  Laqueta Linden, PharmD, BCPS []  Albertina Parr, PharmD  [place] Team []  Leodis Sias, PharmD []  Lindell Spar, PharmD []  Royetta Asal, PharmD []  Graylin Shiver, Rph []  Rema Fendt) Glennon Mac, PharmD []  Arlyn Dunning, PharmD []  Netta Cedars, PharmD []  Dia Sitter, PharmD []  Leone Haven, PharmD []  Gretta Arab, PharmD []  Theodis Shove, PharmD []  Peggyann Juba, PharmD []  Reuel Boom, PharmD   Positive urine culture Treated with Cephalexin, organism sensitive to the same and no further patient follow-up is required at this time.  Glennon Hamilton 02/01/2022, 10:23 AM

## 2022-02-21 ENCOUNTER — Ambulatory Visit (INDEPENDENT_AMBULATORY_CARE_PROVIDER_SITE_OTHER): Payer: Medicare HMO | Admitting: Gastroenterology

## 2022-02-21 ENCOUNTER — Encounter (INDEPENDENT_AMBULATORY_CARE_PROVIDER_SITE_OTHER): Payer: Self-pay | Admitting: Gastroenterology

## 2022-02-21 VITALS — BP 131/83 | HR 76 | Temp 97.8°F | Ht 63.0 in | Wt 201.3 lb

## 2022-02-21 DIAGNOSIS — R1310 Dysphagia, unspecified: Secondary | ICD-10-CM

## 2022-02-21 DIAGNOSIS — K5900 Constipation, unspecified: Secondary | ICD-10-CM

## 2022-02-21 MED ORDER — FAMOTIDINE 20 MG PO TABS: 20.0000 mg | ORAL_TABLET | Freq: Every day | ORAL | 2 refills | Status: DC

## 2022-02-21 NOTE — Progress Notes (Addendum)
Referring Provider: Jonathon Bellows, DO Primary Care Physician:  Jonathon Bellows, DO Primary GI Physician: new   Chief Complaint  Patient presents with   Dysphagia    New patient. Arrives with son Amy Kelly. Having dysphagia.    [name] with past medical history of anxiety, depression, CAD, DM, GERD, HLD, HTN, hypothyroidism.   Patient presenting today for dysphagia.  She reports history of dysphagia, had esophageal dilation last in 2016. She notes that dysphagia persisted after that but is worse recently. 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 She can get choked on liquids and foods almost every day. She feels that substance sticks at sternal notch. Food will eventually pass on down after a while. She is on prilosec for her GERD, they switched it to night time dosing as she was waking up with symptoms. She notes she has a little bit of heartburn almost daily. Does not take anything else for this. She has been on omeprazole maybe 1-2 years, has been on other PPI therapies as well. No early satiety. No nausea or vomiting. No rectal bleeding or melena.   She has some hemorrhoids that cause her itching. She has BMs 1-2x/day. She has some constipation at times. She is using stool softeners as needed.   She reports some ongoing abdominal soreness, worse on the Left side as she reports some kidney problems. Sometimes has some Left flank pain. She has had abdominal pain on and off chronically.   NSAID use: rarely uses  Social hx: no etoh or tobacco  Fam hx: mother had stomach cancer many years ago   Last Colonoscopy:>10 years ago  Last Endoscopy:2016, esophageal dilation  Recommendations:    Past Medical History:  Diagnosis Date   Anxiety and depression    At risk for falls    Coronary artery disease    Diabetes mellitus (HCC) 11/08/2020   Diabetes mellitus without complication (HCC)    GERD (gastroesophageal reflux disease)     History of skin cancer    Right shin   HOH (hard of hearing)    Hyperlipidemia    Hypertension    Hypothyroidism    Stroke [facility])     Past Surgical History:  Procedure Laterality Date   CARDIAC CATHETERIZATION     [phone]   CARPAL TUNNEL RELEASE Left    CHOLECYSTECTOMY  1967   DIAGNOSTIC MAMMOGRAM     HERNIA REPAIR     HYSTERECTOMY ABDOMINAL WITH SALPINGO-OOPHORECTOMY  1960   MASTECTOMY Right 2012    Current Outpatient Medications  Medication Sig Dispense Refill   amLODipine (NORVASC) 10 MG tablet Take 10 mg by mouth daily.     ascorbic acid (VITAMIN C) 1000 MG tablet Take 1,000 mg by mouth daily.     Carbidopa-Levodopa ER (SINEMET CR) 25-100 MG tablet controlled release Take 1 tablet by mouth daily.     Cholecalciferol (VITAMIN D-3) 125 MCG (5000 UT) TABS Take 5,000 Units by mouth daily.     clopidogrel (PLAVIX) 75 MG tablet Take 75 mg by mouth daily.     doxazosin (CARDURA) 2 MG tablet Take by mouth.     escitalopram (LEXAPRO) 10 MG tablet Take 10 mg by mouth daily.     fesoterodine (TOVIAZ) 4 MG TB24 tablet Take 1 tablet (4 mg total) by mouth daily. 30 tablet 11   folic acid (FOLVITE) 800 MCG tablet Take 400 mcg by mouth daily.  furosemide (LASIX) 20 MG tablet Take 20 mg by mouth daily. Takes 20mg  along with 40mg  daily     furosemide (LASIX) 40 MG tablet Take 1 tablet (40 mg total) by mouth every morning. For fluid 30 tablet 5   gabapentin (NEURONTIN) 100 MG capsule Take 1 capsule (100 mg total) by mouth 3 (three) times daily. 90 capsule 5   insulin aspart (NOVOLOG) 100 UNIT/ML FlexPen Inject 0-6 Units into the skin at bedtime. Dose per sliding scale     insulin glargine (LANTUS SOLOSTAR) 100 UNIT/ML Solostar Pen Inject 5 Units into the skin daily.     levothyroxine (SYNTHROID) 50 MCG tablet Take by mouth.     Multiple Vitamin (MULTIVITAMIN ADULT PO) Take 1 tablet by mouth daily.     Olopatadine HCl (PATADAY OP) Apply to eye. Once in the mornings in left eye      omeprazole (PRILOSEC) 40 MG capsule Take 40 mg by mouth daily.     OVER THE COUNTER MEDICATION Calcium 250 mg one daily     potassium chloride (KLOR-CON) 10 MEQ tablet Take 1 tablet (10 mEq total) by mouth daily. Take While taking Lasix/furosemide 30 tablet 2   rosuvastatin (CRESTOR) 40 MG tablet Take 40 mg by mouth at bedtime.     vitamin B-12 (CYANOCOBALAMIN) 500 MCG tablet Take 500 mcg by mouth daily.     No current facility-administered medications for this visit.    Allergies as of 02/21/2022 - Review Complete 02/21/2022  Allergen Reaction Noted   Penicillins Diarrhea and Nausea Only 07/17/2020   Prednisone Diarrhea and Nausea Only 07/17/2020   Sulfa antibiotics Diarrhea and Nausea Only 07/17/2020    No family history on file.  Social History   Socioeconomic History   Marital status: Widowed    Spouse name: Not on file   Number of children: Not on file   Years of education: Not on file   Highest education level: Not on file  Occupational History   Not on file  Tobacco Use   Smoking status: Never    Passive exposure: Never   Smokeless tobacco: Never  Vaping Use   Vaping Use: Never used  Substance and Sexual Activity   Alcohol use: Never   Drug use: Never   Sexual activity: Not on file  Other Topics Concern   Not on file  Social History Narrative   Right handed   Social Determinants of Health   Financial Resource Strain: Not on file  Food Insecurity: Not on file  Transportation Needs: Not on file  Physical Activity: Not on file  Stress: Not on file  Social Connections: Not on file   Review of systems General: negative for malaise, night sweats, fever, chills, weight los Neck: Negative for lumps, goiter, pain and significant neck swelling Resp: Negative for cough, wheezing, dyspnea at rest CV: Negative for chest pain, leg swelling, palpitations, orthopnea GI: denies melena, hematochezia, nausea, vomiting, diarrhea, odyonophagia, early satiety or  unintentional weight loss. +dysphagia +hemorrhoids +constipation +left abdominal/flank pain MSK: Negative for joint pain or swelling, back pain, and muscle pain. Derm: Negative for itching or rash Psych: Denies depression, anxiety, memory loss, confusion. No homicidal or suicidal ideation.  Heme: Negative for prolonged bleeding, bruising easily, and swollen nodes. Endocrine: Negative for cold or heat intolerance, polyuria, polydipsia and goiter. Neuro: negative for tremor, gait imbalance, syncope and seizures. The remainder of the review of systems is noncontributory.  Physical Exam: BP 131/83 (BP Location: Right Arm, Patient Position: Sitting, Cuff Size:  Large)   Pulse 76   Temp 97.8 F (36.6 C) (Oral)   Ht 5\' 3"  (1.6 m)   Wt 201 lb 4.8 oz (91.3 kg)   BMI 35.66 kg/m  General:   Alert and oriented. No distress noted. Pleasant and cooperative.  Head:  Normocephalic and atraumatic. Eyes:  Conjuctiva clear without scleral icterus. Mouth:  Oral mucosa pink and moist. Good dentition. No lesions. Heart: Normal rate and rhythm, s1 and s2 heart sounds present.  Lungs: Clear lung sounds in all lobes. Respirations equal and unlabored. Abdomen:  +BS, soft, non-tender and non-distended. No rebound or guarding. No HSM or masses noted. Derm: No palmar erythema or jaundice Msk:  Symmetrical without gross deformities. Normal posture. Extremities:  Without edema. Neurologic:  Alert and  oriented x4 Psych:  Alert and cooperative. Normal mood and affect.  Invalid input(s): "6 MONTHS"   ASSESSMENT: Lynneah Nealey is a 88 y.o. female presenting today for dysphagia/gerd and with constipation.  Dysphagia: history of esophageal dilation, last in 2016. Having issues with both liquids and foods almost daily. GERD is not well controlled on Omeprazole 40mg  though she is taking this at night. Recommend she take omeprazole in the morning and start pepcid 20mg  QHS. Also recommend proceeding with EGD +/- dilation  for further evaluation of her symptoms as we cannot rule out esophageal ring, web, stricture, stenosis. Needs to practice good reflux precautions as well.  She notes some issues with hemorrhoids causing itching, secondary to chronic constipation. Constipation may be secondary to hypothyroidism, last TSH in 2022 was elevated, she is currently on synthroid daily. Encouraged her to start miralax, increase fruits, veggies and whole grains, especially kiwi and prunes.    PLAN:  Schedule EGD +/- dilation, on plavix- ASA III ENDO 3 2. Take omeprazole 40mg  in the morning 3. Good water intake, fruits veggies and whole grains 4. Start taking Miralax 1 capful every day for one week. If bowel movements do not improve, increase to 1 capful every 12 hours. If after two weeks there is no improvement, increase to 1 capful every 8 hours 5. Pepcid 20mg  QHS 6. Reflux precautions    Follow Up: 3 months   Caytlin Better L. Jeanmarie Hubert, MSN, APRN, AGNP-C Adult-Gerontology Nurse Practitioner [facility]  I have reviewed the note and agree with the APP's assessment as described in this progress note  If patient not found to have significant intraluminal patology, will proceed with MBS given history of stroke  Katrinka Blazing, MD Gastroenterology and Hepatology [facility]

## 2022-02-21 NOTE — Patient Instructions (Addendum)
We will Schedule EGD  Take omeprazole 40mg  in the morning, will add Pepcid 20mg  in the evening for acid reflux  Avoid greasy, spicy, fried, citrus foods, and be mindful that caffeine, carbonated drinks, chocolate and alcohol can increase reflux symptoms Stay upright 2-3 hours after eating, prior to lying down and avoid eating late in the evenings. Make sure you have Good water intake, fruits veggies and whole grains Start taking Miralax 1 capful every day for one week. If bowel movements do not improve, increase to 1 capful every 12 hours. If after two weeks there is no improvement, increase to 1 capful every 8 hours  Follow up 3 months

## 2022-02-24 DIAGNOSIS — K5900 Constipation, unspecified: Secondary | ICD-10-CM | POA: Insufficient documentation

## 2022-02-24 DIAGNOSIS — R1310 Dysphagia, unspecified: Secondary | ICD-10-CM | POA: Insufficient documentation

## 2022-02-25 ENCOUNTER — Telehealth (INDEPENDENT_AMBULATORY_CARE_PROVIDER_SITE_OTHER): Payer: Self-pay | Admitting: *Deleted

## 2022-02-25 NOTE — Telephone Encounter (Signed)
LMOVM to call back to schedule EGD +/-DIL w/ propfol asa 3, Dr. Jenetta Downer, hold plavix x 5 days. No clearance needed to hold per [facility]

## 2022-02-26 NOTE — Telephone Encounter (Signed)
Spoke with pt son. He has scheduled procedure for 04/09/22 at 2:30pm. He reports pt will have [facility] Medicare 03/18/22. He will call us once it is active to have this entered. Aware will send instructions/pre-op. Ok to hold plavix x 5 days.

## 2022-02-27 ENCOUNTER — Encounter: Payer: Self-pay | Admitting: *Deleted

## 2022-04-03 NOTE — Patient Instructions (Addendum)
Argentina Kosch  04/03/2022     @PREFPERIOPPHARMACY @   Your procedure is scheduled on  04/09/2022.   Report to [facility] at   11:30 AM    Call this number if you have problems the morning of surgery:  [phone]  If you experience any cold or flu symptoms such as cough, fever, chills, shortness of breath, etc. between now and your scheduled surgery, please notify us at the above number.   Remember:  Do not eat after 6 pm on 04/08/2022.   You may drink clear liquids until 1030 am on 04/09/2022.        Clear liquids allowed are:                    Water, Juice (No red color; non-citric and without pulp; diabetics please choose diet or no sugar options), Carbonated beverages (diabetics please choose diet or no sugar options), Clear Tea (No creamer, milk, or cream, including half & half and powdered creamer), Black Coffee Only (No creamer, milk or cream, including half & half and powdered creamer), Plain Jell-O Only (No red color; diabetics please choose no sugar options), Clear Sports drink (No red color; diabetics please choose diet or no sugar options), and Plain Popsicles Only (No red color; diabetics please choose no sugar options)       If your take your insulin before bed, take 1/2 of your usual dose the night before your procedure.     DO NOT take any medications for diabetes the morning of your procedure.     Take these medicines the morning of surgery with A SIP OF WATER       amlodipine, lexapro, gabapentin, levothyroxine, claritin, omeprazole, gentesa.     Do not wear jewelry, make-up or nail polish.  Do not wear lotions, powders, or perfumes, or deodorant.  Do not shave 48 hours prior to surgery.  Men may shave face and neck.  Do not bring valuables to the hospital.  [facility] is not responsible for any belongings or valuables.  Contacts, dentures or bridgework may not be worn into surgery.  Leave your suitcase in the car.  After surgery it may be brought  to your room.  For patients admitted to the hospital, discharge time will be determined by your treatment team.  Patients discharged the day of surgery will not be allowed to drive home and must have someone with them for 24 hours.    Special instructions:   DO NOT smoke tobacco or vape for 24 hours before your procedure.  Please read over the following fact sheets that you were given. Anesthesia Post-op Instructions and Care and Recovery After Surgery      Upper Endoscopy, Adult, Care After After the procedure, it is common to have a sore throat. It is also common to have: Mild stomach pain or discomfort. Bloating. Nausea. Follow these instructions at home: The instructions below may help you care for yourself at home. Your health care provider may give you more instructions. If you have questions, ask your health care provider. If you were given a sedative during the procedure, it can affect you for several hours. Do not drive or operate machinery until your health care provider says that it is safe. If you will be going home right after the procedure, plan to have a responsible adult: Take you home from the hospital or clinic. You will not be allowed to drive. Care for you  for the time you are told. Follow instructions from your health care provider about what you may eat and drink. Return to your normal activities as told by your health care provider. Ask your health care provider what activities are safe for you. Take over-the-counter and prescription medicines only as told by your health care provider. Contact a health care provider if you: Have a sore throat that lasts longer than one day. Have trouble swallowing. Have a fever. Get help right away if you: Vomit blood or your vomit looks like coffee grounds. Have bloody, black, or tarry stools. Have a very bad sore throat or you cannot swallow. Have difficulty breathing or very bad pain in your chest or abdomen. These  symptoms may be an emergency. Get help right away. Call 911. Do not wait to see if the symptoms will go away. Do not drive yourself to the hospital. Summary After the procedure, it is common to have a sore throat, mild stomach discomfort, bloating, and nausea. If you were given a sedative during the procedure, it can affect you for several hours. Do not drive until your health care provider says that it is safe. Follow instructions from your health care provider about what you may eat and drink. Return to your normal activities as told by your health care provider. This information is not intended to replace advice given to you by your health care provider. Make sure you discuss any questions you have with your health care provider. Document Revised: 06/13/2021 Document Reviewed: 06/13/2021 Elsevier Patient Education  [place]. Esophageal Dilatation Esophageal dilatation, also called esophageal dilation, is a procedure to widen or open a blocked or narrowed part of the esophagus. The esophagus is the part of the body that moves food and liquid from the mouth to the stomach. You may need this procedure if: You have a buildup of scar tissue in your esophagus that makes it difficult, painful, or impossible to swallow. This can be caused by gastroesophageal reflux disease (GERD). You have cancer of the esophagus. There is a problem with how food moves through your esophagus. In some cases, you may need this procedure repeated at a later time to dilate the esophagus gradually. Tell a health care provider about: Any allergies you have. All medicines you are taking, including vitamins, herbs, eye drops, creams, and over-the-counter medicines. Any problems you or family members have had with anesthetic medicines. Any blood disorders you have. Any surgeries you have had. Any medical conditions you have. Any antibiotic medicines you are required to take before dental procedures. Whether you  are pregnant or may be pregnant. What are the risks? Generally, this is a safe procedure. However, problems may occur, including: Bleeding due to a tear in the lining of the esophagus. A hole, or perforation, in the esophagus. What happens before the procedure? Ask your health care provider about: Changing or stopping your regular medicines. This is especially important if you are taking diabetes medicines or blood thinners. Taking medicines such as aspirin and ibuprofen. These medicines can thin your blood. Do not take these medicines unless your health care provider tells you to take them. Taking over-the-counter medicines, vitamins, herbs, and supplements. Follow instructions from your health care provider about eating or drinking restrictions. Plan to have a responsible adult take you home from the hospital or clinic. Plan to have a responsible adult care for you for the time you are told after you leave the hospital or clinic. This is important. What happens  during the procedure? You may be given a medicine to help you relax (sedative). A numbing medicine may be sprayed into the back of your throat, or you may gargle the medicine. Your health care provider may perform the dilatation using various surgical instruments, such as: Simple dilators. This instrument is carefully placed in the esophagus to stretch it. Guided wire bougies. This involves using an endoscope to insert a wire into the esophagus. A dilator is passed over this wire to enlarge the esophagus. Then the wire is removed. Balloon dilators. An endoscope with a small balloon is inserted into the esophagus. The balloon is inflated to stretch the esophagus and open it up. The procedure may vary among health care providers and hospitals. What can I expect after the procedure? Your blood pressure, heart rate, breathing rate, and blood oxygen level will be monitored until you leave the hospital or clinic. Your throat may feel  slightly sore and numb. This will get better over time. You will not be allowed to eat or drink until your throat is no longer numb. When you are able to drink, urinate, and sit on the edge of the bed without nausea or dizziness, you may be able to return home. Follow these instructions at home: Take over-the-counter and prescription medicines only as told by your health care provider. If you were given a sedative during the procedure, it can affect you for several hours. Do not drive or operate machinery until your health care provider says that it is safe. Plan to have a responsible adult care for you for the time you are told. This is important. Follow instructions from your health care provider about any eating or drinking restrictions. Do not use any products that contain nicotine or tobacco, such as cigarettes, e-cigarettes, and chewing tobacco. If you need help quitting, ask your health care provider. Keep all follow-up visits. This is important. Contact a health care provider if: You have a fever. You have pain that is not relieved by medicine. Get help right away if: You have chest pain. You have trouble breathing. You have trouble swallowing. You vomit blood. You have black, tarry, or bloody stools. These symptoms may represent a serious problem that is an emergency. Do not wait to see if the symptoms will go away. Get medical help right away. Call your local emergency services (911 in the [place] Do not drive yourself to the hospital. Summary Esophageal dilatation, also called esophageal dilation, is a procedure to widen or open a blocked or narrowed part of the esophagus. Plan to have a responsible adult take you home from the hospital or clinic. For this procedure, a numbing medicine may be sprayed into the back of your throat, or you may gargle the medicine. Do not drive or operate machinery until your health care provider says that it is safe. This information is not intended  to replace advice given to you by your health care provider. Make sure you discuss any questions you have with your health care provider. Document Revised: 07/21/2019 Document Reviewed: 07/21/2019 Elsevier Patient Education  [place] After The following information offers guidance on how to care for yourself after your procedure. Your health care provider may also give you more specific instructions. If you have problems or questions, contact your health care provider. What can I expect after the procedure? After the procedure, it is common to have: Tiredness. Little or no memory about what happened during or after the procedure. Impaired  judgment when it comes to making decisions. Nausea or vomiting. Some trouble with balance. Follow these instructions at home: For the time period you were told by your health care provider:  Rest. Do not participate in activities where you could fall or become injured. Do not drive or use machinery. Do not drink alcohol. Do not take sleeping pills or medicines that cause drowsiness. Do not make important decisions or sign legal documents. Do not take care of children on your own. Medicines Take over-the-counter and prescription medicines only as told by your health care provider. If you were prescribed antibiotics, take them as told by your health care provider. Do not stop using the antibiotic even if you start to feel better. Eating and drinking Follow instructions from your health care provider about what you may eat and drink. Drink enough fluid to keep your urine pale yellow. If you vomit: Drink clear fluids slowly and in small amounts as you are able. Clear fluids include water, ice chips, low-calorie sports drinks, and fruit juice that has water added to it (diluted fruit juice). Eat light and bland foods in small amounts as you are able. These foods include bananas, applesauce, rice, lean meats, toast,  and crackers. General instructions  Have a responsible adult stay with you for the time you are told. It is important to have someone help care for you until you are awake and alert. If you have sleep apnea, surgery and some medicines can increase your risk for breathing problems. Follow instructions from your health care provider about wearing your sleep device: When you are sleeping. This includes during daytime naps. While taking prescription pain medicines, sleeping medicines, or medicines that make you drowsy. Do not use any products that contain nicotine or tobacco. These products include cigarettes, chewing tobacco, and vaping devices, such as e-cigarettes. If you need help quitting, ask your health care provider. Contact a health care provider if: You feel nauseous or vomit every time you eat or drink. You feel light-headed. You are still sleepy or having trouble with balance after 24 hours. You get a rash. You have a fever. You have redness or swelling around the IV site. Get help right away if: You have trouble breathing. You have new confusion after you get home. These symptoms may be an emergency. Get help right away. Call 911. Do not wait to see if the symptoms will go away. Do not drive yourself to the hospital. This information is not intended to replace advice given to you by your health care provider. Make sure you discuss any questions you have with your health care provider. Document Revised: 07/30/2021 Document Reviewed: 07/30/2021 Elsevier Patient Education  [place].

## 2022-04-04 ENCOUNTER — Encounter (HOSPITAL_COMMUNITY): Payer: Self-pay

## 2022-04-04 ENCOUNTER — Encounter (HOSPITAL_COMMUNITY)
Admission: RE | Admit: 2022-04-04 | Discharge: 2022-04-04 | Disposition: A | Discharge status: Home / Self Care | Payer: Medicare Other | Source: Ambulatory Visit | Attending: Gastroenterology | Admitting: Gastroenterology

## 2022-04-04 DIAGNOSIS — E1122 Type 2 diabetes mellitus with diabetic chronic kidney disease: Secondary | ICD-10-CM | POA: Diagnosis not present

## 2022-04-04 DIAGNOSIS — I129 Hypertensive chronic kidney disease with stage 1 through stage 4 chronic kidney disease, or unspecified chronic kidney disease: Secondary | ICD-10-CM | POA: Diagnosis not present

## 2022-04-04 DIAGNOSIS — Z01810 Encounter for preprocedural cardiovascular examination: Secondary | ICD-10-CM | POA: Insufficient documentation

## 2022-04-04 DIAGNOSIS — R809 Proteinuria, unspecified: Secondary | ICD-10-CM | POA: Diagnosis not present

## 2022-04-04 DIAGNOSIS — I10 Essential (primary) hypertension: Secondary | ICD-10-CM

## 2022-04-04 DIAGNOSIS — N189 Chronic kidney disease, unspecified: Secondary | ICD-10-CM | POA: Diagnosis not present

## 2022-04-04 DIAGNOSIS — E211 Secondary hyperparathyroidism, not elsewhere classified: Secondary | ICD-10-CM | POA: Diagnosis not present

## 2022-04-04 DIAGNOSIS — R32 Unspecified urinary incontinence: Secondary | ICD-10-CM | POA: Diagnosis not present

## 2022-04-05 ENCOUNTER — Ambulatory Visit: Payer: Medicare HMO | Admitting: Urology

## 2022-04-05 ENCOUNTER — Encounter: Payer: Self-pay | Admitting: Urology

## 2022-04-05 VITALS — BP 126/68 | HR 73

## 2022-04-05 DIAGNOSIS — Z87440 Personal history of urinary (tract) infections: Secondary | ICD-10-CM

## 2022-04-05 DIAGNOSIS — N3941 Urge incontinence: Secondary | ICD-10-CM

## 2022-04-05 LAB — BLADDER SCAN AMB NON-IMAGING: Scan Result: 145

## 2022-04-05 NOTE — Progress Notes (Unsigned)
post void residual=[id number] 

## 2022-04-05 NOTE — Progress Notes (Signed)
04/05/2022 1:04 PM   Arvil Persons 07-12-1928 [id number]  Referring provider: Sherrilee [place]illes, [place] [address]  Followup OAB   HPI: Amy Kelly is a 89yo here for followup for OAB and hx of UTI. She is currently on gemtesa 75mg  daily which significantly improves to urinary urgency, incontinence and urinary frequency. Toviaz caused drymouth. No UTIs since last visit.    PMH: Past Medical History:  Diagnosis Date   Anxiety and depression    At risk for falls    Coronary artery disease    Diabetes mellitus ([place]) 11/08/2020   Diabetes mellitus without complication (HCC)    GERD (gastroesophageal reflux disease)    History of skin cancer    Right shin   HOH (hard of hearing)    Hyperlipidemia    Hypertension    Hypothyroidism    Stroke [facility]     Surgical History: Past Surgical History:  Procedure Laterality Date   CARDIAC CATHETERIZATION     [phone]   CARDIAC CATHETERIZATION     x 2 stents   CARPAL TUNNEL RELEASE Left    CERVICAL SPINE SURGERY     CHOLECYSTECTOMY  1967   DIAGNOSTIC MAMMOGRAM     dnc     HERNIA REPAIR     HYSTERECTOMY ABDOMINAL WITH SALPINGO-OOPHORECTOMY  1960   MASTECTOMY Right 2012   SKIN CANCER EXCISION     THORACIC SPINE SURGERY      Home Medications:  Allergies as of 04/05/2022       Reactions   Penicillins Diarrhea, Nausea Only   Prednisone Diarrhea, Nausea Only   Sulfa Antibiotics Diarrhea, Nausea Only        Medication List        Accurate as of April 05, 2022  1:04 PM. If you have any questions, ask your nurse or doctor.          amLODipine 10 MG tablet Commonly known as: NORVASC Take 10 mg by mouth in the morning.   B-complex with vitamin C tablet Take 1 tablet by mouth in the morning.   calcitRIOL 0.25 MCG capsule Commonly known as: ROCALTROL Take 0.25 mcg by mouth every Monday, Wednesday, and Friday.   CALCIUM GUMMIES PO Take 2 tablets by mouth in the morning.   Carbidopa-Levodopa ER  25-100 MG tablet controlled release Commonly known as: SINEMET CR Take 1 tablet by mouth at bedtime.   clobetasol 0.05 % external solution Commonly known as: TEMOVATE Apply 1 Application topically in the morning. Applied to the back of the neck   clopidogrel 75 MG tablet Commonly known as: PLAVIX Take 75 mg by mouth in the morning.   cyanocobalamin 500 MCG tablet Commonly known as: VITAMIN B12 Take 1,000 mcg by mouth in the morning.   doxazosin 2 MG tablet Commonly known as: CARDURA Take 2 mg by mouth at bedtime.   escitalopram 10 MG tablet Commonly known as: LEXAPRO Take 10 mg by mouth in the morning.   famotidine 20 MG tablet Commonly known as: Pepcid Take 1 tablet (20 mg total) by mouth at bedtime.   fesoterodine 4 MG Tb24 tablet Commonly known as: TOVIAZ Take 1 tablet (4 mg total) by mouth daily.   folic acid 962 MCG tablet Commonly known as: FOLVITE Take 800 mcg by mouth in the morning.   furosemide 20 MG tablet Commonly known as: LASIX Take 20 mg by mouth in the morning. 20 mg + 40 mg=60 mg What changed: Another medication with the  same name was changed. Make sure you understand how and when to take each.   furosemide 40 MG tablet Commonly known as: LASIX Take 1 tablet (40 mg total) by mouth every morning. For fluid What changed: additional instructions   gabapentin 100 MG capsule Commonly known as: NEURONTIN Take 1 capsule (100 mg total) by mouth 3 (three) times daily. What changed: when to take this   Gemtesa 75 MG Tabs Generic drug: Vibegron Take 75 mg by mouth in the morning.   insulin aspart 100 UNIT/ML FlexPen Commonly known as: NOVOLOG Inject 1-5 Units into the skin every evening. Dose per sliding scale   Lantus SoloStar 100 UNIT/ML Solostar Pen Generic drug: insulin glargine Inject 23 Units into the skin daily.   levothyroxine 50 MCG tablet Commonly known as: SYNTHROID Take 50 mcg by mouth daily before breakfast.   loratadine 10 MG  tablet Commonly known as: CLARITIN Take 10 mg by mouth in the morning.   multivitamin with minerals Tabs tablet Take 1 tablet by mouth in the morning.   omeprazole 40 MG capsule Commonly known as: PRILOSEC Take 40 mg by mouth daily before breakfast.   PATADAY OP Place 1 drop into both eyes in the morning. Once in the mornings in left eye   polyethylene glycol 17 g packet Commonly known as: MIRALAX / GLYCOLAX Take 17 g by mouth every other day.   potassium chloride 10 MEQ tablet Commonly known as: KLOR-CON Take 1 tablet (10 mEq total) by mouth daily. Take While taking Lasix/furosemide   rosuvastatin 40 MG tablet Commonly known as: CRESTOR Take 40 mg by mouth at bedtime.   VITAMIN C GUMMIE PO Take 1,000 mg by mouth in the morning.   Vitamin D3 125 MCG (5000 UT) Tabs Take 5,000 Units by mouth in the morning.        Allergies:  Allergies  Allergen Reactions   Penicillins Diarrhea and Nausea Only   Prednisone Diarrhea and Nausea Only   Sulfa Antibiotics Diarrhea and Nausea Only    Family History: No family history on file.  Social History:  reports that she has never smoked. She has never been exposed to tobacco smoke. She has never used smokeless tobacco. She reports that she does not drink alcohol and does not use drugs.  ROS: All other review of systems were reviewed and are negative except what is noted above in HPI  Physical Exam: BP 126/68   Pulse 73   Constitutional:  Alert and oriented, No acute distress. HEENT: [place] AT, moist mucus membranes.  Trachea midline, no masses. Cardiovascular: No clubbing, cyanosis, or edema. Respiratory: Normal respiratory effort, no increased work of breathing. GI: Abdomen is soft, nontender, nondistended, no abdominal masses GU: No CVA tenderness.  Lymph: No cervical or inguinal lymphadenopathy. Skin: No rashes, bruises or suspicious lesions. Neurologic: Grossly intact, no focal deficits, moving all 4  extremities. Psychiatric: Normal mood and affect.  Laboratory Data: Lab Results  Component Value Date   WBC 12.0 (H) 01/28/2022   HGB 11.1 (L) 01/28/2022   HCT 34.2 (L) 01/28/2022   MCV 86.6 01/28/2022   PLT 222 01/28/2022    Lab Results  Component Value Date   CREATININE 2.18 (H) 01/28/2022    No results found for: "PSA"  No results found for: "TESTOSTERONE"  Lab Results  Component Value Date   HGBA1C 7.0 (H) 11/09/2020    Urinalysis    Component Value Date/Time   COLORURINE YELLOW 01/28/2022 1601   APPEARANCEUR CLOUDY (A) 01/28/2022  [place] 10/08/2021 1729   LABSPEC 1.005 01/28/2022 1601   PHURINE 5.0 01/28/2022 1601   GLUCOSEU NEGATIVE 01/28/2022 1601   HGBUR LARGE (A) 01/28/2022 1601   BILIRUBINUR NEGATIVE 01/28/2022 1601   BILIRUBINUR Negative 10/08/2021 1729   KETONESUR NEGATIVE 01/28/2022 1601   PROTEINUR 100 (A) 01/28/2022 1601   NITRITE NEGATIVE 01/28/2022 1601   LEUKOCYTESUR LARGE (A) 01/28/2022 1601    Lab Results  Component Value Date   LABMICR See below: 10/08/2021   WBCUA 0-5 10/08/2021   LABEPIT 0-10 10/08/2021   MUCUS Present 10/08/2021   BACTERIA MANY (A) 01/28/2022    Pertinent Imaging:  No results found for this or any previous visit.  Results for orders placed during the hospital encounter of 11/08/20  US Venous Img Lower Bilateral (DVT)  Narrative CLINICAL DATA:  Bilateral calf edema and pain  EXAM: BILATERAL LOWER EXTREMITY VENOUS DOPPLER ULTRASOUND  TECHNIQUE: Gray-scale sonography with graded compression, as well as color Doppler and duplex ultrasound were performed to evaluate the lower extremity deep venous systems from the level of the common femoral vein and including the common femoral, femoral, profunda femoral, popliteal and calf veins including the posterior tibial, peroneal and gastrocnemius veins when visible. The superficial great saphenous vein was also interrogated. Spectral Doppler was  utilized to evaluate flow at rest and with distal augmentation maneuvers in the common femoral, femoral and popliteal veins.  COMPARISON:  None.  FINDINGS: RIGHT LOWER EXTREMITY  Common Femoral Vein: No evidence of thrombus. Normal compressibility, respiratory phasicity and response to augmentation.  Saphenofemoral Junction: No evidence of thrombus. Normal compressibility and flow on color Doppler imaging.  Profunda Femoral Vein: No evidence of thrombus. Normal compressibility and flow on color Doppler imaging.  Femoral Vein: No evidence of thrombus. Normal compressibility, respiratory phasicity and response to augmentation.  Popliteal Vein: No evidence of thrombus. Normal compressibility, respiratory phasicity and response to augmentation.  Calf Veins: Limited visualization of the calf veins because of body habitus and peripheral edema. No gross thrombus appreciated.  Superficial Great Saphenous Vein: No evidence of thrombus. Normal compressibility.  LEFT LOWER EXTREMITY  Common Femoral Vein: No evidence of thrombus. Normal compressibility, respiratory phasicity and response to augmentation.  Saphenofemoral Junction: No evidence of thrombus. Normal compressibility and flow on color Doppler imaging.  Profunda Femoral Vein: No evidence of thrombus. Normal compressibility and flow on color Doppler imaging.  Femoral Vein: No evidence of thrombus. Normal compressibility, respiratory phasicity and response to augmentation.  Popliteal Vein: No evidence of thrombus. Normal compressibility, respiratory phasicity and response to augmentation.  Calf Veins: Limited visualization of the calf veins because of body habitus and edema. No gross thrombus appreciated.  Superficial Great Saphenous Vein: No evidence of thrombus. Normal compressibility.  Other Findings: Complex septated left popliteal fossa Baker's cyst measures 3.6 x 2.4 x 3.5 cm  IMPRESSION: Limited exam because  of body habitus and peripheral edema.  No significant acute occlusive DVT in either extremity.  3.6 cm left popliteal fossa complex Baker's cyst.   Electronically Signed By: Jerilynn Mages.  Shick M.D. On: 11/11/2020 07:22  No results found for this or any previous visit.  No results found for this or any previous visit.  Results for orders placed during the hospital encounter of 07/24/21  US RENAL  Narrative CLINICAL DATA:  Acute renal failure with tubular necrosis  Type 2 diabetes  EXAM: RENAL / URINARY TRACT ULTRASOUND COMPLETE  COMPARISON:  07/02/2021  FINDINGS: Right Kidney:  Renal measurements: 10.5 x  4.7 x 5.6 cm = volume: 142 mL. Diffusely thin echogenic cortex. No hydronephrosis. Multiple simple cysts are seen measuring up to 2 cm.  Left Kidney:  Renal measurements: 10.1 x 4.9 x 4.9 cm = volume: 127 mL. Diffusely thinned echogenic cortex. Multiple simple cysts are present measuring up to 1.3 cm.  Bladder:  Appears normal for degree of bladder distention.  Other:  None.  IMPRESSION: Thinned echogenic renal cortices consistent with chronic medical renal disease.   Electronically Signed By: Miachel Roux M.D. On: 07/25/2021 14:21  No valid procedures specified. No results found for this or any previous visit.  No results found for this or any previous visit.   Assessment & Plan:    1. Urge incontinence -continue gemtesa 75mg  daily - Urinalysis, Routine w reflex microscopic - BLADDER SCAN AMB NON-IMAGING  2. History of UTI -followup 6 months - Urinalysis, Routine w reflex microscopic - BLADDER SCAN AMB NON-IMAGING   No follow-ups on file.  Nicolette Bang, MD  [facility] [place]

## 2022-04-05 NOTE — Patient Instructions (Signed)

## 2022-04-08 ENCOUNTER — Ambulatory Visit: Payer: Medicare HMO | Admitting: Neurology

## 2022-04-09 ENCOUNTER — Encounter (HOSPITAL_COMMUNITY): Payer: Self-pay | Admitting: Gastroenterology

## 2022-04-09 ENCOUNTER — Ambulatory Visit (HOSPITAL_BASED_OUTPATIENT_CLINIC_OR_DEPARTMENT_OTHER): Payer: Medicare Other | Admitting: Anesthesiology

## 2022-04-09 ENCOUNTER — Ambulatory Visit (HOSPITAL_COMMUNITY)
Admission: RE | Admit: 2022-04-09 | Discharge: 2022-04-09 | Disposition: A | Discharge status: Home / Self Care | Payer: Medicare Other | Attending: Gastroenterology | Admitting: Gastroenterology

## 2022-04-09 ENCOUNTER — Ambulatory Visit (HOSPITAL_COMMUNITY): Payer: Medicare Other | Admitting: Anesthesiology

## 2022-04-09 ENCOUNTER — Encounter (HOSPITAL_COMMUNITY)
Admission: RE | Disposition: A | Discharge status: Home / Self Care | Payer: Self-pay | Source: Home / Self Care | Attending: Gastroenterology

## 2022-04-09 DIAGNOSIS — E119 Type 2 diabetes mellitus without complications: Secondary | ICD-10-CM | POA: Insufficient documentation

## 2022-04-09 DIAGNOSIS — R1310 Dysphagia, unspecified: Secondary | ICD-10-CM | POA: Diagnosis not present

## 2022-04-09 DIAGNOSIS — F32A Depression, unspecified: Secondary | ICD-10-CM | POA: Diagnosis not present

## 2022-04-09 DIAGNOSIS — I5033 Acute on chronic diastolic (congestive) heart failure: Secondary | ICD-10-CM

## 2022-04-09 DIAGNOSIS — Z79899 Other long term (current) drug therapy: Secondary | ICD-10-CM | POA: Diagnosis not present

## 2022-04-09 DIAGNOSIS — Z955 Presence of coronary angioplasty implant and graft: Secondary | ICD-10-CM | POA: Diagnosis not present

## 2022-04-09 DIAGNOSIS — I110 Hypertensive heart disease with heart failure: Secondary | ICD-10-CM

## 2022-04-09 DIAGNOSIS — K219 Gastro-esophageal reflux disease without esophagitis: Secondary | ICD-10-CM | POA: Insufficient documentation

## 2022-04-09 DIAGNOSIS — I2510 Atherosclerotic heart disease of native coronary artery without angina pectoris: Secondary | ICD-10-CM

## 2022-04-09 DIAGNOSIS — Z9889 Other specified postprocedural states: Secondary | ICD-10-CM | POA: Insufficient documentation

## 2022-04-09 DIAGNOSIS — I10 Essential (primary) hypertension: Secondary | ICD-10-CM

## 2022-04-09 DIAGNOSIS — E039 Hypothyroidism, unspecified: Secondary | ICD-10-CM | POA: Diagnosis not present

## 2022-04-09 DIAGNOSIS — E785 Hyperlipidemia, unspecified: Secondary | ICD-10-CM | POA: Insufficient documentation

## 2022-04-09 DIAGNOSIS — I509 Heart failure, unspecified: Secondary | ICD-10-CM | POA: Insufficient documentation

## 2022-04-09 DIAGNOSIS — F419 Anxiety disorder, unspecified: Secondary | ICD-10-CM | POA: Diagnosis not present

## 2022-04-09 HISTORY — PX: ESOPHAGEAL DILATION: SHX303

## 2022-04-09 HISTORY — PX: ESOPHAGOGASTRODUODENOSCOPY (EGD) WITH PROPOFOL: SHX5813

## 2022-04-09 LAB — GLUCOSE, CAPILLARY: Glucose-Capillary: 141 mg/dL — ABNORMAL HIGH (ref 70–99)

## 2022-04-09 SURGERY — ESOPHAGOGASTRODUODENOSCOPY (EGD) WITH PROPOFOL
Anesthesia: General

## 2022-04-09 MED ORDER — PROPOFOL 10 MG/ML IV BOLUS
INTRAVENOUS | Status: DC | PRN
  Administered 2022-04-09: 30 mg via INTRAVENOUS
  Administered 2022-04-09: 60 mg via INTRAVENOUS

## 2022-04-09 MED ORDER — LACTATED RINGERS IV SOLN: INTRAVENOUS | Status: DC

## 2022-04-09 MED ORDER — LIDOCAINE HCL (CARDIAC) PF 100 MG/5ML IV SOSY
PREFILLED_SYRINGE | INTRAVENOUS | Status: DC | PRN
  Administered 2022-04-09: 50 mg via INTRAVENOUS

## 2022-04-09 MED ORDER — LIDOCAINE VISCOUS HCL 2 % MT SOLN
15.0000 mL | Freq: Once | OROMUCOSAL | Status: AC
  Administered 2022-04-09: 15 mL via OROMUCOSAL

## 2022-04-09 MED ORDER — LIDOCAINE VISCOUS HCL 2 % MT SOLN
OROMUCOSAL | Status: AC
  Filled 2022-04-09: qty 15

## 2022-04-09 NOTE — Anesthesia Postprocedure Evaluation (Signed)
Anesthesia Post Note  Patient: Dyamon Sosinski  Procedure(s) Performed: ESOPHAGOGASTRODUODENOSCOPY (EGD) WITH PROPOFOL ESOPHAGEAL DILATION  Patient location during evaluation: Phase II Anesthesia Type: General Level of consciousness: awake and alert and oriented Pain management: pain level controlled Vital Signs Assessment: post-procedure vital signs reviewed and stable Respiratory status: spontaneous breathing, nonlabored ventilation and respiratory function stable Cardiovascular status: blood pressure returned to baseline and stable Postop Assessment: no apparent nausea or vomiting Anesthetic complications: no  No notable events documented.   Last Vitals:  Vitals:   04/09/22 1254 04/09/22 1310  BP:  (!) 149/59  Pulse: (!) 53 62  Resp: 16 18  Temp:    SpO2: 98% 98%    Last Pain:  Vitals:   04/09/22 1254  TempSrc:   PainSc: 0-No pain                 Aleea Hendry C Margree Gimbel

## 2022-04-09 NOTE — Discharge Instructions (Addendum)
You are being discharged to home.  Resume your previous diet.  If persistent dysphagia, proceed with MBS. Restart Plavix today.

## 2022-04-09 NOTE — H&P (Signed)
Amy Kelly is an 89 y.o. female.   Chief Complaint: dysphagia HPI: Amy Kelly is a 89 y.o. female with past medical history of anxiety, depression, CAD, DM, GERD, HLD, HTN, hypothyroidism, coming for evaluation of dysphagia.  Patient reports that she has presented dysphagia to solids intermittently.  No melena hematochezia, nausea, vomiting, fever or chills.  No heartburn.  Currently on Prilosec in the morning and Pepcid as needed at night.  Past Medical History:  Diagnosis Date   Anxiety and depression    At risk for falls    Coronary artery disease    Diabetes mellitus ([place]) 11/08/2020   Diabetes mellitus without complication (HCC)    GERD (gastroesophageal reflux disease)    History of skin cancer    Right shin   HOH (hard of hearing)    Hyperlipidemia    Hypertension    Hypothyroidism    Stroke [facility])     Past Surgical History:  Procedure Laterality Date   CARDIAC CATHETERIZATION     [phone]   CARDIAC CATHETERIZATION     x 2 stents   CARPAL TUNNEL RELEASE Left    CERVICAL SPINE SURGERY     CHOLECYSTECTOMY  1967   DIAGNOSTIC MAMMOGRAM     dnc     HERNIA REPAIR     HYSTERECTOMY ABDOMINAL WITH SALPINGO-OOPHORECTOMY  1960   MASTECTOMY Right 2012   SKIN CANCER EXCISION     THORACIC SPINE SURGERY      History reviewed. No pertinent family history. Social History:  reports that she has never smoked. She has never been exposed to tobacco smoke. She has never used smokeless tobacco. She reports that she does not drink alcohol and does not use drugs.  Allergies:  Allergies  Allergen Reactions   Penicillins Diarrhea and Nausea Only   Prednisone Diarrhea and Nausea Only   Sulfa Antibiotics Diarrhea and Nausea Only    Medications Prior to Admission  Medication Sig Dispense Refill   amLODipine (NORVASC) 10 MG tablet Take 10 mg by mouth in the morning.     Ascorbic Acid (VITAMIN C GUMMIE PO) Take 1,000 mg by mouth in the morning.     B Complex-C (B-COMPLEX WITH  VITAMIN C) tablet Take 1 tablet by mouth in the morning.     calcitRIOL (ROCALTROL) 0.25 MCG capsule Take 0.25 mcg by mouth every Monday, Wednesday, and Friday.     Calcium-Phosphorus-Vitamin D (CALCIUM GUMMIES PO) Take 2 tablets by mouth in the morning.     Carbidopa-Levodopa ER (SINEMET CR) 25-100 MG tablet controlled release Take 1 tablet by mouth at bedtime.     Cholecalciferol (VITAMIN D3) 125 MCG (5000 UT) TABS Take 5,000 Units by mouth in the morning.     clobetasol (TEMOVATE) 0.05 % external solution Apply 1 Application topically in the morning. Applied to the back of the neck     clopidogrel (PLAVIX) 75 MG tablet Take 75 mg by mouth in the morning.     doxazosin (CARDURA) 2 MG tablet Take 2 mg by mouth at bedtime.     escitalopram (LEXAPRO) 10 MG tablet Take 10 mg by mouth in the morning.     famotidine (PEPCID) 20 MG tablet Take 1 tablet (20 mg total) by mouth at bedtime. 60 tablet 2   folic acid (FOLVITE) 696 MCG tablet Take 800 mcg by mouth in the morning.     furosemide (LASIX) 20 MG tablet Take 20 mg by mouth in the morning. 20 mg + 40 mg=60 mg  furosemide (LASIX) 40 MG tablet Take 1 tablet (40 mg total) by mouth every morning. For fluid (Patient taking differently: Take 40 mg by mouth every morning. 40 mg + 20 mg= 60 mg) 30 tablet 5   gabapentin (NEURONTIN) 100 MG capsule Take 1 capsule (100 mg total) by mouth 3 (three) times daily. (Patient taking differently: Take 100 mg by mouth in the morning and at bedtime.) 90 capsule 5   insulin aspart (NOVOLOG) 100 UNIT/ML FlexPen Inject 1-5 Units into the skin every evening. Dose per sliding scale     insulin glargine (LANTUS SOLOSTAR) 100 UNIT/ML Solostar Pen Inject 23 Units into the skin daily.     levothyroxine (SYNTHROID) 50 MCG tablet Take 50 mcg by mouth daily before breakfast.     loratadine (CLARITIN) 10 MG tablet Take 10 mg by mouth in the morning.     Multiple Vitamin (MULTIVITAMIN WITH MINERALS) TABS tablet Take 1 tablet by  mouth in the morning.     Olopatadine HCl (PATADAY OP) Place 1 drop into both eyes in the morning. Once in the mornings in left eye     omeprazole (PRILOSEC) 40 MG capsule Take 40 mg by mouth daily before breakfast.     polyethylene glycol (MIRALAX / GLYCOLAX) 17 g packet Take 17 g by mouth every other day.     potassium chloride (KLOR-CON) 10 MEQ tablet Take 1 tablet (10 mEq total) by mouth daily. Take While taking Lasix/furosemide 30 tablet 2   rosuvastatin (CRESTOR) 40 MG tablet Take 40 mg by mouth at bedtime.     Vibegron (GEMTESA) 75 MG TABS Take 75 mg by mouth in the morning.     vitamin B-12 (CYANOCOBALAMIN) 500 MCG tablet Take 1,000 mcg by mouth in the morning.     fesoterodine (TOVIAZ) 4 MG TB24 tablet Take 1 tablet (4 mg total) by mouth daily. (Patient not taking: Reported on 04/01/2022) 30 tablet 11    Results for orders placed or performed during the hospital encounter of 04/09/22 (from the past 48 hour(s))  Glucose, capillary     Status: Abnormal   Collection Time: 04/09/22 11:29 AM  Result Value Ref Range   Glucose-Capillary 141 (H) 70 - 99 mg/dL    Comment: Glucose reference range applies only to samples taken after fasting for at least 8 hours.   No results found.  Review of Systems  HENT:  Positive for trouble swallowing.   All other systems reviewed and are negative.   Blood pressure (!) 141/66, pulse 60, temperature 98.6 F (37 C), temperature source Oral, resp. rate 18, height 5\' 3"  (1.6 m), weight 86.6 kg, SpO2 93 %. Physical Exam  GENERAL: The patient is AO x3, in no acute distress. HEENT: Head is normocephalic and atraumatic. EOMI are intact. Mouth is well hydrated and without lesions. NECK: Supple. No masses LUNGS: Clear to auscultation. No presence of rhonchi/wheezing/rales. Adequate chest expansion HEART: RRR, normal s1 and s2. ABDOMEN: Soft, nontender, no guarding, no peritoneal signs, and nondistended. BS +. No masses. EXTREMITIES: Without any cyanosis,  clubbing, rash, lesions or edema. NEUROLOGIC: AOx3, no focal motor deficit. SKIN: no jaundice, no rashes  Assessment/Plan  Amy Kelly is a 89 y.o. female with past medical history of anxiety, depression, CAD, DM, GERD, HLD, HTN, hypothyroidism, coming for evaluation of dysphagia.  Will proceed with EGD. Harvel Quale, MD 04/09/2022, 11:49 AM

## 2022-04-09 NOTE — Transfer of Care (Signed)
Immediate Anesthesia Transfer of Care Note  Patient: Amy Kelly  Procedure(s) Performed: ESOPHAGOGASTRODUODENOSCOPY (EGD) WITH PROPOFOL ESOPHAGEAL DILATION  Patient Location: Short Stay  Anesthesia Type:General  Level of Consciousness: awake, alert , oriented, and patient cooperative  Airway & Oxygen Therapy: Patient Spontanous Breathing  Post-op Assessment: Report given to RN, Post -op Vital signs reviewed and stable, and Patient moving all extremities  Post vital signs: Reviewed and stable  Last Vitals:  Vitals Value Taken Time  BP 149/59 04/09/22 1310  Temp 36.9 C 04/09/22 1249  Pulse 62 04/09/22 1310  Resp 18 04/09/22 1310  SpO2 98 % 04/09/22 1310    Last Pain:  Vitals:   04/09/22 1254  TempSrc:   PainSc: 0-No pain         Complications: No notable events documented.

## 2022-04-09 NOTE — Anesthesia Preprocedure Evaluation (Signed)
Anesthesia Evaluation  Patient identified by MRN, date of birth, ID band Patient awake    Reviewed: Allergy & Precautions, H&P , NPO status , Patient's Chart, lab work & pertinent test results  History of Anesthesia Complications Negative for: history of anesthetic complications  Airway Mallampati: III  TM Distance: >3 FB Neck ROM: Full   Comment: Cervical spine sx Dental no notable dental hx. (+) Dental Advisory Given, Missing, Poor Dentition, Chipped   Pulmonary neg pulmonary ROS   Pulmonary exam normal breath sounds clear to auscultation       Cardiovascular METS (left sided weakness, uses walker): hypertension, Pt. on medications + CAD, + Cardiac Stents and +CHF   Rhythm:Irregular Rate:Normal + Systolic murmurs 1. Left ventricular ejection fraction, by estimation, is 55 to 60%. The  left ventricle has normal function. The left ventricle has no regional  wall motion abnormalities. Left ventricular diastolic parameters are  indeterminate.   2. Right ventricular systolic function is normal. The right ventricular  size is normal. There is moderately elevated pulmonary artery systolic  pressure.   3. Left atrial size was moderately dilated.   4. Right atrial size was mildly dilated.   5. The mitral valve is normal in structure. Trivial mitral valve  regurgitation. No evidence of mitral stenosis.   6. The tricuspid valve is abnormal.   7. The aortic valve was not well visualized. There is moderate  calcification of the aortic valve. There is moderate thickening of the  aortic valve. Aortic valve regurgitation is not visualized. No aortic  stenosis is present.   8. The inferior vena cava is dilated in size with >50% respiratory  variability, suggesting right atrial pressure of 8 mmHg.     Neuro/Psych  PSYCHIATRIC DISORDERS Anxiety Depression    CVA    GI/Hepatic Neg liver ROS,GERD  Medicated and Controlled,,  Endo/Other   diabetes, Well Controlled, Type 2Hypothyroidism    Renal/GU Renal InsufficiencyRenal disease  negative genitourinary   Musculoskeletal negative musculoskeletal ROS (+)    Abdominal   Peds negative pediatric ROS (+)  Hematology  (+) Blood dyscrasia, anemia   Anesthesia Other Findings   Reproductive/Obstetrics negative OB ROS                             Anesthesia Physical Anesthesia Plan  ASA: 3  Anesthesia Plan: General   Post-op Pain Management: Minimal or no pain anticipated   Induction: Intravenous  PONV Risk Score and Plan: 0 and Propofol infusion  Airway Management Planned: Nasal Cannula and Natural Airway  Additional Equipment:   Intra-op Plan:   Post-operative Plan:   Informed Consent: I have reviewed the patients History and Physical, chart, labs and discussed the procedure including the risks, benefits and alternatives for the proposed anesthesia with the patient or authorized representative who has indicated his/her understanding and acceptance.     Dental advisory given  Plan Discussed with: CRNA and Surgeon  Anesthesia Plan Comments:         Anesthesia Quick Evaluation

## 2022-04-09 NOTE — Op Note (Signed)
[facility] Patient Name: Amy Kelly Procedure Date: 04/09/2022 12:24 PM MRN: [id number] Date of Birth: Dec 06, 1928 Attending MD: Maylon Peppers , , [id number] CSN: [id number] Age: 89 Admit Type: Outpatient Procedure:                Upper GI endoscopy Indications:              Dysphagia Providers:                Maylon Peppers, Lambert Mody Aram Candela Referring MD:              Medicines:                Monitored Anesthesia Care Complications:            No immediate complications. Estimated Blood Loss:     Estimated blood loss: none. Procedure:                Pre-Anesthesia Assessment:                           - Prior to the procedure, a History and Physical                            was performed, and patient medications, allergies                            and sensitivities were reviewed. The patient's                            tolerance of previous anesthesia was reviewed.                           - The risks and benefits of the procedure and the                            sedation options and risks were discussed with the                            patient. All questions were answered and informed                            consent was obtained.                           - ASA Grade Assessment: III - A patient with severe                            systemic disease.                           After obtaining informed consent, the endoscope was                            passed under direct vision. Throughout the                            procedure, the patient's blood pressure, pulse, and  oxygen saturations were monitored continuously. The                            [id number]-[id number] ([id number]) scope was introduced through the                            mouth, and advanced to the second part of duodenum.                            The upper GI endoscopy was accomplished without                            difficulty. The patient tolerated  the procedure                            well. Scope In: 12:41:02 PM Scope Out: 24:23:53 PM Total Procedure Duration: 0 hours 5 minutes 32 seconds  Findings:      No endoscopic abnormality was evident in the esophagus to explain the       patient's complaint of dysphagia. It was decided, however, to proceed       with dilation of the entire esophagus. A guidewire was placed and the       scope was withdrawn. Dilation was performed with a Savary dilator with       no resistance at 18 mm. The dilation site was examined following       endoscope reinsertion and showed no change.      Prior Nissen fundoplication was found at the gastroesophageal junction.       The wrap was slightly loose.      The stomach was normal.      The examined duodenum was normal. Impression:               - No endoscopic esophageal abnormality to explain                            patient's dysphagia. Esophagus dilated. Dilated.                           - Nissen fundoplication was found.                           - Normal stomach.                           - Normal examined duodenum.                           - No specimens collected. Moderate Sedation:      Per Anesthesia Care Recommendation:           - Discharge patient to home (ambulatory).                           - Resume previous diet.                           - If persistent dysphagia, proceed with MBS.                           -  Restart Plavix today. Procedure Code(s):        --- Professional ---                           [phone], Esophagogastroduodenoscopy, flexible,                            transoral; with insertion of guide wire followed by                            passage of dilator(s) through esophagus over guide                            wire Diagnosis Code(s):        --- Professional ---                           R13.10, Dysphagia, unspecified                           Z98.890, Other specified postprocedural states CPT copyright 2022  American Medical Association. All rights reserved. The codes documented in this report are preliminary and upon coder review may  be revised to meet current compliance requirements. Maylon Peppers, MD Maylon Peppers,  04/09/2022 12:50:37 PM This report has been signed electronically. Number of Addenda: 0

## 2022-04-15 ENCOUNTER — Encounter (HOSPITAL_COMMUNITY): Payer: Self-pay | Admitting: Gastroenterology

## 2022-04-24 DIAGNOSIS — E1122 Type 2 diabetes mellitus with diabetic chronic kidney disease: Secondary | ICD-10-CM | POA: Diagnosis not present

## 2022-04-24 DIAGNOSIS — R32 Unspecified urinary incontinence: Secondary | ICD-10-CM | POA: Diagnosis not present

## 2022-04-24 DIAGNOSIS — N189 Chronic kidney disease, unspecified: Secondary | ICD-10-CM | POA: Diagnosis not present

## 2022-04-24 DIAGNOSIS — I129 Hypertensive chronic kidney disease with stage 1 through stage 4 chronic kidney disease, or unspecified chronic kidney disease: Secondary | ICD-10-CM | POA: Diagnosis not present

## 2022-04-26 DIAGNOSIS — D638 Anemia in other chronic diseases classified elsewhere: Secondary | ICD-10-CM | POA: Diagnosis not present

## 2022-04-26 DIAGNOSIS — N189 Chronic kidney disease, unspecified: Secondary | ICD-10-CM | POA: Diagnosis not present

## 2022-04-26 DIAGNOSIS — R809 Proteinuria, unspecified: Secondary | ICD-10-CM | POA: Diagnosis not present

## 2022-04-26 DIAGNOSIS — E211 Secondary hyperparathyroidism, not elsewhere classified: Secondary | ICD-10-CM | POA: Diagnosis not present

## 2022-04-30 DIAGNOSIS — N184 Chronic kidney disease, stage 4 (severe): Secondary | ICD-10-CM | POA: Diagnosis not present

## 2022-04-30 DIAGNOSIS — E1122 Type 2 diabetes mellitus with diabetic chronic kidney disease: Secondary | ICD-10-CM | POA: Diagnosis not present

## 2022-04-30 DIAGNOSIS — N189 Chronic kidney disease, unspecified: Secondary | ICD-10-CM | POA: Diagnosis not present

## 2022-05-16 DIAGNOSIS — N184 Chronic kidney disease, stage 4 (severe): Secondary | ICD-10-CM | POA: Diagnosis not present

## 2022-05-21 DIAGNOSIS — N189 Chronic kidney disease, unspecified: Secondary | ICD-10-CM | POA: Diagnosis not present

## 2022-05-21 DIAGNOSIS — R809 Proteinuria, unspecified: Secondary | ICD-10-CM | POA: Diagnosis not present

## 2022-05-21 DIAGNOSIS — E1122 Type 2 diabetes mellitus with diabetic chronic kidney disease: Secondary | ICD-10-CM | POA: Diagnosis not present

## 2022-05-21 DIAGNOSIS — N390 Urinary tract infection, site not specified: Secondary | ICD-10-CM | POA: Diagnosis not present

## 2022-05-27 ENCOUNTER — Encounter (INDEPENDENT_AMBULATORY_CARE_PROVIDER_SITE_OTHER): Payer: Self-pay | Admitting: Gastroenterology

## 2022-05-27 ENCOUNTER — Ambulatory Visit (INDEPENDENT_AMBULATORY_CARE_PROVIDER_SITE_OTHER): Payer: Medicare Other | Admitting: Gastroenterology

## 2022-05-27 VITALS — BP 135/60 | HR 60 | Temp 98.0°F | Ht 63.0 in | Wt 199.3 lb

## 2022-05-27 DIAGNOSIS — R059 Cough, unspecified: Secondary | ICD-10-CM

## 2022-05-27 DIAGNOSIS — R1312 Dysphagia, oropharyngeal phase: Secondary | ICD-10-CM | POA: Diagnosis not present

## 2022-05-27 DIAGNOSIS — K5904 Chronic idiopathic constipation: Secondary | ICD-10-CM

## 2022-05-27 NOTE — Patient Instructions (Addendum)
Schedule modified barium swallow with speech swallow therapist Start taking Miralax 1 capful every day

## 2022-05-27 NOTE — Progress Notes (Signed)
Maylon Peppers, M.D. Gastroenterology & Hepatology [facility] [address] [place], [place] [address]  Primary Care Physician: Sherrilee Gilles, [place] [address]  I will communicate my assessment and recommendations to the referring MD via EMR.  Problems: Choking  GERD status post Nissen fundoplication  History of Present Illness: Quanisha Piasecki is a 89 y.o. female with PMH anxiety, depression, CAD, DM, GERD status post Niesen fundoplication, HLD, HTN, h/o stroke, hypothyroidism, who presents for follow up of choking.  The patient was last seen on 02/21/2022. At that time, was advised to take omeprazole 40 mg in the morning, Pepcid 20 mg at night and to start MiraLAX for constipation the patient was scheduled for an EGD, which she underwent on 04/09/2022.  There was no presence of abnormalities in her esophagus, this was empirically dilated with savory up to 18 mm.  There was presence of a Nissen fundoplication with loose wrap.  Normal stomach and duodenum.  Patient reports that she is having issues when swallowing solids or liquids despite undergoing dilation.  She is having these choking spells frequently. She is concerned as she is not able to swallow some of her pills sometimes. She has to cough sometimes due to the symptom severity.  Takes Miralax intermittently, so some days she has constipation when she does not take the medication.  However, this eventually improves after taking 1 capful of MiraLAX next day.  The patient denies having any nausea, vomiting, fever, chills, hematochezia, melena, hematemesis, abdominal distention, abdominal pain, diarrhea, jaundice, pruritus or weight loss.  Last EGD: As above  Past Medical History: Past Medical History:  Diagnosis Date   Anxiety and depression    At risk for falls    Coronary artery disease    Diabetes mellitus ([place]) 11/08/2020   Diabetes mellitus without complication  (HCC)    GERD (gastroesophageal reflux disease)    History of skin cancer    Right shin   HOH (hard of hearing)    Hyperlipidemia    Hypertension    Hypothyroidism    Stroke [facility])     Past Surgical History: Past Surgical History:  Procedure Laterality Date   CARDIAC CATHETERIZATION     [phone]   CARDIAC CATHETERIZATION     x 2 stents   CARPAL TUNNEL RELEASE Left    CERVICAL SPINE SURGERY     CHOLECYSTECTOMY  1967   DIAGNOSTIC MAMMOGRAM     dnc     ESOPHAGEAL DILATION N/A 04/09/2022   Procedure: ESOPHAGEAL DILATION;  Surgeon: Harvel Quale, MD;  [facility];  Service: Gastroenterology;  Laterality: N/A;   ESOPHAGOGASTRODUODENOSCOPY (EGD) WITH PROPOFOL N/A 04/09/2022   Procedure: ESOPHAGOGASTRODUODENOSCOPY (EGD) WITH PROPOFOL;  Surgeon: Harvel Quale, MD;  [facility];  Service: Gastroenterology;  Laterality: N/A;  2:30pm, asa 3   HERNIA REPAIR     HYSTERECTOMY ABDOMINAL WITH SALPINGO-OOPHORECTOMY  1960   MASTECTOMY Right 2012   SKIN CANCER EXCISION     THORACIC SPINE SURGERY      Family History:No family history on file.  Social History: Social History   Tobacco Use  Smoking Status Never   Passive exposure: Never  Smokeless Tobacco Never   Social History   Substance and Sexual Activity  Alcohol Use Never   Social History   Substance and Sexual Activity  Drug Use Never    Allergies: Allergies  Allergen Reactions   Fesoterodine     Severe dry mouth  Penicillins Diarrhea and Nausea Only   Prednisone Diarrhea and Nausea Only   Solifenacin     Severe dry mouth   Sulfa Antibiotics Diarrhea and Nausea Only    Medications: Current Outpatient Medications  Medication Sig Dispense Refill   amLODipine (NORVASC) 10 MG tablet Take 10 mg by mouth in the morning.     Ascorbic Acid (VITAMIN C GUMMIE PO) Take 1,000 mg by mouth in the morning.     B Complex-C (B-COMPLEX WITH VITAMIN C) tablet Take 1 tablet by  mouth in the morning.     calcitRIOL (ROCALTROL) 0.25 MCG capsule Take 0.25 mcg by mouth every Monday, Wednesday, and Friday.     Calcium-Phosphorus-Vitamin D (CALCIUM GUMMIES PO) Take 2 tablets by mouth in the morning.     Carbidopa-Levodopa ER (SINEMET CR) 25-100 MG tablet controlled release Take 1 tablet by mouth at bedtime.     Cholecalciferol (VITAMIN D3) 125 MCG (5000 UT) TABS Take 5,000 Units by mouth in the morning.     clobetasol (TEMOVATE) 0.05 % external solution Apply 1 Application topically in the morning. Applied to the back of the neck     clopidogrel (PLAVIX) 75 MG tablet Take 75 mg by mouth in the morning.     doxazosin (CARDURA) 2 MG tablet Take 2 mg by mouth at bedtime.     escitalopram (LEXAPRO) 10 MG tablet Take 10 mg by mouth in the morning.     famotidine (PEPCID) 20 MG tablet Take 1 tablet (20 mg total) by mouth at bedtime. 60 tablet 2   fesoterodine (TOVIAZ) 4 MG TB24 tablet Take 1 tablet (4 mg total) by mouth daily. 30 tablet 11   folic acid (FOLVITE) [id number] MCG tablet Take 800 mcg by mouth in the morning.     furosemide (LASIX) 20 MG tablet Take 20 mg by mouth in the morning. 20 mg + 40 mg=60 mg     furosemide (LASIX) 40 MG tablet Take 1 tablet (40 mg total) by mouth every morning. For fluid (Patient taking differently: Take 40 mg by mouth every morning. 40 mg + 20 mg= 60 mg) 30 tablet 5   gabapentin (NEURONTIN) 100 MG capsule Take 1 capsule (100 mg total) by mouth 3 (three) times daily. (Patient taking differently: Take 100 mg by mouth in the morning and at bedtime.) 90 capsule 5   insulin aspart (NOVOLOG) 100 UNIT/ML FlexPen Inject 1-5 Units into the skin every evening. Dose per sliding scale     insulin glargine (LANTUS SOLOSTAR) 100 UNIT/ML Solostar Pen Inject 23 Units into the skin daily.     levothyroxine (SYNTHROID) 50 MCG tablet Take 50 mcg by mouth daily before breakfast.     loratadine (CLARITIN) 10 MG tablet Take 10 mg by mouth in the morning.     Multiple  Vitamin (MULTIVITAMIN WITH MINERALS) TABS tablet Take 1 tablet by mouth in the morning.     omeprazole (PRILOSEC) 40 MG capsule Take 40 mg by mouth daily before breakfast.     polyethylene glycol (MIRALAX / GLYCOLAX) 17 g packet Take 17 g by mouth every other day.     potassium chloride (KLOR-CON) 10 MEQ tablet Take 1 tablet (10 mEq total) by mouth daily. Take While taking Lasix/furosemide 30 tablet 2   rosuvastatin (CRESTOR) 40 MG tablet Take 40 mg by mouth at bedtime.     Vibegron (GEMTESA) 75 MG TABS Take 75 mg by mouth in the morning.     vitamin B-12 (CYANOCOBALAMIN) 500 MCG tablet  Take 1,000 mcg by mouth in the morning.     Olopatadine HCl (PATADAY OP) Place 1 drop into both eyes in the morning. Once in the mornings in left eye     No current facility-administered medications for this visit.    Review of Systems: GENERAL: negative for malaise, night sweats HEENT: No changes in hearing or vision, no nose bleeds or other nasal problems. NECK: Negative for lumps, goiter, pain and significant neck swelling RESPIRATORY: Negative for cough, wheezing CARDIOVASCULAR: Negative for chest pain, leg swelling, palpitations, orthopnea GI: SEE HPI MUSCULOSKELETAL: Negative for joint pain or swelling, back pain, and muscle pain. SKIN: Negative for lesions, rash PSYCH: Negative for sleep disturbance, mood disorder and recent psychosocial stressors. HEMATOLOGY Negative for prolonged bleeding, bruising easily, and swollen nodes. ENDOCRINE: Negative for cold or heat intolerance, polyuria, polydipsia and goiter. NEURO: negative for tremor, gait imbalance, syncope and seizures. The remainder of the review of systems is noncontributory.   Physical Exam: BP (!) 97/59 (BP Location: Left Arm, Patient Position: Sitting, Cuff Size: Large)   Pulse 77   Temp 98 F (36.7 C) (Oral)   Ht '5\' 3"'$  (1.6 m)   Wt 199 lb 4.8 oz (90.4 kg)   BMI 35.30 kg/m  GENERAL: The patient is AO x3, in no acute  distress. HEENT: Head is normocephalic and atraumatic. EOMI are intact. Mouth is well hydrated and without lesions. NECK: Supple. No masses LUNGS: Clear to auscultation. No presence of rhonchi/wheezing/rales. Adequate chest expansion HEART: RRR, normal s1 and s2. ABDOMEN: Soft, nontender, no guarding, no peritoneal signs, and nondistended. BS +. No masses. EXTREMITIES: Without any cyanosis, clubbing, rash, lesions or edema. NEUROLOGIC: AOx3, no focal motor deficit. SKIN: no jaundice, no rashes  Imaging/Labs: as above  I personally reviewed and interpreted the available labs, imaging and endoscopic files.  Impression and Plan: Angee Fitting is a 89 y.o. female with PMH anxiety, depression, CAD, DM, GERD status post Niesen fundoplication, HLD, HTN, h/o stroke, hypothyroidism, who presents for follow up of choking.  Patient has persisted with choking episodes despite undergoing empiric dilation during most recent EGD, which did not show any obstructive lesions in her esophagus.  Patient had a cup with water during today's visit.  I asked her to swallow a sip of water.  She proceeded to do this and had difficulty initiating the swallow which was followed by a " gulp effort".  Denied having any retrosternal discomfort when this was performed.  I suspect that given her history of stroke and difficulty initiating the swallow process, this is likely oropharyngeal dysphagia in nature.  She would benefit from an evaluation with a modified barium swallow and speech and swallow therapy.  Patient and family member understood and agreed.  Finally, she has presented problems with constipation that have improved with the use of MiraLAX.  I emphasized the importance of taking this medication on a regular basis to achieve better symptom control.  -Schedule modified barium swallow with speech swallow therapist -Start taking Miralax 1 capful every day  All questions were answered.      Maylon Peppers,  MD Gastroenterology and Hepatology [facility]

## 2022-05-28 ENCOUNTER — Other Ambulatory Visit (HOSPITAL_COMMUNITY): Payer: Self-pay | Admitting: Occupational Therapy

## 2022-05-28 DIAGNOSIS — K219 Gastro-esophageal reflux disease without esophagitis: Secondary | ICD-10-CM

## 2022-05-28 DIAGNOSIS — R1312 Dysphagia, oropharyngeal phase: Secondary | ICD-10-CM

## 2022-06-13 ENCOUNTER — Ambulatory Visit (HOSPITAL_COMMUNITY): Payer: Medicare Other | Attending: Gastroenterology | Admitting: Speech Pathology

## 2022-06-13 ENCOUNTER — Other Ambulatory Visit: Payer: Self-pay | Admitting: Urology

## 2022-06-13 ENCOUNTER — Ambulatory Visit (HOSPITAL_COMMUNITY)
Admission: RE | Admit: 2022-06-13 | Discharge: 2022-06-13 | Disposition: A | Discharge status: Home / Self Care | Payer: Medicare Other | Source: Ambulatory Visit | Attending: Gastroenterology | Admitting: Gastroenterology

## 2022-06-13 DIAGNOSIS — K219 Gastro-esophageal reflux disease without esophagitis: Secondary | ICD-10-CM

## 2022-06-13 DIAGNOSIS — T17320A Food in larynx causing asphyxiation, initial encounter: Secondary | ICD-10-CM | POA: Diagnosis not present

## 2022-06-13 DIAGNOSIS — R1310 Dysphagia, unspecified: Secondary | ICD-10-CM | POA: Diagnosis not present

## 2022-06-13 DIAGNOSIS — N189 Chronic kidney disease, unspecified: Secondary | ICD-10-CM | POA: Diagnosis not present

## 2022-06-13 DIAGNOSIS — E1129 Type 2 diabetes mellitus with other diabetic kidney complication: Secondary | ICD-10-CM | POA: Diagnosis not present

## 2022-06-13 DIAGNOSIS — R1312 Dysphagia, oropharyngeal phase: Secondary | ICD-10-CM | POA: Insufficient documentation

## 2022-06-13 DIAGNOSIS — E1122 Type 2 diabetes mellitus with diabetic chronic kidney disease: Secondary | ICD-10-CM | POA: Diagnosis not present

## 2022-06-13 DIAGNOSIS — D638 Anemia in other chronic diseases classified elsewhere: Secondary | ICD-10-CM | POA: Diagnosis not present

## 2022-06-13 NOTE — Therapy (Signed)
[facility] [place], [place], [address] Phone: [phone]   Fax:  [phone]  Modified Barium Swallow  Patient Details  Name: Amy Kelly MRN: [id number] Date of Birth: 04-Aug-1928 No data recorded  Encounter Date: 06/13/2022   End of Session - 06/13/22 1354     Visit Number 1    Number of Visits 1    Authorization Type BCBS Medicare    SLP Start Time [id number]    SLP Stop Time  1200    SLP Time Calculation (min) 25 min    Activity Tolerance Patient tolerated treatment well             Past Medical History:  Diagnosis Date   Anxiety and depression    At risk for falls    Coronary artery disease    Diabetes mellitus ([place]) 11/08/2020   Diabetes mellitus without complication (HCC)    GERD (gastroesophageal reflux disease)    History of skin cancer    Right shin   HOH (hard of hearing)    Hyperlipidemia    Hypertension    Hypothyroidism    Stroke [facility])     Past Surgical History:  Procedure Laterality Date   CARDIAC CATHETERIZATION     [id number]   CARDIAC CATHETERIZATION     x 2 stents   CARPAL TUNNEL RELEASE Left    CERVICAL [place] MAMMOGRAM     dnc     ESOPHAGEAL DILATION N/A 04/09/2022   Procedure: ESOPHAGEAL DILATION;  Surgeon: Harvel Quale, MD;  [facility];  Service: Gastroenterology;  Laterality: N/A;   ESOPHAGOGASTRODUODENOSCOPY (EGD) WITH PROPOFOL N/A 04/09/2022   Procedure: ESOPHAGOGASTRODUODENOSCOPY (EGD) WITH PROPOFOL;  Surgeon: Harvel Quale, MD;  [facility];  Service: Gastroenterology;  Laterality: N/A;  2:30pm, asa 3   HERNIA REPAIR     HYSTERECTOMY ABDOMINAL WITH SALPINGO-OOPHORECTOMY  1960   MASTECTOMY Right 2012   SKIN CANCER EXCISION     THORACIC SPINE SURGERY      There were no vitals filed for this visit.    06/13/22 1200  SLP Visit Information  SLP Received On 06/13/22   Pain Assessment  Pain Assessment No/denies pain  General Information  HPI Amy Kelly is 89 yo female who was referred for MBSS by Dr. Maylon Peppers due to Pt reports of difficulty swallowing pills. PMH anxiety, depression, CAD, DM, GERD status post Niesen fundoplication, HLD, HTN, h/o stroke, hypothyroidism, EGD, which she underwent on 04/09/2022.  There was no presence of abnormalities in her esophagus, this was empirically dilated with savory up to 18 mm.  There was presence of a Nissen fundoplication with loose wrap.  Normal stomach and duodenum. Patient reports that she is having issues when swallowing solids or liquids despite undergoing dilation.  She is having these choking spells frequently. She is concerned as she is not able to swallow some of her pills sometimes. She has to cough sometimes due to the symptom severity.  Caregiver present Yes  Diet Prior to this Study Regular;Thin liquids (Level 0)  Temperature  Normal  Respiratory Status WFL  Supplemental O2 None (Room air)  History of Recent Intubation No  Behavior/Cognition Alert;Cooperative;Pleasant mood  Self-Feeding Abilities Able to self-feed  Baseline vocal quality/speech Normal  Volitional Cough Able to elicit  Volitional Cough Assessment Appears WFL  Volitional Swallow Able to elicit  Anatomy WFL;Presence of cervical hardware  Orofacial  Exam  Oral Cavity: Oral Hygiene WFL  Oral Cavity - Dentition Missing dentition  Orofacial Anatomy WFL  Oral Motor/Sensory Function WFL  Boluses Administered  Boluses Administered Thin liquids (Level 0);Mildly thick liquids (Level 2, nectar thick);Puree;Solid  Oral Impairment Domain  Lip Closure No labial escape  Tongue control during bolus hold Cohesive bolus between tongue to palatal seal  Bolus preparation/mastication Slow prolonged chewing/mashing with complete recollection  Bolus transport/lingual motion Delayed initiation of tongue motion (oral holding)  Oral residue Trace  residue lining oral structures  Location of oral residue  Floor of mouth  Initiation of pharyngeal swallow  Posterior laryngeal surface of the epiglottis  Pharyngeal Impairment Domain  Soft palate elevation No bolus between soft palate (SP)/pharyngeal wall (PW)  Laryngeal elevation Complete superior movement of thyroid cartilage with complete approximation of arytenoids to epiglottic petiole  Anterior hyoid excursion Complete anterior movement  Epiglottic movement Complete inversion  Laryngeal vestibule closure Incomplete, narrow column air/contrast in laryngeal vestibule  Pharyngeal stripping wave  Present - complete  Pharyngeal contraction (A/P view only) N/A  Pharyngoesophageal segment opening Complete distension and complete duration, no obstruction of flow  Tongue base retraction Wide column of contrast or air between tongue base and PPW  Pharyngeal residue Collection of residue within or on pharyngeal structures  Location of pharyngeal residue Valleculae  Esophageal Impairment Domain  Esophageal clearance upright position Complete clearance, esophageal coating  Pill  Consistency administered Thin liquids (Level 0)  Thin liquids (Level 0) Impaired (see clinical impressions) (Pt aspirated thins when taking the pill, cough)  Penetration/Aspiration Scale Score  1.  Material does not enter airway Puree;Solid;Pill;Mildly thick liquids (Level 2, nectar thick)  6.  Material enters airway, passes BELOW cords then ejected out Thin liquids (Level 0)  Compensatory Strategies  Compensatory strategies No  Clinical Impression  Clinical Impression Pt presents with mild oropharyngeal dysphagia characterized by min tongue base retraction resulting in min vallecular residue with dry solids, which is reduced with repeat swallows and liquid wash. Pt exhibited premature spillage of thins when taking barium tablet with cup thin and with straw sips thin liquid resulting in trace aspiration of thins (when  taking pill) with immediate strong cough and expulsion and penetration to the vocal cords x1 with straw sips. Pt otherwise without penetration, aspiration, or significant residuals during the study. Recommend regular textures and thin liquids via cup sips (No straws) and take pills whole in puree and follow with liquid wash. No SLP treatment recommended at this time. This study and recommendations were reviewed with Pt and son. They were also given my contact information, should they have any additional questions or concerns.  SLP Visit Diagnosis Dysphagia, oropharyngeal phase (R13.12)  Factors that may increase risk of adverse event in presence of aspiration ([place] 2021) Respiratory or GI disease  Exam Limitations No limitations  Swallowing Evaluation Recommendations  Recommendations PO diet  PO Diet Recommendation Regular;Thin liquids (Level 0)  Liquid Administration via Cup;No straw  Medication Administration Whole meds with puree  Supervision Patient able to self-feed  Swallowing strategies   Multiple dry swallows after each bite/sip  Postural changes Position pt fully upright for meals  Oral care recommendations Oral care BID (2x/day)  Treatment Plan  Treatment recommendations No treatment recommended at this time  Follow-up recommendations No SLP follow up  Goal Planning  Prognosis for improved oropharyngeal function Good     Patient will benefit from skilled therapeutic intervention in order to improve the following deficits and impairments:  Dysphagia, oropharyngeal phase   Problem List Patient Active Problem List   Diagnosis Date Noted   Constipation 02/24/2022   Dysphagia 02/24/2022   History of UTI 10/08/2021   Urge incontinence 09/10/2021   Acute on chronic Diastolic heart failure with preserved ejection fraction (HFpEF) --Biventricular HF 11/11/2020   CKD (chronic kidney disease), stage IV ([place]) 11/09/2020   Acute respiratory failure with hypoxia ([place])  11/08/2020   HTN (hypertension) 11/08/2020   Bradycardia 11/08/2020   Fluid overload 11/08/2020   Hypothyroidism 11/08/2020   Anxiety 11/08/2020   Stroke ([place]) 11/08/2020   Anemia 11/08/2020   Thank you,  Genene Churn, [place]  [place], [place] 06/13/2022, 1:56 PM  [facility] [place], [place], [address] Phone: [phone]   Fax:  [phone]  Name: Amy Kelly MRN: [id number] Date of Birth: [birth year]

## 2022-06-18 DIAGNOSIS — N189 Chronic kidney disease, unspecified: Secondary | ICD-10-CM | POA: Diagnosis not present

## 2022-06-18 DIAGNOSIS — D638 Anemia in other chronic diseases classified elsewhere: Secondary | ICD-10-CM | POA: Diagnosis not present

## 2022-06-18 DIAGNOSIS — R809 Proteinuria, unspecified: Secondary | ICD-10-CM | POA: Diagnosis not present

## 2022-06-18 DIAGNOSIS — E1122 Type 2 diabetes mellitus with diabetic chronic kidney disease: Secondary | ICD-10-CM | POA: Diagnosis not present

## 2022-06-20 DIAGNOSIS — I5032 Chronic diastolic (congestive) heart failure: Secondary | ICD-10-CM | POA: Diagnosis not present

## 2022-06-20 DIAGNOSIS — E211 Secondary hyperparathyroidism, not elsewhere classified: Secondary | ICD-10-CM | POA: Diagnosis not present

## 2022-06-20 DIAGNOSIS — D638 Anemia in other chronic diseases classified elsewhere: Secondary | ICD-10-CM | POA: Diagnosis not present

## 2022-06-20 DIAGNOSIS — N189 Chronic kidney disease, unspecified: Secondary | ICD-10-CM | POA: Diagnosis not present

## 2022-07-03 ENCOUNTER — Ambulatory Visit (INDEPENDENT_AMBULATORY_CARE_PROVIDER_SITE_OTHER): Payer: Medicare Other | Admitting: Family Medicine

## 2022-07-03 ENCOUNTER — Other Ambulatory Visit: Payer: Self-pay

## 2022-07-03 DIAGNOSIS — G2581 Restless legs syndrome: Secondary | ICD-10-CM | POA: Diagnosis not present

## 2022-07-03 DIAGNOSIS — E1165 Type 2 diabetes mellitus with hyperglycemia: Secondary | ICD-10-CM

## 2022-07-03 DIAGNOSIS — I5032 Chronic diastolic (congestive) heart failure: Secondary | ICD-10-CM | POA: Insufficient documentation

## 2022-07-03 DIAGNOSIS — I10 Essential (primary) hypertension: Secondary | ICD-10-CM

## 2022-07-03 MED ORDER — AMLODIPINE BESYLATE 5 MG PO TABS: 5.0000 mg | ORAL_TABLET | Freq: Every day | ORAL | 3 refills | Status: DC

## 2022-07-03 MED ORDER — ROPINIROLE HCL 2 MG PO TABS: 2.0000 mg | ORAL_TABLET | Freq: Every day | ORAL | 1 refills | Status: DC

## 2022-07-03 NOTE — Patient Instructions (Signed)
Take short acting insulin with every meal.  I sent in the 5 mg Amlodipine.  I sent in the Requip.  Follow up in 3 months.

## 2022-07-06 NOTE — Progress Notes (Unsigned)
Subjective:  Patient ID: Amy Kelly, female    DOB: 06/21/1928  Age: 89 y.o. MRN: [id number]  CC: Chief Complaint  Patient presents with   Establish Care    C/o tremors    HPI:  89 year old female with the below mentioned medical problems presents to establish care. Issues/concerns are below.  Diabetes is not at goal. Son brought in readings from home (blood sugar readings and blood pressure readings). Fasting ranging from 98 to 249. Significant post prandial rise. She is not taking Novolog with each meal. Currently on 23 units of Lantus.     Patient Active Problem List   Diagnosis Date Noted   Chronic diastolic heart failure 07/03/2022   Constipation 02/24/2022   Dysphagia 02/24/2022   Urge incontinence 09/10/2021   CKD (chronic kidney disease), stage IV 11/09/2020   HTN (hypertension) 11/08/2020   Hypothyroidism 11/08/2020   Anxiety 11/08/2020   Stroke 11/08/2020   Anemia 11/08/2020    Social Hx   Social History   Socioeconomic History   Marital status: Widowed    Spouse name: Not on file   Number of children: Not on file   Years of education: Not on file   Highest education level: Not on file  Occupational History   Not on file  Tobacco Use   Smoking status: Never    Passive exposure: Never   Smokeless tobacco: Never  Vaping Use   Vaping Use: Never used  Substance and Sexual Activity   Alcohol use: Never   Drug use: Never   Sexual activity: Not on file  Other Topics Concern   Not on file  Social History Narrative   Right handed   Social Determinants of Health   Financial Resource Strain: Not on file  Food Insecurity: Not on file  Transportation Needs: Not on file  Physical Activity: Not on file  Stress: Not on file  Social Connections: Not on file    Review of Systems Per HPI  Objective:  BP 118/62   Pulse 66   Temp 98.4 F (36.9 C)   Ht  (1.6 m)   Wt 197 lb (89.4 kg)   SpO2 92%   BMI 34.90 kg/m      07/03/2022    1:49 PM  05/27/2022    4:06 PM 05/27/2022    3:20 PM  BP/Weight  Systolic BP 118 135 97  Diastolic BP 62 60 59  Wt. (Lbs) 197  199.3  BMI 34.9 kg/m2  35.3 kg/m2    Physical Exam  Lab Results  Component Value Date   WBC 12.0 (H) 01/28/2022   HGB 11.1 (L) 01/28/2022   HCT 34.2 (L) 01/28/2022   PLT 222 01/28/2022   GLUCOSE 227 (H) 01/28/2022   NA 135 01/28/2022   K 4.2 01/28/2022   CL 99 01/28/2022   CREATININE 2.18 (H) 01/28/2022   BUN 44 (H) 01/28/2022   CO2 27 01/28/2022   TSH 4.565 (H) 11/08/2020   HGBA1C 7.0 (H) 11/09/2020     Assessment & Plan:   Problem List Items Addressed This Visit       Cardiovascular and Mediastinum   Chronic diastolic heart failure - Primary   Relevant Medications   amLODipine (NORVASC) 5 MG tablet    Meds ordered this encounter  Medications   amLODipine (NORVASC) 5 MG tablet    Sig: Take 1 tablet (5 mg total) by mouth daily.    Dispense:  90 tablet    Refill:  3  rOPINIRole (REQUIP) 2 MG tablet    Sig: Take 1 tablet (2 mg total) by mouth at bedtime.    Dispense:  90 tablet    Refill:  1    Follow-up:  Return in about 3 months (around 10/02/2022).  Everlene Other DO [facility]

## 2022-07-07 DIAGNOSIS — E1165 Type 2 diabetes mellitus with hyperglycemia: Secondary | ICD-10-CM | POA: Insufficient documentation

## 2022-07-07 DIAGNOSIS — G2581 Restless legs syndrome: Secondary | ICD-10-CM | POA: Insufficient documentation

## 2022-07-07 MED ORDER — BLOOD GLUCOSE TEST VI STRP: ORAL_STRIP | 3 refills | Status: DC

## 2022-07-07 MED ORDER — BLOOD GLUCOSE MONITORING SUPPL DEVI: 0 refills | Status: AC

## 2022-07-07 NOTE — Assessment & Plan Note (Signed)
Starting on Requip.

## 2022-07-07 NOTE — Assessment & Plan Note (Signed)
Stable. Amlodipine 5 mg sent.

## 2022-07-07 NOTE — Assessment & Plan Note (Signed)
Advised to continue current dose of Lantus and be sure to take bolus insulin with each meal.

## 2022-07-08 ENCOUNTER — Telehealth: Payer: Self-pay | Admitting: Pharmacy Technician

## 2022-07-08 NOTE — Telephone Encounter (Signed)
Auth Submission: NO AUTH NEEDED Site of care: Site of care: [facility] Payer: [employer] MEDICARE Medication & CPT/J Code(s) submitted: Feraheme (ferumoxytol) [id number] Route of submission (phone, fax, portal):  Phone # Fax # Auth type: Buy/Bill Units/visits requested: 2 Reference number:  Approval from: 07/04/22 to 03/18/23

## 2022-07-10 ENCOUNTER — Encounter (HOSPITAL_COMMUNITY)
Admission: RE | Admit: 2022-07-10 | Discharge: 2022-07-10 | Disposition: A | Discharge status: Home / Self Care | Payer: Medicare Other | Source: Ambulatory Visit | Attending: Nephrology | Admitting: Nephrology

## 2022-07-10 VITALS — BP 137/50 | HR 61 | Temp 98.1°F | Resp 14

## 2022-07-10 DIAGNOSIS — D631 Anemia in chronic kidney disease: Secondary | ICD-10-CM | POA: Insufficient documentation

## 2022-07-10 DIAGNOSIS — D500 Iron deficiency anemia secondary to blood loss (chronic): Secondary | ICD-10-CM

## 2022-07-10 DIAGNOSIS — N184 Chronic kidney disease, stage 4 (severe): Secondary | ICD-10-CM | POA: Diagnosis not present

## 2022-07-10 DIAGNOSIS — D519 Vitamin B12 deficiency anemia, unspecified: Secondary | ICD-10-CM | POA: Insufficient documentation

## 2022-07-10 MED ORDER — SODIUM CHLORIDE 0.9 % IV SOLN
510.0000 mg | Freq: Once | INTRAVENOUS | Status: AC
  Administered 2022-07-10: 510 mg via INTRAVENOUS
  Filled 2022-07-10: qty 510

## 2022-07-10 MED ORDER — ACETAMINOPHEN 325 MG PO TABS
650.0000 mg | ORAL_TABLET | Freq: Once | ORAL | Status: AC
  Administered 2022-07-10: 650 mg via ORAL

## 2022-07-10 MED ORDER — DIPHENHYDRAMINE HCL 25 MG PO CAPS
25.0000 mg | ORAL_CAPSULE | Freq: Once | ORAL | Status: AC
  Administered 2022-07-10: 25 mg via ORAL

## 2022-07-10 NOTE — Progress Notes (Signed)
Diagnosis: Iron Deficiency Anemia  Provider:  Manpreet Bhutani MD  Procedure: IV Infusion  IV Type: Peripheral, IV Location: R Forearm  Feraheme (Ferumoxytol), Dose: 510 mg  Infusion Start Time: 1410  Infusion Stop Time: 1426  Post Infusion IV Care: Observation period completed and Peripheral IV Discontinued  Discharge: Condition: Good, Destination: Home . AVS Declined  Performed by:  Wyvonne Lenz, RN

## 2022-07-17 ENCOUNTER — Encounter (HOSPITAL_COMMUNITY)
Admission: RE | Admit: 2022-07-17 | Discharge: 2022-07-17 | Disposition: A | Discharge status: Home / Self Care | Payer: Medicare Other | Source: Ambulatory Visit | Attending: Nephrology | Admitting: Nephrology

## 2022-07-17 VITALS — BP 154/70 | HR 62 | Temp 97.4°F | Resp 22

## 2022-07-17 DIAGNOSIS — D500 Iron deficiency anemia secondary to blood loss (chronic): Secondary | ICD-10-CM | POA: Insufficient documentation

## 2022-07-17 MED ORDER — ACETAMINOPHEN 325 MG PO TABS
650.0000 mg | ORAL_TABLET | Freq: Once | ORAL | Status: AC
  Administered 2022-07-17: 650 mg via ORAL
  Filled 2022-07-17: qty 2

## 2022-07-17 MED ORDER — SODIUM CHLORIDE 0.9 % IV SOLN
510.0000 mg | Freq: Once | INTRAVENOUS | Status: AC
  Administered 2022-07-17: 510 mg via INTRAVENOUS
  Filled 2022-07-17: qty 510

## 2022-07-17 MED ORDER — DIPHENHYDRAMINE HCL 25 MG PO CAPS
25.0000 mg | ORAL_CAPSULE | Freq: Once | ORAL | Status: AC
  Administered 2022-07-17: 25 mg via ORAL
  Filled 2022-07-17: qty 1

## 2022-07-17 NOTE — Addendum Note (Signed)
Encounter addended by: Kandra Nicolas, RN on: 07/17/2022 3:10 PM  Actions taken: Therapy plan modified

## 2022-07-17 NOTE — Progress Notes (Signed)
Diagnosis: Iron Deficiency Anemia  Provider:  Manpreet Bhutani MD  Procedure: IV Infusion  IV Type: Peripheral, IV Location: R Forearm  Feraheme (Ferumoxytol), Dose: 510 mg  Infusion Start Time: 1358  Infusion Stop Time: 1415  Post Infusion IV Care: Observation period declined and peripheral IV discontinued.  Discharge: Condition: Good, Destination: Home . AVS Provided.  Performed by:  Wyvonne Lenz, RN

## 2022-07-28 ENCOUNTER — Other Ambulatory Visit (INDEPENDENT_AMBULATORY_CARE_PROVIDER_SITE_OTHER): Payer: Self-pay | Admitting: Gastroenterology

## 2022-07-31 ENCOUNTER — Encounter (HOSPITAL_COMMUNITY): Payer: Self-pay | Admitting: Nephrology

## 2022-08-06 NOTE — Progress Notes (Unsigned)
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  Secondary stroke prevention as managed by PCP: - Plavix 75mg  daily - Statin therapy:  LDL goal less than 70 - Glycemic control:  Hgb A1c goal less than 7 - Normotensive blood pressure 2.  Follow up 1 year     Subjective:  Amy Kelly is an 89 year old right-handed female with HTN, DM II, HLD, CAD, CKD and RLS who follows up for right thalamic infarct.  Accompanied by her son who supplements history.   UPDATE: Current medications:  Plavix 75mg , rosuvastatin 40mg , amlodipine, losartan glimepiride, gabapentin 100mg  BID, ropinirole 2mg  QHS   Last visit, increased gabapentin 100mg  to three times daily.  However, it makes her too drowsy, so she has continued twice a day.   If she rotates her forearm, she feels a discomfort.  She can't describe the sensation.    04/04/2022 BMP:  Na 141, K 4.2, Cl 101, CO2 26, Ca 9, glucose 249, BUN 49, Cr 2.67, GFR 16. 06/13/2022 BMP:  Na 137, K 4.2, Cl 96, CO2 29, Ca 9, glucose 292, BUN 40, Cr 2.64, GFR 17  HISTORY: Patient had a stroke in November 2021, presenting with left sided weakness and numbness and blurred vision.  She presented to the hospital in [place], [place].  CT head showed no acute findings but MRI of brain showed acute/subacute lacunar infarct within the right thalamus.  She was outside the window for tPA.  Carotid ultrasound demonstrated bilateral calcified plaque without hemodynamically significant stenosis.She was on ASA prior to stroke and discharged on DAPT.  Currently still taking ASA with Plavix.  She reports ongoing numbness and discomfort involving the left side of her head, face, arm, hand and leg.  She already has underlying polyneuropathy, gout and  arthritis that impedes her balance and is now less steady.  Uses a rolling walker.      PAST MEDICAL HISTORY: Past Medical History:  Diagnosis Date   Anxiety and depression    At risk for falls    Coronary artery disease    Diabetes mellitus (HCC) 11/08/2020   Diabetes mellitus without complication (HCC)    GERD (gastroesophageal reflux disease)    History of skin cancer    Right shin   HOH (hard of hearing)    Hyperlipidemia    Hypertension    Hypothyroidism    Stroke [facility])     MEDICATIONS: Current Outpatient Medications on File Prior to Visit  Medication Sig Dispense Refill   amLODipine (NORVASC) 5 MG tablet Take 1 tablet (5 mg total) by mouth daily. 90 tablet 3   Ascorbic Acid (VITAMIN C GUMMIE PO) Take 1,000 mg by mouth in the morning.     B Complex-C (B-COMPLEX WITH VITAMIN C) tablet Take 1 tablet by mouth in the morning.     Blood Glucose Monitoring Suppl DEVI Use up to 4 times daily to check blood sugar. May substitute to any manufacturer covered by patient's insurance. 1 each 0   calcitRIOL (ROCALTROL) 0.25 MCG capsule Take 0.25 mcg by mouth every Monday, Wednesday, and Friday.     Calcium-Phosphorus-Vitamin D (CALCIUM GUMMIES PO) Take 2 tablets by mouth in the morning.     Cholecalciferol (VITAMIN D3) 125 MCG (5000 UT) TABS Take 5,000 Units by mouth in  the morning.     clobetasol (TEMOVATE) 0.05 % external solution Apply 1 Application topically in the morning. Applied to the back of the neck     clopidogrel (PLAVIX) 75 MG tablet Take 75 mg by mouth in the morning.     doxazosin (CARDURA) 2 MG tablet Take 2 mg by mouth at bedtime.     escitalopram (LEXAPRO) 10 MG tablet Take 10 mg by mouth in the morning.     famotidine (PEPCID) 20 MG tablet TAKE 1 TABLET BY MOUTH AT BEDTIME 90 tablet 3   folic acid (FOLVITE) 800 MCG tablet Take 800 mcg by mouth in the morning.     furosemide (LASIX) 20 MG tablet Take 20 mg by mouth in the morning. 20 mg + 40 mg=60 mg     furosemide  (LASIX) 40 MG tablet Take 1 tablet (40 mg total) by mouth every morning. For fluid (Patient taking differently: Take 40 mg by mouth every morning. 40 mg + 20 mg= 60 mg) 30 tablet 5   gabapentin (NEURONTIN) 100 MG capsule Take 1 capsule (100 mg total) by mouth 3 (three) times daily. (Patient taking differently: Take 100 mg by mouth in the morning and at bedtime.) 90 capsule 5   Glucose Blood (BLOOD GLUCOSE TEST STRIPS) STRP Use up to 4 times daily to check blood sugar. May substitute to any manufacturer covered by patient's insurance. 300 strip 3   insulin aspart (NOVOLOG) 100 UNIT/ML FlexPen Inject 1-5 Units into the skin every evening. Dose per sliding scale     insulin glargine (LANTUS SOLOSTAR) 100 UNIT/ML Solostar Pen Inject 23 Units into the skin daily.     levothyroxine (SYNTHROID) 50 MCG tablet Take 50 mcg by mouth daily before breakfast.     loratadine (CLARITIN) 10 MG tablet Take 10 mg by mouth in the morning.     Multiple Vitamin (MULTIVITAMIN WITH MINERALS) TABS tablet Take 1 tablet by mouth in the morning.     Olopatadine HCl (PATADAY OP) Place 1 drop into both eyes in the morning. Once in the mornings in left eye     omeprazole (PRILOSEC) 40 MG capsule Take 40 mg by mouth daily before breakfast.     polyethylene glycol (MIRALAX / GLYCOLAX) 17 g packet Take 17 g by mouth every other day.     potassium chloride (KLOR-CON) 10 MEQ tablet Take 1 tablet (10 mEq total) by mouth daily. Take While taking Lasix/furosemide 30 tablet 2   rOPINIRole (REQUIP) 2 MG tablet Take 1 tablet (2 mg total) by mouth at bedtime. 90 tablet 1   rosuvastatin (CRESTOR) 40 MG tablet Take 40 mg by mouth at bedtime.     Vibegron (GEMTESA) 75 MG TABS Take 75 mg by mouth in the morning.     vitamin B-12 (CYANOCOBALAMIN) 500 MCG tablet Take 1,000 mcg by mouth in the morning.     No current facility-administered medications on file prior to visit.    ALLERGIES: Allergies  Allergen Reactions   Fesoterodine      Severe dry mouth   Penicillins Diarrhea and Nausea Only   Prednisone Diarrhea and Nausea Only   Solifenacin     Severe dry mouth   Sulfa Antibiotics Diarrhea and Nausea Only    FAMILY HISTORY: No family history on file.    Objective:  Blood pressure 110/62, pulse 98, height 5\' 3"  (1.6 m), weight 220 lb (99.8 kg), SpO2 96 %. General: No acute distress.  Patient appears well-groomed.   Head:  Normocephalic/atraumatic Eyes:  Fundi examined but not visualized Neck: supple, no paraspinal tenderness, full range of motion Neurological Exam: alert and oriented.  Speech fluent and not dysarthric.  Language intact.  Bilateral hearing loss.  Otherwise, CN II-XII intact.  Bulk and tone normal.  Muscle strength 5/5 throughout.  Sensation to light touch intact  Deep tendon reflexes absent throughout.  Finger to nose testing intact.  Cautious wide-based gait.  Uses walker.   Shon Millet, DO  CC: Everlene Other, DO

## 2022-08-07 ENCOUNTER — Ambulatory Visit: Payer: Medicare Other | Admitting: Neurology

## 2022-08-07 ENCOUNTER — Encounter: Payer: Self-pay | Admitting: Neurology

## 2022-08-07 VITALS — BP 110/62 | HR 98 | Ht 63.0 in | Wt 220.0 lb

## 2022-08-07 DIAGNOSIS — G890 Central pain syndrome: Secondary | ICD-10-CM | POA: Diagnosis not present

## 2022-08-07 DIAGNOSIS — I10 Essential (primary) hypertension: Secondary | ICD-10-CM | POA: Diagnosis not present

## 2022-08-07 DIAGNOSIS — I6381 Other cerebral infarction due to occlusion or stenosis of small artery: Secondary | ICD-10-CM | POA: Diagnosis not present

## 2022-08-07 DIAGNOSIS — E785 Hyperlipidemia, unspecified: Secondary | ICD-10-CM

## 2022-08-07 DIAGNOSIS — E1142 Type 2 diabetes mellitus with diabetic polyneuropathy: Secondary | ICD-10-CM | POA: Diagnosis not present

## 2022-08-07 NOTE — Patient Instructions (Signed)
Gabapentin 100 mg twice daily

## 2022-08-08 ENCOUNTER — Telehealth: Payer: Self-pay | Admitting: Family Medicine

## 2022-08-08 NOTE — Telephone Encounter (Signed)
Patient's son has some questions about how she suppose to take her insulin. Please advise

## 2022-08-09 NOTE — Telephone Encounter (Signed)
Amy Sams, DO     Spoke with son and  gave recommendations.

## 2022-08-26 DIAGNOSIS — R809 Proteinuria, unspecified: Secondary | ICD-10-CM | POA: Diagnosis not present

## 2022-08-26 DIAGNOSIS — N183 Chronic kidney disease, stage 3 unspecified: Secondary | ICD-10-CM | POA: Diagnosis not present

## 2022-08-26 DIAGNOSIS — E611 Iron deficiency: Secondary | ICD-10-CM | POA: Diagnosis not present

## 2022-08-26 DIAGNOSIS — D631 Anemia in chronic kidney disease: Secondary | ICD-10-CM | POA: Diagnosis not present

## 2022-08-29 DIAGNOSIS — D638 Anemia in other chronic diseases classified elsewhere: Secondary | ICD-10-CM | POA: Diagnosis not present

## 2022-08-29 DIAGNOSIS — R300 Dysuria: Secondary | ICD-10-CM | POA: Diagnosis not present

## 2022-08-29 DIAGNOSIS — E1122 Type 2 diabetes mellitus with diabetic chronic kidney disease: Secondary | ICD-10-CM | POA: Diagnosis not present

## 2022-08-29 DIAGNOSIS — R32 Unspecified urinary incontinence: Secondary | ICD-10-CM | POA: Diagnosis not present

## 2022-08-29 DIAGNOSIS — N2581 Secondary hyperparathyroidism of renal origin: Secondary | ICD-10-CM | POA: Diagnosis not present

## 2022-08-29 DIAGNOSIS — E1129 Type 2 diabetes mellitus with other diabetic kidney complication: Secondary | ICD-10-CM | POA: Diagnosis not present

## 2022-08-29 DIAGNOSIS — R809 Proteinuria, unspecified: Secondary | ICD-10-CM | POA: Diagnosis not present

## 2022-08-30 ENCOUNTER — Ambulatory Visit (INDEPENDENT_AMBULATORY_CARE_PROVIDER_SITE_OTHER): Payer: Medicare Other

## 2022-08-30 VITALS — BP 124/26 | HR 51 | Ht 63.0 in | Wt 191.0 lb

## 2022-08-30 DIAGNOSIS — Z0000 Encounter for general adult medical examination without abnormal findings: Secondary | ICD-10-CM

## 2022-08-30 NOTE — Patient Instructions (Signed)
Ms. Woodhouse , Thank you for taking time to come for your Medicare Wellness Visit. I appreciate your ongoing commitment to your health goals. Please review the following plan we discussed and let me know if I can assist you in the future.   These are the goals we discussed:  Goals       Patient Stated (pt-stated)      Patients goal is to be able to ambulate better and to use her walker less        This is a list of the screening recommended for you and due dates:  Health Maintenance  Topic Date Due   Complete foot exam   Never done   Eye exam for diabetics  Never done   DTaP/Tdap/Td vaccine (1 - Tdap) Never done   Zoster (Shingles) Vaccine (1 of 2) Never done   Pneumonia Vaccine (1 of 1 - PCV) Never done   DEXA scan (bone density measurement)  Never done   Hemoglobin A1C  05/12/2021   COVID-19 Vaccine (4 - 2023-24 season) 11/16/2021   Flu Shot  10/17/2022   Medicare Annual Wellness Visit  08/30/2023   HPV Vaccine  Aged Out    Advanced directives: Advance directive discussed with you today. Even though you declined this today, please call our office should you change your mind, and we can give you the proper paperwork for you to fill out. Advance care planning is a way to make decisions about medical care that fits your values in case you are ever unable to make these decisions for yourself.  Information on Advanced Care Planning can be found at [facility] [place] Advance Health Care Directives Advance Health Care Directives ([email or web address]    Conditions/risks identified: Exercises to do While Sitting  Exercises that you do while sitting (chair exercises) can give you many of the same benefits as full exercise. Benefits include strengthening your heart, burning calories, and keeping muscles and joints healthy. Exercise can also improve your mood and help with depression and anxiety. You may benefit from chair exercises if you are unable to do standing exercises due  to: Diabetic foot pain. Obesity. Illness. Arthritis. Recovery from surgery or injury. Breathing problems. Balance problems. Another type of disability. Before starting chair exercises, check with your health care provider or a physical therapist to find out how much exercise you can tolerate and which exercises are safe for you. If your health care provider approves: Start out slowly and build up over time. Aim to work up to about 10-20 minutes for each exercise session. Make exercise part of your daily routine. Drink water when you exercise. Do not wait until you are thirsty. Drink every 10-15 minutes. Stop exercising right away if you have pain, nausea, shortness of breath, or dizziness. If you are exercising in a wheelchair, make sure to lock the wheels. Ask your health care provider whether you can do tai chi or yoga. Many positions in these mind-body exercises can be modified to do while seated. Warm-up Before starting other exercises: Sit up as straight as you can. Have your knees bent at 90 degrees, which is the shape of the capital letter "L." Keep your feet flat on the floor. Sit at the front edge of your chair, if you can. Pull in (tighten) the muscles in your abdomen and stretch your spine and neck as straight as you can. Hold this position for a few minutes. Breathe in and out evenly. Try to concentrate on your breathing,  and relax your mind. Stretching Exercise A: Arm stretch Hold your arms out straight in front of your body. Bend your hands at the wrist with your fingers pointing up, as if signaling someone to stop. Notice the slight tension in your forearms as you hold the position. Keeping your arms out and your hands bent, rotate your hands outward as far as you can and hold this stretch. Aim to have your thumbs pointing up and your pinkie fingers pointing down. Slowly repeat arm stretches for one minute as tolerated. Exercise B: Leg stretch If you can move your legs,  try to "draw" letters on the floor with the toes of your foot. Write your name with one foot. Write your name with the toes of your other foot. Slowly repeat the movements for one minute as tolerated. Exercise C: Reach for the sky Reach your hands as far over your head as you can to stretch your spine. Move your hands and arms as if you are climbing a rope. Slowly repeat the movements for one minute as tolerated. Range of motion exercises Exercise A: Shoulder roll Let your arms hang loosely at your sides. Lift just your shoulders up toward your ears, then let them relax back down. When your shoulders feel loose, rotate your shoulders in backward and forward circles. Do shoulder rolls slowly for one minute as tolerated. Exercise B: March in place As if you are marching, pump your arms and lift your legs up and down. Lift your knees as high as you can. If you are unable to lift your knees, just pump your arms and move your ankles and feet up and down. March in place for one minute as tolerated. Exercise C: Seated jumping jacks Let your arms hang down straight. Keeping your arms straight, lift them up over your head. Aim to point your fingers to the ceiling. While you lift your arms, straighten your legs and slide your heels along the floor to your sides, as wide as you can. As you bring your arms back down to your sides, slide your legs back together. If you are unable to use your legs, just move your arms. Slowly repeat seated jumping jacks for one minute as tolerated. Strengthening exercises Exercise A: Shoulder squeeze Hold your arms straight out from your body to your sides, with your elbows bent and your fists pointed at the ceiling. Keeping your arms in the bent position, move them forward so your elbows and forearms meet in front of your face. Open your arms back out as wide as you can with your elbows still bent, until you feel your shoulder blades squeezing together. Hold for 5  seconds. Slowly repeat the movements forward and backward for one minute as tolerated. Contact a health care provider if: You have to stop exercising due to any of the following: Pain. Nausea. Shortness of breath. Dizziness. Fatigue. You have significant pain or soreness after exercising. Get help right away if: You have chest pain. You have difficulty breathing. These symptoms may represent a serious problem that is an emergency. Do not wait to see if the symptoms will go away. Get medical help right away. Call your local emergency services (911 in the [place] Do not drive yourself to the hospital. Summary Exercises that you do while sitting (chair exercises) can strengthen your heart, burn calories, and keep muscles and joints healthy. You may benefit from chair exercises if you are unable to do standing exercises due to diabetic foot pain, obesity, recovery from  surgery or injury, or other conditions. Before starting chair exercises, check with your health care provider or a physical therapist to find out how much exercise you can tolerate and which exercises are safe for you. This information is not intended to replace advice given to you by your health care provider. Make sure you discuss any questions you have with your health care provider. Document Revised: 04/30/2020 Document Reviewed: 04/30/2020 Elsevier Patient Education  2024 [employer].   Next appointment: Follow up in one year for your annual wellness visit  September 05, 2023 at 2:30pm telephone visit   Preventive Care 65 Years and Older, Female Preventive care refers to lifestyle choices and visits with your health care provider that can promote health and wellness. What does preventive care include? A yearly physical exam. This is also called an annual well check. Dental exams once or twice a year. Routine eye exams. Ask your health care provider how often you should have your eyes checked. Personal lifestyle choices,  including: Daily care of your teeth and gums. Regular physical activity. Eating a healthy diet. Avoiding tobacco and drug use. Limiting alcohol use. Practicing safe sex. Taking low-dose aspirin every day. Taking vitamin and mineral supplements as recommended by your health care provider. What happens during an annual well check? The services and screenings done by your health care provider during your annual well check will depend on your age, overall health, lifestyle risk factors, and family history of disease. Counseling  Your health care provider may ask you questions about your: Alcohol use. Tobacco use. Drug use. Emotional well-being. Home and relationship well-being. Sexual activity. Eating habits. History of falls. Memory and ability to understand (cognition). Work and work Astronomer. Reproductive health. Screening  You may have the following tests or measurements: Height, weight, and BMI. Blood pressure. Lipid and cholesterol levels. These may be checked every 5 years, or more frequently if you are over 64 years old. Skin check. Lung cancer screening. You may have this screening every year starting at age 63 if you have a 30-pack-year history of smoking and currently smoke or have quit within the past 15 years. Fecal occult blood test (FOBT) of the stool. You may have this test every year starting at age 44. Flexible sigmoidoscopy or colonoscopy. You may have a sigmoidoscopy every 5 years or a colonoscopy every 10 years starting at age 51. Hepatitis C blood test. Hepatitis B blood test. Sexually transmitted disease (STD) testing. Diabetes screening. This is done by checking your blood sugar (glucose) after you have not eaten for a while (fasting). You may have this done every 1-3 years. Bone density scan. This is done to screen for osteoporosis. You may have this done starting at age 98. Mammogram. This may be done every 1-2 years. Talk to your health care provider  about how often you should have regular mammograms. Talk with your health care provider about your test results, treatment options, and if necessary, the need for more tests. Vaccines  Your health care provider may recommend certain vaccines, such as: Influenza vaccine. This is recommended every year. Tetanus, diphtheria, and acellular pertussis (Tdap, Td) vaccine. You may need a Td booster every 10 years. Zoster vaccine. You may need this after age 29. Pneumococcal 13-valent conjugate (PCV13) vaccine. One dose is recommended after age 61. Pneumococcal polysaccharide (PPSV23) vaccine. One dose is recommended after age 71. Talk to your health care provider about which screenings and vaccines you need and how often you need them. This information  is not intended to replace advice given to you by your health care provider. Make sure you discuss any questions you have with your health care provider. Document Released: 03/31/2015 Document Revised: 11/22/2015 Document Reviewed: 01/03/2015 Elsevier Interactive Patient Education  2017 [employer].  Fall Prevention in the Home Falls can cause injuries. They can happen to people of all ages. There are many things you can do to make your home safe and to help prevent falls. What can I do on the outside of my home? Regularly fix the edges of walkways and driveways and fix any cracks. Remove anything that might make you trip as you walk through a door, such as a raised step or threshold. Trim any bushes or trees on the path to your home. Use bright outdoor lighting. Clear any walking paths of anything that might make someone trip, such as rocks or tools. Regularly check to see if handrails are loose or broken. Make sure that both sides of any steps have handrails. Any raised decks and porches should have guardrails on the edges. Have any leaves, snow, or ice cleared regularly. Use sand or salt on walking paths during winter. Clean up any spills in  your garage right away. This includes oil or grease spills. What can I do in the bathroom? Use night lights. Install grab bars by the toilet and in the tub and shower. Do not use towel bars as grab bars. Use non-skid mats or decals in the tub or shower. If you need to sit down in the shower, use a plastic, non-slip stool. Keep the floor dry. Clean up any water that spills on the floor as soon as it happens. Remove soap buildup in the tub or shower regularly. Attach bath mats securely with double-sided non-slip rug tape. Do not have throw rugs and other things on the floor that can make you trip. What can I do in the bedroom? Use night lights. Make sure that you have a light by your bed that is easy to reach. Do not use any sheets or blankets that are too big for your bed. They should not hang down onto the floor. Have a firm chair that has side arms. You can use this for support while you get dressed. Do not have throw rugs and other things on the floor that can make you trip. What can I do in the kitchen? Clean up any spills right away. Avoid walking on wet floors. Keep items that you use a lot in easy-to-reach places. If you need to reach something above you, use a strong step stool that has a grab bar. Keep electrical cords out of the way. Do not use floor polish or wax that makes floors slippery. If you must use wax, use non-skid floor wax. Do not have throw rugs and other things on the floor that can make you trip. What can I do with my stairs? Do not leave any items on the stairs. Make sure that there are handrails on both sides of the stairs and use them. Fix handrails that are broken or loose. Make sure that handrails are as long as the stairways. Check any carpeting to make sure that it is firmly attached to the stairs. Fix any carpet that is loose or worn. Avoid having throw rugs at the top or bottom of the stairs. If you do have throw rugs, attach them to the floor with carpet  tape. Make sure that you have a light switch at the top of  the stairs and the bottom of the stairs. If you do not have them, ask someone to add them for you. What else can I do to help prevent falls? Wear shoes that: Do not have high heels. Have rubber bottoms. Are comfortable and fit you well. Are closed at the toe. Do not wear sandals. If you use a stepladder: Make sure that it is fully opened. Do not climb a closed stepladder. Make sure that both sides of the stepladder are locked into place. Ask someone to hold it for you, if possible. Clearly mark and make sure that you can see: Any grab bars or handrails. First and last steps. Where the edge of each step is. Use tools that help you move around (mobility aids) if they are needed. These include: Canes. Walkers. Scooters. Crutches. Turn on the lights when you go into a dark area. Replace any light bulbs as soon as they burn out. Set up your furniture so you have a clear path. Avoid moving your furniture around. If any of your floors are uneven, fix them. If there are any pets around you, be aware of where they are. Review your medicines with your doctor. Some medicines can make you feel dizzy. This can increase your chance of falling. Ask your doctor what other things that you can do to help prevent falls. This information is not intended to replace advice given to you by your health care provider. Make sure you discuss any questions you have with your health care provider. Document Released: 12/29/2008 Document Revised: 08/10/2015 Document Reviewed: 04/08/2014 Elsevier Interactive Patient Education  2017 [employer].

## 2022-08-30 NOTE — Progress Notes (Signed)
I connected with  Amy Kelly on 08/30/22 by a audio enabled telemedicine application and verified that I am speaking with the correct person using two identifiers.  Patient Location: Home  Provider Location: Home Office  I discussed the limitations of evaluation and management by telemedicine. The patient expressed understanding and agreed to proceed.  Subjective:   Amy Kelly is a 89 y.o. female who presents for Medicare Annual (Subsequent) preventive examination.  Review of Systems     Cardiac Risk Factors include: advanced age (>26men, >69 women);diabetes mellitus;dyslipidemia;hypertension;sedentary lifestyle;obesity (BMI >30kg/m2)     Objective:    Today's Vitals   08/30/22 1444 08/30/22 1445  BP: (!) 124/26   Pulse: (!) 51   Weight: 191 lb (86.6 kg)   Height: 5\' 3"  (1.6 m)   PainSc:  6    Body mass index is 33.83 kg/m.     08/30/2022    2:59 PM 08/07/2022    2:20 PM 04/09/2022   11:33 AM 04/04/2022   11:16 AM 01/28/2022    3:12 PM 10/02/2021    2:02 PM 03/27/2021   10:48 AM  Advanced Directives  Does Patient Have a Medical Advance Directive? No Yes No No Yes Yes No  Type of Special educational needs teacher of [place];Living will   Healthcare Power of [employer] of [place];Living will   Would patient like information on creating a medical advance directive? No - Patient declined  No - Patient declined No - Patient declined       Current Medications (verified) Outpatient Encounter Medications as of 08/30/2022  Medication Sig   amLODipine (NORVASC) 5 MG tablet Take 1 tablet (5 mg total) by mouth daily.   Ascorbic Acid (VITAMIN C GUMMIE PO) Take 1,000 mg by mouth in the morning.   B Complex-C (B-COMPLEX WITH VITAMIN C) tablet Take 1 tablet by mouth in the morning.   Blood Glucose Monitoring Suppl DEVI Use up to 4 times daily to check blood sugar. May substitute to any manufacturer covered by patient's insurance.   calcitRIOL (ROCALTROL) 0.25 MCG  capsule Take 0.25 mcg by mouth every Monday, Wednesday, and Friday.   Calcium-Phosphorus-Vitamin D (CALCIUM GUMMIES PO) Take 2 tablets by mouth in the morning.   Cholecalciferol (VITAMIN D3) 125 MCG (5000 UT) TABS Take 5,000 Units by mouth in the morning.   clobetasol (TEMOVATE) 0.05 % external solution Apply 1 Application topically in the morning. Applied to the back of the neck   clopidogrel (PLAVIX) 75 MG tablet Take 75 mg by mouth in the morning.   doxazosin (CARDURA) 2 MG tablet Take 2 mg by mouth at bedtime.   escitalopram (LEXAPRO) 10 MG tablet Take 10 mg by mouth in the morning.   famotidine (PEPCID) 20 MG tablet TAKE 1 TABLET BY MOUTH AT BEDTIME   folic acid (FOLVITE) 800 MCG tablet Take 800 mcg by mouth in the morning.   furosemide (LASIX) 20 MG tablet Take 20 mg by mouth in the morning. 20 mg + 40 mg=60 mg   furosemide (LASIX) 40 MG tablet Take 1 tablet (40 mg total) by mouth every morning. For fluid (Patient taking differently: Take 40 mg by mouth every morning. 40 mg + 20 mg= 60 mg)   gabapentin (NEURONTIN) 100 MG capsule Take 1 capsule (100 mg total) by mouth 3 (three) times daily. (Patient taking differently: Take 100 mg by mouth in the morning and at bedtime.)   Glucose Blood (BLOOD GLUCOSE TEST STRIPS) STRP Use up to 4  times daily to check blood sugar. May substitute to any manufacturer covered by patient's insurance.   insulin aspart (NOVOLOG) 100 UNIT/ML FlexPen Inject 1-5 Units into the skin every evening. Dose per sliding scale   insulin glargine (LANTUS SOLOSTAR) 100 UNIT/ML Solostar Pen Inject 23 Units into the skin daily.   levothyroxine (SYNTHROID) 50 MCG tablet Take 50 mcg by mouth daily before breakfast.   loratadine (CLARITIN) 10 MG tablet Take 10 mg by mouth in the morning.   Multiple Vitamin (MULTIVITAMIN WITH MINERALS) TABS tablet Take 1 tablet by mouth in the morning.   Olopatadine HCl (PATADAY OP) Place 1 drop into both eyes in the morning. Once in the mornings in  left eye   omeprazole (PRILOSEC) 40 MG capsule Take 40 mg by mouth daily before breakfast.   polyethylene glycol (MIRALAX / GLYCOLAX) 17 g packet Take 17 g by mouth every other day.   rOPINIRole (REQUIP) 2 MG tablet Take 1 tablet (2 mg total) by mouth at bedtime.   rosuvastatin (CRESTOR) 40 MG tablet Take 40 mg by mouth at bedtime.   Vibegron (GEMTESA) 75 MG TABS Take 75 mg by mouth in the morning.   vitamin B-12 (CYANOCOBALAMIN) 500 MCG tablet Take 1,000 mcg by mouth in the morning.   No facility-administered encounter medications on file as of 08/30/2022.    Allergies (verified) Fesoterodine, Penicillins, Prednisone, Solifenacin, and Sulfa antibiotics   History: Past Medical History:  Diagnosis Date   Allergy    Anxiety and depression    Arthritis    At risk for falls    Cancer [facility])    Cataract    CHF (congestive heart failure) (HCC)    Chronic kidney disease    Coronary artery disease    Diabetes mellitus (HCC) 11/08/2020   Diabetes mellitus without complication (HCC)    GERD (gastroesophageal reflux disease)    History of skin cancer    Right shin   HOH (hard of hearing)    Hyperlipidemia    Hypertension    Hypothyroidism    Neuromuscular disorder (HCC)    Osteoporosis    Oxygen deficiency    Stroke [facility])    Ulcer    Past Surgical History:  Procedure Laterality Date   ABDOMINAL HYSTERECTOMY     BREAST SURGERY     CARDIAC CATHETERIZATION     [phone]   CARDIAC CATHETERIZATION     x 2 stents   CARPAL TUNNEL RELEASE Left    CERVICAL SPINE SURGERY     CHOLECYSTECTOMY  1967   DIAGNOSTIC MAMMOGRAM     dnc     ESOPHAGEAL DILATION N/A 04/09/2022   Procedure: ESOPHAGEAL DILATION;  Surgeon: Dolores Frame, MD;  Location[facility];  Service: Gastroenterology;  Laterality: N/A;   ESOPHAGOGASTRODUODENOSCOPY (EGD) WITH PROPOFOL N/A 04/09/2022   Procedure: ESOPHAGOGASTRODUODENOSCOPY (EGD) WITH PROPOFOL;  Surgeon: Dolores Frame, MD;   Location: [facility];  Service: Gastroenterology;  Laterality: N/A;  2:30pm, asa 3   EYE SURGERY     HERNIA REPAIR     HYSTERECTOMY ABDOMINAL WITH SALPINGO-OOPHORECTOMY  1960   MASTECTOMY Right 2012   SKIN CANCER EXCISION     SPINE SURGERY     THORACIC SPINE SURGERY     Family History  Problem Relation Age of Onset   Heart disease Father    Cancer Brother    Social History   Socioeconomic History   Marital status: Widowed    Spouse name: Not on file   Number  of children: Not on file   Years of education: Not on file   Highest education level: Not on file  Occupational History   Not on file  Tobacco Use   Smoking status: Never    Passive exposure: Never   Smokeless tobacco: Never  Vaping Use   Vaping Use: Never used  Substance and Sexual Activity   Alcohol use: Never   Drug use: Never   Sexual activity: Not Currently    Birth control/protection: None  Other Topics Concern   Not on file  Social History Narrative   Right handed   Social Determinants of Health   Financial Resource Strain: Low Risk  (08/30/2022)   Overall Financial Resource Strain (CARDIA)    Difficulty of Paying Living Expenses: Not very hard  Food Insecurity: Food Insecurity Present (08/30/2022)   Hunger Vital Sign    Worried About Running Out of Food in the Last Year: Sometimes true    Ran Out of Food in the Last Year: Sometimes true  Transportation Needs: Unmet Transportation Needs (08/30/2022)   PRAPARE - Administrator, Civil Service (Medical): Yes    Lack of Transportation (Non-Medical): No  Physical Activity: Inactive (08/30/2022)   Exercise Vital Sign    Days of Exercise per Week: 0 days    Minutes of Exercise per Session: 0 min  Stress: No Stress Concern Present (08/30/2022)   [employer] of Occupational Health - Occupational Stress Questionnaire    Feeling of Stress : Only a little  Social Connections: Moderately Isolated (08/30/2022)   Social Connection and Isolation  Panel [NHANES]    Frequency of Communication with Friends and Family: Twice a week    Frequency of Social Gatherings with Friends and Family: Once a week    Attends Religious Services: More than 4 times per year    Active Member of [employer] or Organizations: No    Attends Banker Meetings: Never    Marital Status: Widowed    Tobacco Counseling Counseling given: Yes   Clinical Intake:  Pre-visit preparation completed: Yes  Pain : 0-10 Pain Score: 6  Pain Type: Chronic pain Pain Location: Leg (and shoulders) Pain Descriptors / Indicators: Constant, Heaviness Pain Onset: More than a month ago     BMI - recorded: 33.83 Nutritional Status: BMI > 30  Obese Nutritional Risks: None Diabetes: Yes CBG done?: No (telehealth visit) Did pt. bring in CBG monitor from home?: No  How often do you need to have someone help you when you read instructions, pamphlets, or other written materials from your doctor or pharmacy?: 1 - Never  Diabetic?yes Nutrition Risk Assessment:  Has the patient had any N/V/D within the last 2 months?  No  Does the patient have any non-healing wounds?  No  Has the patient had any unintentional weight loss or weight gain?  No   Diabetes:  Is the patient diabetic?  Yes  If diabetic, was a CBG obtained today?  No  Did the patient bring in their glucometer from home?  No  How often do you monitor your CBG's? daily.   Financial Strains and Diabetes Management:  Are you having any financial strains with the device, your supplies or your medication? No .  Does the patient want to be seen by Chronic Care Management for management of their diabetes?  No  Would the patient like to be referred to a Nutritionist or for Diabetic Management?  No   Diabetic Exams:  Diabetic Eye Exam:  Overdue for diabetic eye exam. Pt has been advised about the importance in completing this exam. Patient advised to call and schedule an eye exam.Appt in August Diabetic  Foot Exam: Overdue, Pt has been advised about the importance in completing this exam. Pt is scheduled for diabetic foot exam on n/a.   Interpreter Needed?: No  Information entered by :: Abby Adonus Uselman, CMA   Activities of Daily Living    08/30/2022    3:00 PM 08/28/2022    9:31 PM  In your present state of health, do you have any difficulty performing the following activities:  Hearing? 1 1  Vision? 1 1  Difficulty concentrating or making decisions? 1 1  Walking or climbing stairs? 1 1  Dressing or bathing? 1 1  Doing errands, shopping? 1 1  Preparing Food and eating ? Amy Kelly  Comment son helps   Using the Toilet? Y Y  In the past six months, have you accidently leaked urine? Y Y  Do you have problems with loss of bowel control? N N  Managing your Medications? Y Y  Managing your Finances? Amy Kelly  Housekeeping or managing your Housekeeping? Amy Kelly    Patient Care Team: Tommie Sams, DO as PCP - General (Family Medicine)  Indicate any recent Medical Services you may have received from other than [facility] providers in the past year (date may be approximate).     Assessment:   This is a routine wellness examination for Amy Kelly.  Hearing/Vision screen Hearing Screening - Comments:: Patient has difficulties hearing  Vision Screening - Comments:: Patient has an appointment scheduled in August with [facility]  Dietary issues and exercise activities discussed: Current Exercise Habits: The patient does not participate in regular exercise at present, Exercise limited by: orthopedic condition(s)   Goals Addressed               This Visit's Progress     Patient Stated (pt-stated)        Patients goal is to be able to ambulate better and to use her walker less       Depression Screen    08/30/2022    2:52 PM 07/03/2022    2:02 PM  PHQ 2/9 Scores  PHQ - 2 Score 0 0  PHQ- 9 Score 0 5    Fall Risk    08/30/2022    2:59 PM 08/28/2022    9:31 PM 08/07/2022    2:20 PM  07/03/2022    2:01 PM 10/02/2021    2:02 PM  Fall Risk   Falls in the past year? 1 1 0 1 0  Number falls in past yr: 0 0 0 1 0  Injury with Fall? 1 1 0 0 0  Risk for fall due to : History of fall(s);Impaired balance/gait;Impaired mobility;Orthopedic patient   History of fall(s)   Follow up Education provided;Falls prevention discussed  Falls evaluation completed Falls evaluation completed     FALL RISK PREVENTION PERTAINING TO THE HOME:  Any stairs in or around the home? No  If so, are there any without handrails? No  Home free of loose throw rugs in walkways, pet beds, electrical cords, etc? Yes  Adequate lighting in your home to reduce risk of falls? Yes   ASSISTIVE DEVICES UTILIZED TO PREVENT FALLS:  Life alert? No  Use of a cane, walker or w/c? Yes  Grab bars in the bathroom? Yes  Shower chair or bench in shower? Yes  Elevated toilet seat  or a handicapped toilet? No   TIMED UP AND GO:  Was the test performed? No .   Cognitive Function:        08/30/2022    3:01 PM  6CIT Screen  What Year? 0 points  What month? 0 points  What time? 0 points  Count back from 20 0 points  Months in reverse 0 points  Repeat phrase 0 points  Total Score 0 points    Immunizations Immunization History  Administered Date(s) Administered   Moderna Sars-Covid-2 Vaccination 05/21/2019, 06/21/2019, 03/01/2020    TDAP status: Due, Education has been provided regarding the importance of this vaccine. Advised may receive this vaccine at local pharmacy or Health Dept. Aware to provide a copy of the vaccination record if obtained from local pharmacy or Health Dept. Verbalized acceptance and understanding.  Flu Vaccine status: Up to date  Pneumococcal vaccine status: Due, Education has been provided regarding the importance of this vaccine. Advised may receive this vaccine at local pharmacy or Health Dept. Aware to provide a copy of the vaccination record if obtained from local pharmacy or  Health Dept. Verbalized acceptance and understanding.  Covid-19 vaccine status: Information provided on how to obtain vaccines.   Qualifies for Shingles Vaccine? Yes   Zostavax completed No   Shingrix Completed?: No.    Education has been provided regarding the importance of this vaccine. Patient has been advised to call insurance company to determine out of pocket expense if they have not yet received this vaccine. Advised may also receive vaccine at local pharmacy or Health Dept. Verbalized acceptance and understanding.  Screening Tests Health Maintenance  Topic Date Due   FOOT EXAM  Never done   OPHTHALMOLOGY EXAM  Never done   DTaP/Tdap/Td (1 - Tdap) Never done   Zoster Vaccines- Shingrix (1 of 2) Never done   Pneumonia Vaccine 29+ Years old (1 of 1 - PCV) Never done   DEXA SCAN  Never done   HEMOGLOBIN A1C  05/12/2021   COVID-19 Vaccine (4 - 2023-24 season) 11/16/2021   INFLUENZA VACCINE  10/17/2022   Medicare Annual Wellness (AWV)  08/30/2023   HPV VACCINES  Aged Out    Health Maintenance  Health Maintenance Due  Topic Date Due   FOOT EXAM  Never done   OPHTHALMOLOGY EXAM  Never done   DTaP/Tdap/Td (1 - Tdap) Never done   Zoster Vaccines- Shingrix (1 of 2) Never done   Pneumonia Vaccine 32+ Years old (1 of 1 - PCV) Never done   DEXA SCAN  Never done   HEMOGLOBIN A1C  05/12/2021   COVID-19 Vaccine (4 - 2023-24 season) 11/16/2021    Colorectal cancer screening: No longer required.   Mammogram status: No longer required due to age.  Please request records for bone density scan that was completed in March 2023 from Dr. Jonathon Bellows in [place]  Lung Cancer Screening: (Low Dose CT Chest recommended if Age 17-80 years, 30 pack-year currently smoking OR have quit w/in 15years.) does not qualify.   Additional Screening:  Hepatitis C Screening: does not qualify Vision Screening: Recommended annual ophthalmology exams for early detection of glaucoma and other disorders  of the eye. Is the patient up to date with their annual eye exam?  No  Who is the provider or what is the name of the office in which the patient attends annual eye exams? Appt in August with [facility] .   Dental Screening: Recommended annual dental exams for proper  oral hygiene  Community Resource Referral / Chronic Care Management: CRR required this visit?  Yes   CCM required this visit?  No      Plan:     I have personally reviewed and noted the following in the patient's chart:   Medical and social history Use of alcohol, tobacco or illicit drugs  Current medications and supplements including opioid prescriptions. Patient is not currently taking opioid prescriptions. Functional ability and status Nutritional status Physical activity Advanced directives List of other physicians Hospitalizations, surgeries, and ER visits in previous 12 months Vitals Screenings to include cognitive, depression, and falls Referrals and appointments  In addition, I have reviewed and discussed with patient certain preventive protocols, quality metrics, and best practice recommendations. A written personalized care plan for preventive services as well as general preventive health recommendations were provided to patient.   Due to this being a telephonic visit, the after visit summary with patients personalized plan was offered to patient via mail or my-chart. Patient would like to access their AVS via my-chart    Amy Kelly, CMA   08/30/2022   Nurse Notes: Please request bone density result from Dr. Jonathon Bellows in [place]

## 2022-09-05 ENCOUNTER — Other Ambulatory Visit: Payer: Self-pay

## 2022-09-05 ENCOUNTER — Emergency Department (HOSPITAL_COMMUNITY)
Admission: EM | Admit: 2022-09-05 | Discharge: 2022-09-05 | Disposition: A | Discharge status: Home / Self Care | Payer: Medicare Other | Attending: Emergency Medicine | Admitting: Emergency Medicine

## 2022-09-05 DIAGNOSIS — Z79899 Other long term (current) drug therapy: Secondary | ICD-10-CM | POA: Diagnosis not present

## 2022-09-05 DIAGNOSIS — N189 Chronic kidney disease, unspecified: Secondary | ICD-10-CM | POA: Insufficient documentation

## 2022-09-05 DIAGNOSIS — N3000 Acute cystitis without hematuria: Secondary | ICD-10-CM | POA: Diagnosis not present

## 2022-09-05 DIAGNOSIS — Z7902 Long term (current) use of antithrombotics/antiplatelets: Secondary | ICD-10-CM | POA: Insufficient documentation

## 2022-09-05 DIAGNOSIS — N390 Urinary tract infection, site not specified: Secondary | ICD-10-CM | POA: Diagnosis not present

## 2022-09-05 LAB — CBC WITH DIFFERENTIAL/PLATELET
Abs Immature Granulocytes: 0.03 10*3/uL (ref 0.00–0.07)
Basophils Absolute: 0.1 10*3/uL (ref 0.0–0.1)
Basophils Relative: 1 %
Eosinophils Absolute: 0.2 10*3/uL (ref 0.0–0.5)
Eosinophils Relative: 2 %
HCT: 35.5 % — ABNORMAL LOW (ref 36.0–46.0)
Hemoglobin: 11.5 g/dL — ABNORMAL LOW (ref 12.0–15.0)
Immature Granulocytes: 0 %
Lymphocytes Relative: 13 %
Lymphs Abs: 1.1 10*3/uL (ref 0.7–4.0)
MCH: 29.2 pg (ref 26.0–34.0)
MCHC: 32.4 g/dL (ref 30.0–36.0)
MCV: 90.1 fL (ref 80.0–100.0)
Monocytes Absolute: 0.8 10*3/uL (ref 0.1–1.0)
Monocytes Relative: 9 %
Neutro Abs: 6.4 10*3/uL (ref 1.7–7.7)
Neutrophils Relative %: 75 %
Platelets: 194 10*3/uL (ref 150–400)
RBC: 3.94 MIL/uL (ref 3.87–5.11)
RDW: 15.9 % — ABNORMAL HIGH (ref 11.5–15.5)
WBC: 8.5 10*3/uL (ref 4.0–10.5)
nRBC: 0 % (ref 0.0–0.2)

## 2022-09-05 LAB — BASIC METABOLIC PANEL
Anion gap: 11 (ref 5–15)
BUN: 53 mg/dL — ABNORMAL HIGH (ref 8–23)
CO2: 27 mmol/L (ref 22–32)
Calcium: 8.7 mg/dL — ABNORMAL LOW (ref 8.9–10.3)
Chloride: 97 mmol/L — ABNORMAL LOW (ref 98–111)
Creatinine, Ser: 2.12 mg/dL — ABNORMAL HIGH (ref 0.44–1.00)
GFR, Estimated: 22 mL/min — ABNORMAL LOW (ref >60)
Glucose, Bld: 306 mg/dL — ABNORMAL HIGH (ref 70–99)
Potassium: 4.1 mmol/L (ref 3.5–5.1)
Sodium: 135 mmol/L (ref 135–145)

## 2022-09-05 MED ORDER — DOXYCYCLINE HYCLATE 100 MG PO CAPS: 100.0000 mg | ORAL_CAPSULE | Freq: Two times a day (BID) | ORAL | 0 refills | Status: AC

## 2022-09-05 MED ORDER — SODIUM CHLORIDE 0.9 % IV SOLN
1.0000 g | Freq: Once | INTRAVENOUS | Status: AC
  Administered 2022-09-05: 1 g via INTRAVENOUS
  Filled 2022-09-05: qty 10

## 2022-09-05 MED ORDER — SODIUM CHLORIDE 0.9 % IV BOLUS
500.0000 mL | Freq: Once | INTRAVENOUS | Status: AC
  Administered 2022-09-05: 500 mL via INTRAVENOUS

## 2022-09-05 MED ORDER — DOXYCYCLINE HYCLATE 100 MG PO TABS
100.0000 mg | ORAL_TABLET | Freq: Once | ORAL | Status: AC
  Administered 2022-09-05: 100 mg via ORAL
  Filled 2022-09-05: qty 1

## 2022-09-05 NOTE — ED Provider Notes (Signed)
[place] EMERGENCY DEPARTMENT AT [facility] Provider Note   CSN: [id number] Arrival date & time: 09/05/22  1556     History  Chief Complaint  Patient presents with   Urinary Tract Infection    Amy Kelly is a 89 y.o. female.  She has history of CKD, presents to the ER for positive urine culture was taken several days ago by nephrology.  They called her with results and advised to be evaluated in the ED.  She is having no vomiting, no fevers.    Urinary Tract Infection      Home Medications Prior to Admission medications   Medication Sig Start Date End Date Taking? Authorizing Provider  doxycycline (VIBRAMYCIN) 100 MG capsule Take 1 capsule (100 mg total) by mouth 2 (two) times daily for 7 days. 09/05/22 09/12/22 Yes Hayze Gazda A, PA-C  amLODipine (NORVASC) 5 MG tablet Take 1 tablet (5 mg total) by mouth daily. 07/03/22   Tommie Sams, DO  Ascorbic Acid (VITAMIN C GUMMIE PO) Take 1,000 mg by mouth in the morning.    [provider]  B Complex-C (B-COMPLEX WITH VITAMIN C) tablet Take 1 tablet by mouth in the morning.    [provider]  Blood Glucose Monitoring Suppl DEVI Use up to 4 times daily to check blood sugar. May substitute to any manufacturer covered by patient's insurance. 07/07/22   Tommie Sams, DO  calcitRIOL (ROCALTROL) 0.25 MCG capsule Take 0.25 mcg by mouth every Monday, Wednesday, and Friday.    [provider]  Calcium-Phosphorus-Vitamin D (CALCIUM GUMMIES PO) Take 2 tablets by mouth in the morning.    [provider]  Cholecalciferol (VITAMIN D3) 125 MCG (5000 UT) TABS Take 5,000 Units by mouth in the morning.    [provider]  clobetasol (TEMOVATE) 0.05 % external solution Apply 1 Application topically in the morning. Applied to the back of the neck 12/03/21   [provider]  clopidogrel (PLAVIX) 75 MG tablet Take 75 mg by mouth in the morning. 04/10/20   [provider]  doxazosin  (CARDURA) 2 MG tablet Take 2 mg by mouth at bedtime. 12/27/20 04/01/24  [provider]  escitalopram (LEXAPRO) 10 MG tablet Take 10 mg by mouth in the morning. 10/16/20   [provider]  famotidine (PEPCID) 20 MG tablet TAKE 1 TABLET BY MOUTH AT BEDTIME 07/29/22   Dolores Frame, MD  folic acid (FOLVITE) 800 MCG tablet Take 800 mcg by mouth in the morning.    [provider]  furosemide (LASIX) 20 MG tablet Take 20 mg by mouth in the morning. 20 mg + 40 mg=60 mg    [provider]  furosemide (LASIX) 40 MG tablet Take 1 tablet (40 mg total) by mouth every morning. For fluid Patient taking differently: Take 40 mg by mouth every morning. 40 mg + 20 mg= 60 mg 11/11/20   Shon Hale, MD  gabapentin (NEURONTIN) 100 MG capsule Take 1 capsule (100 mg total) by mouth 3 (three) times daily. Patient taking differently: Take 100 mg by mouth in the morning and at bedtime. 10/02/21   Everlena Cooper, Adam R, DO  Glucose Blood (BLOOD GLUCOSE TEST STRIPS) STRP Use up to 4 times daily to check blood sugar. May substitute to any manufacturer covered by patient's insurance. 07/07/22   Tommie Sams, DO  insulin aspart (NOVOLOG) 100 UNIT/ML FlexPen Inject 1-5 Units into the skin every evening. Dose per sliding scale 04/10/20   [provider]  insulin glargine (LANTUS SOLOSTAR) 100 UNIT/ML Solostar Pen Inject 23 Units into the skin daily. 05/02/20   [provider]  levothyroxine (SYNTHROID) 50 MCG tablet Take 50 mcg by mouth daily before breakfast. 11/08/20   [provider]  loratadine (CLARITIN) 10 MG tablet Take 10 mg by mouth in the morning.    [provider]  Multiple Vitamin (MULTIVITAMIN WITH MINERALS) TABS tablet Take 1 tablet by mouth in the morning.    [provider]  Olopatadine HCl (PATADAY OP) Place 1 drop into both eyes in the morning. Once in the mornings in left eye    [provider]  omeprazole (PRILOSEC) 40 MG  capsule Take 40 mg by mouth daily before breakfast.    [provider]  polyethylene glycol (MIRALAX / GLYCOLAX) 17 g packet Take 17 g by mouth every other day.    [provider]  rOPINIRole (REQUIP) 2 MG tablet Take 1 tablet (2 mg total) by mouth at bedtime. 07/03/22   Tommie Sams, DO  rosuvastatin (CRESTOR) 40 MG tablet Take 40 mg by mouth at bedtime. 05/12/20   [provider]  Vibegron (GEMTESA) 75 MG TABS Take 75 mg by mouth in the morning.    [provider]  vitamin B-12 (CYANOCOBALAMIN) 500 MCG tablet Take 1,000 mcg by mouth in the morning.    [provider]      Allergies    Fesoterodine, Penicillins, Prednisone, Solifenacin, and Sulfa antibiotics    Review of Systems   Review of Systems  Physical Exam Updated Vital Signs BP (!) 141/63   Pulse 68   Temp 98.5 F (36.9 C)   Resp 20   Ht 5\' 3"  (1.6 m)   Wt 86.6 kg   SpO2 93%   BMI 33.83 kg/m  Physical Exam Vitals and nursing note reviewed.  Constitutional:      General: She is not in acute distress.    Appearance: She is well-developed.  HENT:     Head: Normocephalic and atraumatic.  Eyes:     Conjunctiva/sclera: Conjunctivae normal.  Cardiovascular:     Rate and Rhythm: Normal rate and regular rhythm.     Heart sounds: No murmur heard. Pulmonary:     Effort: Pulmonary effort is normal. No respiratory distress.     Breath sounds: Normal breath sounds.  Abdominal:     Palpations: Abdomen is soft.     Tenderness: There is abdominal tenderness in the suprapubic area. There is no right CVA tenderness, left CVA tenderness, guarding or rebound.  Musculoskeletal:        General: No swelling.     Cervical back: Neck supple.  Skin:    General: Skin is warm and dry.     Capillary Refill: Capillary refill takes less than 2 seconds.  Neurological:     General: No focal deficit present.     Mental Status: She is alert and oriented to person, place, and time.  Psychiatric:         Mood and Affect: Mood normal.     ED Results / Procedures / Treatments   Labs (all labs ordered are listed, but only abnormal results are displayed) Labs Reviewed  CBC WITH DIFFERENTIAL/PLATELET - Abnormal; Notable for the following components:      Result Value   Hemoglobin 11.5 (*)    HCT 35.5 (*)    RDW 15.9 (*)    All other components within normal limits  BASIC METABOLIC PANEL -  Abnormal; Notable for the following components:   Chloride 97 (*)    Glucose, Bld 306 (*)    BUN 53 (*)    Creatinine, Ser 2.12 (*)    Calcium 8.7 (*)    GFR, Estimated 22 (*)    All other components within normal limits  URINE CULTURE    EKG None  Radiology No results found.  Procedures Procedures    Medications Ordered in ED Medications  cefTRIAXone (ROCEPHIN) 1 g in sodium chloride 0.9 % 100 mL IVPB (1 g Intravenous New Bag/Given 09/05/22 1947)  doxycycline (VIBRA-TABS) tablet 100 mg (100 mg Oral Given 09/05/22 2001)  sodium chloride 0.9 % bolus 500 mL (500 mLs Intravenous New Bag/Given 09/05/22 1947)    ED Course/ Medical Decision Making/ A&P Clinical Course as of 09/05/22 2014  Thu Sep 05, 2022  2013 Patient was admitted for UTI, had positive culture from nephrologist presents to be evaluated in the ED for complicated UTI.  No fevers or flank pain, no nausea or vomiting.  She does have dysuria and mild suprapubic tenderness on exam.  Her vitals and labs are reassuring.  Renal function is at her baseline.  Culture shows Klebsiella pneumonia and MRSA.  They are both sensitive to tetracycline on the culture.  Given doxycycline and Rocephin in the ED, will send home on doxycycline and advised on close follow-up and strict return precautions.  Given IV fluids for her hyperglycemia, vies to take her insulin at home as prescribed. [CB]    Clinical Course User Index [CB] Ma Rings, PA-C                             Medical Decision Making Ddx: UTI, pyelonephritis, other ED  course: Is having dysuria, had urine culture as outpatient that showed Klebsiella pneumonia and MRSA.  She comes with her culture consultant reports, both are sensitive to tetracyclines.  Will prescribe doxycycline.  Patient is well-appearing, no leukocytosis, no AKI, she is hyperglycemic, given fluids for this and encouraged take insulin at home.  She wants to go home.  She is here with her son who is agreeable with taking her home as well.  Advised on follow-up and strict return precautions.  Amount and/or Complexity of Data Reviewed Labs: ordered.  Risk Prescription drug management.           Final Clinical Impression(s) / ED Diagnoses Final diagnoses:  Acute cystitis without hematuria    Rx / DC Orders ED Discharge Orders          Ordered    doxycycline (VIBRAMYCIN) 100 MG capsule  2 times daily        09/05/22 2008              Josem Kaufmann 09/05/22 2131    Bethann Berkshire, MD 09/06/22 1108

## 2022-09-05 NOTE — ED Notes (Signed)
Pt ambulated to restroom with assistance, pt's son states they forgot to get urine specimen. PA aware.

## 2022-09-05 NOTE — ED Triage Notes (Signed)
Pt sent from the nephrology for a UTI with klebsiella pneumoniae and MRSA.  Pt has C&S reports with her from doctor's office.

## 2022-09-05 NOTE — Discharge Instructions (Addendum)
You are seen today for urinary tract infection.  We were able to view your culture results that you brought with you.  The antibiotic you are prescribed should cover the bacteria that grew in your culture.  Follow-up closely with your primary care doctor.  If you have any new or worsening symptoms, come back to the ER especially if you have fever, worsening pain, vomiting or other worrisome changes.  Blood sugar was elevated today as well.  You were given IV fluids to help lower this make sure you are taking your insulin as prescribed at home.

## 2022-09-12 ENCOUNTER — Telehealth: Payer: Self-pay | Admitting: *Deleted

## 2022-09-12 NOTE — Telephone Encounter (Signed)
Transition Care Management Unsuccessful Follow-up Telephone Call  Date of discharge and from where:  [place]  09/05/2022  Attempts:  1st Attempt  Reason for unsuccessful TCM follow-up call:  Left voice message

## 2022-09-16 ENCOUNTER — Other Ambulatory Visit: Payer: Self-pay

## 2022-09-16 ENCOUNTER — Ambulatory Visit (INDEPENDENT_AMBULATORY_CARE_PROVIDER_SITE_OTHER): Payer: Medicare Other | Admitting: Family Medicine

## 2022-09-16 VITALS — BP 128/72 | HR 62 | Ht 63.0 in | Wt 196.4 lb

## 2022-09-16 DIAGNOSIS — E1169 Type 2 diabetes mellitus with other specified complication: Secondary | ICD-10-CM

## 2022-09-16 DIAGNOSIS — E785 Hyperlipidemia, unspecified: Secondary | ICD-10-CM | POA: Diagnosis not present

## 2022-09-16 DIAGNOSIS — R300 Dysuria: Secondary | ICD-10-CM | POA: Diagnosis not present

## 2022-09-16 DIAGNOSIS — Z794 Long term (current) use of insulin: Secondary | ICD-10-CM

## 2022-09-16 DIAGNOSIS — I10 Essential (primary) hypertension: Secondary | ICD-10-CM | POA: Diagnosis not present

## 2022-09-16 DIAGNOSIS — N3000 Acute cystitis without hematuria: Secondary | ICD-10-CM | POA: Diagnosis not present

## 2022-09-16 DIAGNOSIS — E1165 Type 2 diabetes mellitus with hyperglycemia: Secondary | ICD-10-CM

## 2022-09-16 DIAGNOSIS — N390 Urinary tract infection, site not specified: Secondary | ICD-10-CM | POA: Insufficient documentation

## 2022-09-16 LAB — POCT URINALYSIS DIP (CLINITEK)
Bilirubin, UA: NEGATIVE
Blood, UA: NEGATIVE
Glucose, UA: NEGATIVE mg/dL
Ketones, POC UA: NEGATIVE mg/dL
Leukocytes, UA: NEGATIVE
Nitrite, UA: NEGATIVE
Spec Grav, UA: 1.02 (ref 1.010–1.025)
Urobilinogen, UA: 0.2 E.U./dL
pH, UA: 6 (ref 5.0–8.0)

## 2022-09-16 NOTE — Patient Instructions (Signed)
Labs today.   Follow up in 3 months.  

## 2022-09-16 NOTE — Assessment & Plan Note (Signed)
Remains uncontrolled.  Continue current insulin regimen.  Awaiting A1c.

## 2022-09-16 NOTE — Assessment & Plan Note (Signed)
Stable.  Continue amlodipine and Lasix. ?

## 2022-09-16 NOTE — Assessment & Plan Note (Signed)
Completed antibiotic course.  Urine clear today.  Sending for culture to ensure complete resolution.

## 2022-09-16 NOTE — Progress Notes (Signed)
Subjective:  Patient ID: Amy Kelly, female    DOB: 1928-04-29  Age: 89 y.o. MRN: [id number]  CC: Chief Complaint  Patient presents with   Follow-up   Urinary Tract Infection    PT stated she has Mid left back pain    HPI:  89 year old female with the below mentioned medical problems presents for follow-up.  Recent UTI.  Grew MRSA.  Treated with doxycycline.  Urinary symptoms have resolved.  Repeating urinalysis today.  Son brings in her log of her blood sugar readings.  Fair control in the morning but then worsens as the day goes on.  Likely exacerbated by dietary choices.  Needs A1c.  Son says that she has a eye exam coming up soon; within the next month.  Hypertension stable on amlodipine and Lasix.  Patient Active Problem List   Diagnosis Date Noted   Urinary tract infection 09/16/2022   Uncontrolled type 2 diabetes mellitus with hyperglycemia (HCC) 07/07/2022   RLS (restless legs syndrome) 07/07/2022   Chronic diastolic heart failure (HCC) 07/03/2022   Constipation 02/24/2022   Dysphagia 02/24/2022   Urge incontinence 09/10/2021   CKD (chronic kidney disease), stage IV (HCC) 11/09/2020   HTN (hypertension) 11/08/2020   Hypothyroidism 11/08/2020   Anxiety 11/08/2020   Stroke (HCC) 11/08/2020   Anemia 11/08/2020    Social Hx   Social History   Socioeconomic History   Marital status: Widowed    Spouse name: Not on file   Number of children: Not on file   Years of education: Not on file   Highest education level: Not on file  Occupational History   Not on file  Tobacco Use   Smoking status: Never    Passive exposure: Never   Smokeless tobacco: Never  Vaping Use   Vaping Use: Never used  Substance and Sexual Activity   Alcohol use: Never   Drug use: Never   Sexual activity: Not Currently    Birth control/protection: None  Other Topics Concern   Not on file  Social History Narrative   Right handed   Social Determinants of Health   Financial  Resource Strain: Low Risk  (08/30/2022)   Overall Financial Resource Strain (CARDIA)    Difficulty of Paying Living Expenses: Not very hard  Food Insecurity: Food Insecurity Present (08/30/2022)   Hunger Vital Sign    Worried About Running Out of Food in the Last Year: Sometimes true    Ran Out of Food in the Last Year: Sometimes true  Transportation Needs: Unmet Transportation Needs (08/30/2022)   PRAPARE - Administrator, Civil Service (Medical): Yes    Lack of Transportation (Non-Medical): No  Physical Activity: Inactive (08/30/2022)   Exercise Vital Sign    Days of Exercise per Week: 0 days    Minutes of Exercise per Session: 0 min  Stress: No Stress Concern Present (08/30/2022)   [employer] of Occupational Health - Occupational Stress Questionnaire    Feeling of Stress : Only a little  Social Connections: Moderately Isolated (08/30/2022)   Social Connection and Isolation Panel [NHANES]    Frequency of Communication with Friends and Family: Twice a week    Frequency of Social Gatherings with Friends and Family: Once a week    Attends Religious Services: More than 4 times per year    Active Member of [employer] or Organizations: No    Attends Banker Meetings: Never    Marital Status: Widowed    Review of Systems  Constitutional: Negative.   Musculoskeletal:        Pain in her lower legs.   Objective:  BP 128/72   Pulse 62   Ht 5\' 3"  (1.6 m)   Wt 196 lb 6.4 oz (89.1 kg)   SpO2 93%   BMI 34.79 kg/m      09/16/2022   11:08 AM 09/05/2022    7:47 PM 09/05/2022    4:20 PM  BP/Weight  Systolic BP 128 138 141  Diastolic BP 72 62 63  Wt. (Lbs) 196.4    BMI 34.79 kg/m2      Physical Exam Vitals and nursing note reviewed.  Constitutional:      Appearance: Normal appearance. She is obese.  HENT:     Head: Normocephalic and atraumatic.  Eyes:     General:        Right eye: No discharge.        Left eye: No discharge.     Conjunctiva/sclera:  Conjunctivae normal.  Cardiovascular:     Rate and Rhythm: Normal rate and regular rhythm.     Heart sounds: Murmur heard.  Abdominal:     General: There is no distension.     Palpations: Abdomen is soft.     Tenderness: There is no abdominal tenderness.  Neurological:     Mental Status: She is alert.  Psychiatric:        Mood and Affect: Mood normal.        Behavior: Behavior normal.     Lab Results  Component Value Date   WBC 8.5 09/05/2022   HGB 11.5 (L) 09/05/2022   HCT 35.5 (L) 09/05/2022   PLT 194 09/05/2022   GLUCOSE 306 (H) 09/05/2022   NA 135 09/05/2022   K 4.1 09/05/2022   CL 97 (L) 09/05/2022   CREATININE 2.12 (H) 09/05/2022   BUN 53 (H) 09/05/2022   CO2 27 09/05/2022   TSH 4.565 (H) 11/08/2020   HGBA1C 7.0 (H) 11/09/2020     Assessment & Plan:   Problem List Items Addressed This Visit       Cardiovascular and Mediastinum   HTN (hypertension)    Stable.  Continue amlodipine and Lasix.        Endocrine   Uncontrolled type 2 diabetes mellitus with hyperglycemia (HCC) - Primary    Remains uncontrolled.  Continue current insulin regimen.  Awaiting A1c.      Relevant Orders   Hemoglobin A1c   Microalbumin / creatinine urine ratio     Genitourinary   Urinary tract infection    Completed antibiotic course.  Urine clear today.  Sending for culture to ensure complete resolution.      Relevant Orders   POCT URINALYSIS DIP (CLINITEK) (Completed)   Urine Culture   Other Visit Diagnoses     Hyperlipidemia associated with type 2 diabetes mellitus (HCC)       Relevant Orders   Lipid panel      Follow-up:  Return in about 3 months (around 12/17/2022).  Everlene Other DO [facility]

## 2022-09-17 DIAGNOSIS — E1165 Type 2 diabetes mellitus with hyperglycemia: Secondary | ICD-10-CM | POA: Diagnosis not present

## 2022-09-17 LAB — MICROALBUMIN / CREATININE URINE RATIO: Microalbumin, Urine: 19.6 ug/mL

## 2022-09-18 LAB — MICROALBUMIN / CREATININE URINE RATIO
Creatinine, Urine: 33.9 mg/dL
Creatinine, Urine: 47 mg/dL
Microalb/Creat Ratio: 58 mg/g{creat} — ABNORMAL HIGH (ref 0–29)
Microalb/Creat Ratio: 56 mg/g creat — ABNORMAL HIGH (ref 0–29)
Microalbumin, Urine: 26.5 ug/mL

## 2022-09-18 LAB — URINE CULTURE: Organism ID, Bacteria: NO GROWTH

## 2022-09-20 ENCOUNTER — Encounter: Payer: Self-pay | Admitting: *Deleted

## 2022-09-20 LAB — LIPID PANEL
Chol/HDL Ratio: 3 ratio (ref 0.0–4.4)
Cholesterol, Total: 108 mg/dL (ref 100–199)
HDL: 36 mg/dL — ABNORMAL LOW (ref >39)
LDL Chol Calc (NIH): 45 mg/dL (ref 0–99)
Triglycerides: 156 mg/dL — ABNORMAL HIGH (ref 0–149)
VLDL Cholesterol Cal: 27 mg/dL (ref 5–40)

## 2022-09-20 LAB — SPECIMEN STATUS REPORT

## 2022-09-20 LAB — HEMOGLOBIN A1C
Est. average glucose Bld gHb Est-mCnc: 214 mg/dL
Hgb A1c MFr Bld: 9.1 % — ABNORMAL HIGH (ref 4.8–5.6)

## 2022-09-20 LAB — MICROALBUMIN / CREATININE URINE RATIO

## 2022-09-30 DIAGNOSIS — I739 Peripheral vascular disease, unspecified: Secondary | ICD-10-CM | POA: Diagnosis not present

## 2022-09-30 DIAGNOSIS — M79671 Pain in right foot: Secondary | ICD-10-CM | POA: Diagnosis not present

## 2022-09-30 DIAGNOSIS — M79672 Pain in left foot: Secondary | ICD-10-CM | POA: Diagnosis not present

## 2022-09-30 DIAGNOSIS — E1140 Type 2 diabetes mellitus with diabetic neuropathy, unspecified: Secondary | ICD-10-CM | POA: Diagnosis not present

## 2022-10-02 ENCOUNTER — Other Ambulatory Visit: Payer: Self-pay

## 2022-10-04 ENCOUNTER — Ambulatory Visit: Payer: Medicare Other | Admitting: Family Medicine

## 2022-10-07 ENCOUNTER — Encounter: Payer: Self-pay | Admitting: Urology

## 2022-10-07 ENCOUNTER — Ambulatory Visit: Payer: Medicare Other | Admitting: Urology

## 2022-10-07 VITALS — BP 168/75 | HR 76 | Ht 63.0 in | Wt 196.4 lb

## 2022-10-07 DIAGNOSIS — N3941 Urge incontinence: Secondary | ICD-10-CM

## 2022-10-07 DIAGNOSIS — Z87440 Personal history of urinary (tract) infections: Secondary | ICD-10-CM | POA: Diagnosis not present

## 2022-10-07 NOTE — Patient Instructions (Signed)

## 2022-10-07 NOTE — Progress Notes (Signed)
10/07/2022 3:47 PM   Amy Kelly Mar 22, 1928 [id number]  Referring provider: Jonathon Bellows, DO [address] [place],  [place] [address]  Followup frequent UTI and OAB   HPI: Amy Kelly is a 89yo here for followup for frequent UTI and OAB. She has had 2 UTIs since last visit. She is on gemtesa 75mg  which significantly improved her incontinence. She uses 1-2 pads per day. No straining to urinate. No nocturia. No other complaints today   PMH: Past Medical History:  Diagnosis Date   Allergy    Anxiety and depression    Arthritis    At risk for falls    Cancer [facility])    Cataract    CHF (congestive heart failure) (HCC)    Chronic kidney disease    Coronary artery disease    Diabetes mellitus (HCC) 11/08/2020   Diabetes mellitus without complication (HCC)    GERD (gastroesophageal reflux disease)    History of skin cancer    Right shin   HOH (hard of hearing)    Hyperlipidemia    Hypertension    Hypothyroidism    Neuromuscular disorder (HCC)    Osteoporosis    Oxygen deficiency    Stroke [facility])    Ulcer     Surgical History: Past Surgical History:  Procedure Laterality Date   ABDOMINAL HYSTERECTOMY     BREAST SURGERY     CARDIAC CATHETERIZATION     [id number]   CARDIAC CATHETERIZATION     x 2 stents   CARPAL TUNNEL RELEASE Left    CERVICAL SPINE SURGERY     CHOLECYSTECTOMY  1967   DIAGNOSTIC MAMMOGRAM     dnc     ESOPHAGEAL DILATION N/A 04/09/2022   Procedure: ESOPHAGEAL DILATION;  Surgeon: Dolores Frame, MD;  [facility]: [facility];  Service: Gastroenterology;  Laterality: N/A;   ESOPHAGOGASTRODUODENOSCOPY (EGD) WITH PROPOFOL N/A 04/09/2022   Procedure: ESOPHAGOGASTRODUODENOSCOPY (EGD) WITH PROPOFOL;  Surgeon: Dolores Frame, MD;  Location: [facility];  Service: Gastroenterology;  Laterality: N/A;  2:30pm, asa 3   EYE SURGERY     HERNIA REPAIR     HYSTERECTOMY ABDOMINAL WITH SALPINGO-OOPHORECTOMY  1960   MASTECTOMY Right 2012   SKIN  CANCER EXCISION     SPINE SURGERY     THORACIC SPINE SURGERY      Home Medications:  Allergies as of 10/07/2022       Reactions   Fesoterodine    Severe dry mouth   Penicillins Diarrhea, Nausea Only   Prednisone Diarrhea, Nausea Only   Solifenacin    Severe dry mouth   Sulfa Antibiotics Diarrhea, Nausea Only        Medication List        Accurate as of October 07, 2022  3:47 PM. If you have any questions, ask your nurse or doctor.          amLODipine 5 MG tablet Commonly known as: NORVASC Take 1 tablet (5 mg total) by mouth daily.   B-complex with vitamin C tablet Take 1 tablet by mouth in the morning.   Blood Glucose Monitoring Suppl Devi Use up to 4 times daily to check blood sugar. May substitute to any manufacturer covered by patient's insurance.   BLOOD GLUCOSE TEST STRIPS Strp Use up to 4 times daily to check blood sugar. May substitute to any manufacturer covered by patient's insurance.   calcitRIOL 0.25 MCG capsule Commonly known as: ROCALTROL Take 0.25 mcg by mouth every Monday, Wednesday, and Friday.  CALCIUM GUMMIES PO Take 2 tablets by mouth in the morning.   clobetasol 0.05 % external solution Commonly known as: TEMOVATE Apply 1 Application topically in the morning. Applied to the back of the neck   clopidogrel 75 MG tablet Commonly known as: PLAVIX Take 75 mg by mouth in the morning.   cyanocobalamin 500 MCG tablet Commonly known as: VITAMIN B12 Take 1,000 mcg by mouth in the morning.   doxazosin 2 MG tablet Commonly known as: CARDURA Take 2 mg by mouth at bedtime.   escitalopram 10 MG tablet Commonly known as: LEXAPRO Take 10 mg by mouth in the morning.   famotidine 20 MG tablet Commonly known as: PEPCID TAKE 1 TABLET BY MOUTH AT BEDTIME   folic acid 800 MCG tablet Commonly known as: FOLVITE Take 800 mcg by mouth in the morning.   furosemide 20 MG tablet Commonly known as: LASIX Take 20 mg by mouth in the morning. 20 mg + 40  mg=60 mg What changed: Another medication with the same name was changed. Make sure you understand how and when to take each.   furosemide 40 MG tablet Commonly known as: LASIX Take 1 tablet (40 mg total) by mouth every morning. For fluid What changed: additional instructions   gabapentin 100 MG capsule Commonly known as: NEURONTIN Take 1 capsule (100 mg total) by mouth 3 (three) times daily. What changed: when to take this   Gemtesa 75 MG Tabs Generic drug: Vibegron Take 75 mg by mouth in the morning.   insulin aspart 100 UNIT/ML FlexPen Commonly known as: NOVOLOG Inject 1-5 Units into the skin every evening. Dose per sliding scale   Lantus SoloStar 100 UNIT/ML Solostar Pen Generic drug: insulin glargine Inject 23 Units into the skin daily.   levothyroxine 50 MCG tablet Commonly known as: SYNTHROID Take 50 mcg by mouth daily before breakfast.   loratadine 10 MG tablet Commonly known as: CLARITIN Take 10 mg by mouth in the morning.   multivitamin with minerals Tabs tablet Take 1 tablet by mouth in the morning.   omeprazole 40 MG capsule Commonly known as: PRILOSEC Take 40 mg by mouth daily before breakfast.   PATADAY OP Place 1 drop into both eyes in the morning. Once in the mornings in left eye   polyethylene glycol 17 g packet Commonly known as: MIRALAX / GLYCOLAX Take 17 g by mouth every other day.   rOPINIRole 2 MG tablet Commonly known as: REQUIP Take 1 tablet (2 mg total) by mouth at bedtime.   rosuvastatin 40 MG tablet Commonly known as: CRESTOR Take 40 mg by mouth at bedtime.   VITAMIN C GUMMIE PO Take 1,000 mg by mouth in the morning.   Vitamin D3 125 MCG (5000 UT) Tabs Take 5,000 Units by mouth in the morning.        Allergies:  Allergies  Allergen Reactions   Fesoterodine     Severe dry mouth   Penicillins Diarrhea and Nausea Only   Prednisone Diarrhea and Nausea Only   Solifenacin     Severe dry mouth   Sulfa Antibiotics Diarrhea  and Nausea Only    Family History: Family History  Problem Relation Age of Onset   Heart disease Father    Cancer Brother     Social History:  reports that she has never smoked. She has never been exposed to tobacco smoke. She has never used smokeless tobacco. She reports that she does not drink alcohol and does not use drugs.  ROS: All other review of systems were reviewed and are negative except what is noted above in HPI  Physical Exam: BP (!) 168/75   Pulse 76   Ht 5\' 3"  (1.6 m)   Wt 196 lb 6.4 oz (89.1 kg)   BMI 34.79 kg/m   Constitutional:  Alert and oriented, No acute distress. HEENT: [place] AT, moist mucus membranes.  Trachea midline, no masses. Cardiovascular: No clubbing, cyanosis, or edema. Respiratory: Normal respiratory effort, no increased work of breathing. GI: Abdomen is soft, nontender, nondistended, no abdominal masses GU: No CVA tenderness.  Lymph: No cervical or inguinal lymphadenopathy. Skin: No rashes, bruises or suspicious lesions. Neurologic: Grossly intact, no focal deficits, moving all 4 extremities. Psychiatric: Normal mood and affect.  Laboratory Data: Lab Results  Component Value Date   WBC 8.5 09/05/2022   HGB 11.5 (L) 09/05/2022   HCT 35.5 (L) 09/05/2022   MCV 90.1 09/05/2022   PLT 194 09/05/2022    Lab Results  Component Value Date   CREATININE 2.12 (H) 09/05/2022    No results found for: "PSA"  No results found for: "TESTOSTERONE"  Lab Results  Component Value Date   HGBA1C 9.1 (H) 09/16/2022    Urinalysis    Component Value Date/Time   COLORURINE YELLOW 01/28/2022 1601   APPEARANCEUR CLOUDY (A) 01/28/2022 1601   APPEARANCEUR Clear 10/08/2021 1729   LABSPEC 1.005 01/28/2022 1601   PHURINE 5.0 01/28/2022 1601   GLUCOSEU NEGATIVE 01/28/2022 1601   HGBUR LARGE (A) 01/28/2022 1601   BILIRUBINUR negative 09/16/2022 1139   BILIRUBINUR Negative 10/08/2021 1729   KETONESUR negative 09/16/2022 1139   KETONESUR NEGATIVE  01/28/2022 1601   PROTEINUR 100 (A) 01/28/2022 1601   UROBILINOGEN 0.2 09/16/2022 1139   NITRITE Negative 09/16/2022 1139   NITRITE NEGATIVE 01/28/2022 1601   LEUKOCYTESUR Negative 09/16/2022 1139   LEUKOCYTESUR LARGE (A) 01/28/2022 1601    Lab Results  Component Value Date   LABMICR 26.5 09/17/2022   WBCUA 0-5 10/08/2021   LABEPIT 0-10 10/08/2021   MUCUS Present 10/08/2021   BACTERIA MANY (A) 01/28/2022    Pertinent Imaging:  No results found for this or any previous visit.  Results for orders placed during the hospital encounter of 11/08/20  US Venous Img Lower Bilateral (DVT)  Narrative CLINICAL DATA:  Bilateral calf edema and pain  EXAM: BILATERAL LOWER EXTREMITY VENOUS DOPPLER ULTRASOUND  TECHNIQUE: Gray-scale sonography with graded compression, as well as color Doppler and duplex ultrasound were performed to evaluate the lower extremity deep venous systems from the level of the common femoral vein and including the common femoral, femoral, profunda femoral, popliteal and calf veins including the posterior tibial, peroneal and gastrocnemius veins when visible. The superficial great saphenous vein was also interrogated. Spectral Doppler was utilized to evaluate flow at rest and with distal augmentation maneuvers in the common femoral, femoral and popliteal veins.  COMPARISON:  None.  FINDINGS: RIGHT LOWER EXTREMITY  Common Femoral Vein: No evidence of thrombus. Normal compressibility, respiratory phasicity and response to augmentation.  Saphenofemoral Junction: No evidence of thrombus. Normal compressibility and flow on color Doppler imaging.  Profunda Femoral Vein: No evidence of thrombus. Normal compressibility and flow on color Doppler imaging.  Femoral Vein: No evidence of thrombus. Normal compressibility, respiratory phasicity and response to augmentation.  Popliteal Vein: No evidence of thrombus. Normal compressibility, respiratory phasicity and  response to augmentation.  Calf Veins: Limited visualization of the calf veins because of body habitus and peripheral edema. No gross thrombus appreciated.  Superficial Great Saphenous Vein: No evidence of thrombus. Normal compressibility.  LEFT LOWER EXTREMITY  Common Femoral Vein: No evidence of thrombus. Normal compressibility, respiratory phasicity and response to augmentation.  Saphenofemoral Junction: No evidence of thrombus. Normal compressibility and flow on color Doppler imaging.  Profunda Femoral Vein: No evidence of thrombus. Normal compressibility and flow on color Doppler imaging.  Femoral Vein: No evidence of thrombus. Normal compressibility, respiratory phasicity and response to augmentation.  Popliteal Vein: No evidence of thrombus. Normal compressibility, respiratory phasicity and response to augmentation.  Calf Veins: Limited visualization of the calf veins because of body habitus and edema. No gross thrombus appreciated.  Superficial Great Saphenous Vein: No evidence of thrombus. Normal compressibility.  Other Findings: Complex septated left popliteal fossa Baker's cyst measures 3.6 x 2.4 x 3.5 cm  IMPRESSION: Limited exam because of body habitus and peripheral edema.  No significant acute occlusive DVT in either extremity.  3.6 cm left popliteal fossa complex Baker's cyst.   Electronically Signed By: Judie Petit.  Shick M.D. On: 11/11/2020 07:22  No results found for this or any previous visit.  No results found for this or any previous visit.  Results for orders placed during the hospital encounter of 07/24/21  US RENAL  Narrative CLINICAL DATA:  Acute renal failure with tubular necrosis  Type 2 diabetes  EXAM: RENAL / URINARY TRACT ULTRASOUND COMPLETE  COMPARISON:  07/02/2021  FINDINGS: Right Kidney:  Renal measurements: 10.5 x 4.7 x 5.6 cm = volume: 142 mL. Diffusely thin echogenic cortex. No hydronephrosis. Multiple simple cysts  are seen measuring up to 2 cm.  Left Kidney:  Renal measurements: 10.1 x 4.9 x 4.9 cm = volume: 127 mL. Diffusely thinned echogenic cortex. Multiple simple cysts are present measuring up to 1.3 cm.  Bladder:  Appears normal for degree of bladder distention.  Other:  None.  IMPRESSION: Thinned echogenic renal cortices consistent with chronic medical renal disease.   Electronically Signed By: Acquanetta Belling M.D. On: 07/25/2021 14:21  No valid procedures specified. No results found for this or any previous visit.  No results found for this or any previous visit.   Assessment & Plan:    1. History of UTI -We discussed the natural hx of recurrent UTIs and the various causes. We discussed the treatment options including post coital prophylaxis, daily prophylaxis, topical estrogen therapy. After discussing the options the patient elects for observation - Urinalysis, Routine w reflex microscopic  2. Urge incontinence Continue gemtesa 75mg  daily   No follow-ups on file.  Wilkie Aye, MD  [facility] [place]

## 2022-10-08 LAB — URINALYSIS, ROUTINE W REFLEX MICROSCOPIC
Bilirubin, UA: NEGATIVE
Glucose, UA: NEGATIVE
Ketones, UA: NEGATIVE
Leukocytes,UA: NEGATIVE
Nitrite, UA: NEGATIVE
Protein,UA: NEGATIVE
RBC, UA: NEGATIVE
Specific Gravity, UA: 1.01 (ref 1.005–1.030)
Urobilinogen, Ur: 0.2 mg/dL (ref 0.2–1.0)
pH, UA: 5.5 (ref 5.0–7.5)

## 2022-10-16 ENCOUNTER — Telehealth: Payer: Self-pay | Admitting: Family Medicine

## 2022-10-16 NOTE — Telephone Encounter (Signed)
Patient is checking on referral for her diabetes. She states hasn't heard anything, She was seen 09/16/22 and was discussed at her visit.please advise

## 2022-10-17 ENCOUNTER — Other Ambulatory Visit: Payer: Self-pay

## 2022-10-17 DIAGNOSIS — E1165 Type 2 diabetes mellitus with hyperglycemia: Secondary | ICD-10-CM

## 2022-10-17 DIAGNOSIS — H524 Presbyopia: Secondary | ICD-10-CM | POA: Diagnosis not present

## 2022-10-17 NOTE — Telephone Encounter (Signed)
Amy Sams, DO     Please place referral to [employer].

## 2022-10-22 ENCOUNTER — Telehealth: Payer: Self-pay | Admitting: Family Medicine

## 2022-10-22 NOTE — Telephone Encounter (Signed)
Son has called twice checking on referral for endocrinology. He states hasn't hear anything since they been in 7/1. Please advise

## 2022-10-22 NOTE — Telephone Encounter (Signed)
Referral has been sent to [facility]. Number given to son(DPR) for him to call and check on status of appointment.

## 2022-10-22 NOTE — Telephone Encounter (Signed)
Tommie Sams, DO     Nothing else to do unless she wants to see provider at [facility].

## 2022-10-23 ENCOUNTER — Other Ambulatory Visit: Payer: Self-pay | Admitting: Family Medicine

## 2022-10-24 ENCOUNTER — Other Ambulatory Visit: Payer: Self-pay | Admitting: Urology

## 2022-10-30 ENCOUNTER — Other Ambulatory Visit: Payer: Self-pay | Admitting: Neurology

## 2022-10-30 ENCOUNTER — Other Ambulatory Visit: Payer: Self-pay | Admitting: Family Medicine

## 2022-10-30 ENCOUNTER — Telehealth: Payer: Self-pay | Admitting: Neurology

## 2022-10-30 MED ORDER — GABAPENTIN 100 MG PO CAPS: 100.0000 mg | ORAL_CAPSULE | Freq: Two times a day (BID) | ORAL | 5 refills | Status: DC

## 2022-10-30 NOTE — Telephone Encounter (Signed)
[facility] pharmacy called and LM with AN. Patient needs a refill on her gabapentin. Any questions call [phone]

## 2022-10-31 ENCOUNTER — Other Ambulatory Visit: Payer: Self-pay | Admitting: Family Medicine

## 2022-11-12 DIAGNOSIS — E1129 Type 2 diabetes mellitus with other diabetic kidney complication: Secondary | ICD-10-CM | POA: Diagnosis not present

## 2022-11-12 DIAGNOSIS — R809 Proteinuria, unspecified: Secondary | ICD-10-CM | POA: Diagnosis not present

## 2022-11-12 DIAGNOSIS — E1122 Type 2 diabetes mellitus with diabetic chronic kidney disease: Secondary | ICD-10-CM | POA: Diagnosis not present

## 2022-11-12 DIAGNOSIS — N2581 Secondary hyperparathyroidism of renal origin: Secondary | ICD-10-CM | POA: Diagnosis not present

## 2022-11-12 DIAGNOSIS — I5032 Chronic diastolic (congestive) heart failure: Secondary | ICD-10-CM | POA: Diagnosis not present

## 2022-11-12 DIAGNOSIS — N189 Chronic kidney disease, unspecified: Secondary | ICD-10-CM | POA: Diagnosis not present

## 2022-11-20 DIAGNOSIS — N2581 Secondary hyperparathyroidism of renal origin: Secondary | ICD-10-CM | POA: Diagnosis not present

## 2022-11-20 DIAGNOSIS — I5032 Chronic diastolic (congestive) heart failure: Secondary | ICD-10-CM | POA: Diagnosis not present

## 2022-11-20 DIAGNOSIS — E1122 Type 2 diabetes mellitus with diabetic chronic kidney disease: Secondary | ICD-10-CM | POA: Diagnosis not present

## 2022-11-20 DIAGNOSIS — D638 Anemia in other chronic diseases classified elsewhere: Secondary | ICD-10-CM | POA: Diagnosis not present

## 2022-11-20 LAB — LAB REPORT - SCANNED
A1c: 8.3
Creatinine, POC: 34.9 mg/dL
EGFR: 22

## 2022-11-24 ENCOUNTER — Other Ambulatory Visit: Payer: Self-pay | Admitting: Neurology

## 2022-11-28 DIAGNOSIS — E039 Hypothyroidism, unspecified: Secondary | ICD-10-CM | POA: Diagnosis not present

## 2022-11-28 DIAGNOSIS — Z9849 Cataract extraction status, unspecified eye: Secondary | ICD-10-CM | POA: Diagnosis not present

## 2022-11-28 DIAGNOSIS — N2581 Secondary hyperparathyroidism of renal origin: Secondary | ICD-10-CM | POA: Diagnosis not present

## 2022-11-29 ENCOUNTER — Other Ambulatory Visit: Payer: Self-pay | Admitting: Neurology

## 2022-12-02 ENCOUNTER — Telehealth: Payer: Self-pay | Admitting: Neurology

## 2022-12-02 NOTE — Telephone Encounter (Signed)
Pt's son called in stating the pharmacy told him we declined to fill the pt's gabapentin prescription. He states she only has enough to last her through 11/29/22. He would like a call back about why we will not send refills.

## 2022-12-02 NOTE — Telephone Encounter (Signed)
Per rep at the pharmacy [facility] was pick up on 12/01/22.

## 2022-12-16 DIAGNOSIS — M79672 Pain in left foot: Secondary | ICD-10-CM | POA: Diagnosis not present

## 2022-12-16 DIAGNOSIS — M79671 Pain in right foot: Secondary | ICD-10-CM | POA: Diagnosis not present

## 2022-12-16 DIAGNOSIS — E1140 Type 2 diabetes mellitus with diabetic neuropathy, unspecified: Secondary | ICD-10-CM | POA: Diagnosis not present

## 2022-12-16 DIAGNOSIS — I739 Peripheral vascular disease, unspecified: Secondary | ICD-10-CM | POA: Diagnosis not present

## 2022-12-17 ENCOUNTER — Ambulatory Visit (INDEPENDENT_AMBULATORY_CARE_PROVIDER_SITE_OTHER): Payer: Medicare Other | Admitting: Family Medicine

## 2022-12-17 VITALS — BP 120/66 | HR 63 | Temp 98.2°F | Ht 63.0 in | Wt 197.0 lb

## 2022-12-17 DIAGNOSIS — R5381 Other malaise: Secondary | ICD-10-CM

## 2022-12-17 DIAGNOSIS — R300 Dysuria: Secondary | ICD-10-CM | POA: Diagnosis not present

## 2022-12-17 DIAGNOSIS — E1165 Type 2 diabetes mellitus with hyperglycemia: Secondary | ICD-10-CM

## 2022-12-17 DIAGNOSIS — Z23 Encounter for immunization: Secondary | ICD-10-CM

## 2022-12-17 DIAGNOSIS — I10 Essential (primary) hypertension: Secondary | ICD-10-CM

## 2022-12-17 DIAGNOSIS — Z794 Long term (current) use of insulin: Secondary | ICD-10-CM

## 2022-12-17 DIAGNOSIS — F419 Anxiety disorder, unspecified: Secondary | ICD-10-CM

## 2022-12-17 DIAGNOSIS — G629 Polyneuropathy, unspecified: Secondary | ICD-10-CM

## 2022-12-17 LAB — POCT URINALYSIS DIP (CLINITEK)
Bilirubin, UA: NEGATIVE
Blood, UA: NEGATIVE
Glucose, UA: NEGATIVE mg/dL
Ketones, POC UA: NEGATIVE mg/dL
Leukocytes, UA: NEGATIVE
Nitrite, UA: NEGATIVE
POC PROTEIN,UA: NEGATIVE
Spec Grav, UA: 1.01 (ref 1.010–1.025)
Urobilinogen, UA: 0.2 U/dL
pH, UA: 6 (ref 5.0–8.0)

## 2022-12-17 MED ORDER — ESCITALOPRAM OXALATE 20 MG PO TABS: 20.0000 mg | ORAL_TABLET | Freq: Every day | ORAL | 1 refills | Status: DC

## 2022-12-17 MED ORDER — GABAPENTIN 100 MG PO CAPS: ORAL_CAPSULE | ORAL | 1 refills | Status: DC

## 2022-12-17 NOTE — Patient Instructions (Addendum)
PT ordered placed.  Gabapentin and Lexapro increased.  Follow up in ~ 3 months.

## 2022-12-18 DIAGNOSIS — R5381 Other malaise: Secondary | ICD-10-CM | POA: Insufficient documentation

## 2022-12-18 DIAGNOSIS — R300 Dysuria: Secondary | ICD-10-CM | POA: Insufficient documentation

## 2022-12-18 DIAGNOSIS — G629 Polyneuropathy, unspecified: Secondary | ICD-10-CM | POA: Insufficient documentation

## 2022-12-18 NOTE — Assessment & Plan Note (Signed)
Home health PT?

## 2022-12-18 NOTE — Assessment & Plan Note (Signed)
I adjusted patient's bolus insulin to reflect less insulin sensitivity.  Her diabetes is decently controlled given advanced age.  No hypoglycemia.

## 2022-12-18 NOTE — Assessment & Plan Note (Signed)
Stable.  Continue amlodipine and Lasix. ?

## 2022-12-18 NOTE — Assessment & Plan Note (Signed)
Uncontrolled.  Increasing Lexapro.

## 2022-12-18 NOTE — Progress Notes (Signed)
Subjective:  Patient ID: Amy Kelly, female    DOB: [birth year]  Age: 89 y.o. MRN: [id number]  CC: Chief Complaint  Patient presents with   Diabetes    Neuropathy in feet and legs   pain in legs     Trouble ambulating , sob with ambulation   panic attacks     High bp upon waking up in the am     HPI:  89 year old female with the below mentioned medical problems presents for follow-up.  Patient had recent labs done by nephrology.  A1c was improved from 9.1 to 8.3.  Labs were obtained on 8/27.  Patient's fasting sugars are quite well-controlled.  Her elevated sugars are postprandial.  Will discuss increasing bolus insulin today.  Hypertension is well-controlled at this time on amlodipine and Lasix.  Patient is concerned that she is not as mobile as she would like to be.  She states that she has more difficulty doing her normal activities.  Son states that she has responded well to physical therapy previously.  She would like to proceed with physical therapy.  Patient has recently been having bouts of what she believes to be is anxiety.  She states that her throat feels tight and then she starts crying.  She has a history of panic attack.  She is currently on Lexapro.  Will discuss dose increase today.  Additionally, patient reports recent development of dysuria.  She states that it is uncomfortable when she urinates.  No frequency or urgency reported.  Had a recent urinalysis in August which was normal.  Patient also is having worsening numbness in her feet.  She is on gabapentin.  Patient Active Problem List   Diagnosis Date Noted   Neuropathy 12/18/2022   Physical deconditioning 12/18/2022   Dysuria 12/18/2022   Uncontrolled type 2 diabetes mellitus with hyperglycemia (HCC) 07/07/2022   RLS (restless legs syndrome) 07/07/2022   Chronic diastolic heart failure (HCC) 07/03/2022   Constipation 02/24/2022   Urge incontinence 09/10/2021   CKD (chronic kidney disease), stage IV  (HCC) 11/09/2020   HTN (hypertension) 11/08/2020   Hypothyroidism 11/08/2020   Anxiety 11/08/2020   Stroke (HCC) 11/08/2020   Anemia 11/08/2020    Social Hx   Social History   Socioeconomic History   Marital status: Widowed    Spouse name: Not on file   Number of children: Not on file   Years of education: Not on file   Highest education level: 7th grade  Occupational History   Not on file  Tobacco Use   Smoking status: Never    Passive exposure: Never   Smokeless tobacco: Never  Vaping Use   Vaping status: Never Used  Substance and Sexual Activity   Alcohol use: Never   Drug use: Never   Sexual activity: Not Currently    Birth control/protection: None  Other Topics Concern   Not on file  Social History Narrative   Right handed   Social Determinants of Health   Financial Resource Strain: Medium Risk (12/14/2022)   Overall Financial Resource Strain (CARDIA)    Difficulty of Paying Living Expenses: Somewhat hard  Food Insecurity: No Food Insecurity (12/14/2022)   Hunger Vital Sign    Worried About Running Out of Food in the Last Year: Never true    Ran Out of Food in the Last Year: Never true  Transportation Needs: No Transportation Needs (12/14/2022)   PRAPARE - Transportation    Lack of Transportation (Medical): No    Lack  of Transportation (Non-Medical): No  Physical Activity: Inactive (12/14/2022)   Exercise Vital Sign    Days of Exercise per Week: 0 days    Minutes of Exercise per Session: 0 min  Stress: Stress Concern Present (12/14/2022)   [employer] of Occupational Health - Occupational Stress Questionnaire    Feeling of Stress : To some extent  Social Connections: Socially Isolated (12/14/2022)   Social Connection and Isolation Panel [NHANES]    Frequency of Communication with Friends and Family: Twice a week    Frequency of Social Gatherings with Friends and Family: Never    Attends Religious Services: Never    Database administrator or  Organizations: No    Attends Banker Meetings: Never    Marital Status: Widowed    Review of Systems Per HPI  Objective:  BP 120/66   Pulse 63   Temp 98.2 F (36.8 C)   Ht 5\' 3"  (1.6 m)   Wt 197 lb (89.4 kg)   SpO2 95%   BMI 34.90 kg/m      12/17/2022    1:09 PM 10/07/2022    3:23 PM 09/16/2022   11:08 AM  BP/Weight  Systolic BP 120 168 128  Diastolic BP 66 75 72  Wt. (Lbs) 197 196.4 196.4  BMI 34.9 kg/m2 34.79 kg/m2 34.79 kg/m2    Physical Exam Vitals and nursing note reviewed.  Constitutional:      General: She is not in acute distress.    Appearance: Normal appearance.  HENT:     Head: Normocephalic and atraumatic.  Eyes:     General:        Right eye: No discharge.        Left eye: No discharge.     Conjunctiva/sclera: Conjunctivae normal.  Cardiovascular:     Rate and Rhythm: Normal rate and regular rhythm.     Heart sounds: Murmur heard.  Pulmonary:     Effort: Pulmonary effort is normal.     Breath sounds: Normal breath sounds. No wheezing, rhonchi or rales.  Abdominal:     General: There is no distension.     Palpations: Abdomen is soft.     Tenderness: There is no abdominal tenderness.  Neurological:     Mental Status: She is alert.  Psychiatric:        Mood and Affect: Mood normal.        Behavior: Behavior normal.     Lab Results  Component Value Date   WBC 8.5 09/05/2022   HGB 11.5 (L) 09/05/2022   HCT 35.5 (L) 09/05/2022   PLT 194 09/05/2022   GLUCOSE 306 (H) 09/05/2022   CHOL 108 09/16/2022   TRIG 156 (H) 09/16/2022   HDL 36 (L) 09/16/2022   LDLCALC 45 09/16/2022   NA 135 09/05/2022   K 4.1 09/05/2022   CL 97 (L) 09/05/2022   CREATININE 2.12 (H) 09/05/2022   BUN 53 (H) 09/05/2022   CO2 27 09/05/2022   TSH 4.565 (H) 11/08/2020   HGBA1C 9.1 (H) 09/16/2022     Assessment & Plan:   Problem List Items Addressed This Visit       Cardiovascular and Mediastinum   HTN (hypertension)    Stable.  Continue amlodipine  and Lasix.        Endocrine   Uncontrolled type 2 diabetes mellitus with hyperglycemia (HCC) - Primary    I adjusted patient's bolus insulin to reflect less insulin sensitivity.  Her diabetes is decently controlled given  advanced age.  No hypoglycemia.        Nervous and Auditory   Neuropathy    Increasing gabapentin.        Other   Anxiety    Uncontrolled.  Increasing Lexapro.      Relevant Medications   escitalopram (LEXAPRO) 20 MG tablet   Dysuria    UA clear today.      Relevant Orders   POCT URINALYSIS DIP (CLINITEK) (Completed)   Physical deconditioning    Home health PT.      Relevant Orders   Ambulatory referral to Home Health   Other Visit Diagnoses     Immunization due       Relevant Orders   Flu Vaccine Trivalent High Dose (Fluad)       Meds ordered this encounter  Medications   escitalopram (LEXAPRO) 20 MG tablet    Sig: Take 1 tablet (20 mg total) by mouth daily.    Dispense:  90 tablet    Refill:  1   gabapentin (NEURONTIN) 100 MG capsule    Sig: 1 capsule in the morning and 2 at night.    Dispense:  270 capsule    Refill:  1    Follow-up:  Return in about 3 months (around 03/19/2023).  Everlene Other DO [facility]

## 2022-12-18 NOTE — Assessment & Plan Note (Signed)
UA clear today. 

## 2022-12-18 NOTE — Assessment & Plan Note (Signed)
Increasing gabapentin

## 2022-12-24 ENCOUNTER — Telehealth: Payer: Self-pay | Admitting: Family Medicine

## 2022-12-24 NOTE — Telephone Encounter (Signed)
Patient is requesting a referral to ENT  having issues hearing.

## 2022-12-25 ENCOUNTER — Other Ambulatory Visit: Payer: Self-pay

## 2022-12-25 DIAGNOSIS — I509 Heart failure, unspecified: Secondary | ICD-10-CM | POA: Diagnosis not present

## 2022-12-25 DIAGNOSIS — N189 Chronic kidney disease, unspecified: Secondary | ICD-10-CM | POA: Diagnosis not present

## 2022-12-25 DIAGNOSIS — M1990 Unspecified osteoarthritis, unspecified site: Secondary | ICD-10-CM | POA: Diagnosis not present

## 2022-12-25 DIAGNOSIS — R262 Difficulty in walking, not elsewhere classified: Secondary | ICD-10-CM | POA: Diagnosis not present

## 2022-12-25 DIAGNOSIS — Z7902 Long term (current) use of antithrombotics/antiplatelets: Secondary | ICD-10-CM | POA: Diagnosis not present

## 2022-12-25 DIAGNOSIS — H9190 Unspecified hearing loss, unspecified ear: Secondary | ICD-10-CM

## 2022-12-25 DIAGNOSIS — F410 Panic disorder [episodic paroxysmal anxiety] without agoraphobia: Secondary | ICD-10-CM | POA: Diagnosis not present

## 2022-12-25 DIAGNOSIS — Z794 Long term (current) use of insulin: Secondary | ICD-10-CM | POA: Diagnosis not present

## 2022-12-25 DIAGNOSIS — E1140 Type 2 diabetes mellitus with diabetic neuropathy, unspecified: Secondary | ICD-10-CM | POA: Diagnosis not present

## 2022-12-25 DIAGNOSIS — M6281 Muscle weakness (generalized): Secondary | ICD-10-CM | POA: Diagnosis not present

## 2022-12-25 DIAGNOSIS — E1122 Type 2 diabetes mellitus with diabetic chronic kidney disease: Secondary | ICD-10-CM | POA: Diagnosis not present

## 2022-12-25 DIAGNOSIS — Z556 Problems related to health literacy: Secondary | ICD-10-CM | POA: Diagnosis not present

## 2022-12-25 NOTE — Telephone Encounter (Signed)
Referral placed to ENT for hearing loss.

## 2023-01-02 ENCOUNTER — Telehealth: Payer: Self-pay

## 2023-01-02 DIAGNOSIS — M6281 Muscle weakness (generalized): Secondary | ICD-10-CM | POA: Diagnosis not present

## 2023-01-02 DIAGNOSIS — Z794 Long term (current) use of insulin: Secondary | ICD-10-CM | POA: Diagnosis not present

## 2023-01-02 DIAGNOSIS — M1990 Unspecified osteoarthritis, unspecified site: Secondary | ICD-10-CM | POA: Diagnosis not present

## 2023-01-02 DIAGNOSIS — E1122 Type 2 diabetes mellitus with diabetic chronic kidney disease: Secondary | ICD-10-CM | POA: Diagnosis not present

## 2023-01-02 DIAGNOSIS — Z7902 Long term (current) use of antithrombotics/antiplatelets: Secondary | ICD-10-CM | POA: Diagnosis not present

## 2023-01-02 DIAGNOSIS — Z556 Problems related to health literacy: Secondary | ICD-10-CM | POA: Diagnosis not present

## 2023-01-02 DIAGNOSIS — N189 Chronic kidney disease, unspecified: Secondary | ICD-10-CM | POA: Diagnosis not present

## 2023-01-02 DIAGNOSIS — R262 Difficulty in walking, not elsewhere classified: Secondary | ICD-10-CM | POA: Diagnosis not present

## 2023-01-02 DIAGNOSIS — F410 Panic disorder [episodic paroxysmal anxiety] without agoraphobia: Secondary | ICD-10-CM | POA: Diagnosis not present

## 2023-01-02 DIAGNOSIS — E1140 Type 2 diabetes mellitus with diabetic neuropathy, unspecified: Secondary | ICD-10-CM | POA: Diagnosis not present

## 2023-01-02 DIAGNOSIS — I509 Heart failure, unspecified: Secondary | ICD-10-CM | POA: Diagnosis not present

## 2023-01-02 NOTE — Telephone Encounter (Signed)
Pt was on Novolog and pharmacy is saying that is not in their formulary and want to switch to Humalog. Pharmacy asked patient to contact Dr Adriana Simas. This is through [facility] order please advise   Contact [phone] Maurine Minister (son)

## 2023-01-02 NOTE — Telephone Encounter (Signed)
Pt was on Novolog and pharmacy is saying that is not in their formulary and want to switch to Humalog. Pharmacy asked patient to contact Dr Adriana Simas. This is through [employer] [phone] Maurine Minister (son)

## 2023-01-03 ENCOUNTER — Other Ambulatory Visit: Payer: Self-pay | Admitting: Family Medicine

## 2023-01-03 DIAGNOSIS — R262 Difficulty in walking, not elsewhere classified: Secondary | ICD-10-CM | POA: Diagnosis not present

## 2023-01-03 DIAGNOSIS — Z794 Long term (current) use of insulin: Secondary | ICD-10-CM | POA: Diagnosis not present

## 2023-01-03 DIAGNOSIS — Z556 Problems related to health literacy: Secondary | ICD-10-CM | POA: Diagnosis not present

## 2023-01-03 DIAGNOSIS — F410 Panic disorder [episodic paroxysmal anxiety] without agoraphobia: Secondary | ICD-10-CM | POA: Diagnosis not present

## 2023-01-03 DIAGNOSIS — E1140 Type 2 diabetes mellitus with diabetic neuropathy, unspecified: Secondary | ICD-10-CM | POA: Diagnosis not present

## 2023-01-03 DIAGNOSIS — E1122 Type 2 diabetes mellitus with diabetic chronic kidney disease: Secondary | ICD-10-CM | POA: Diagnosis not present

## 2023-01-03 DIAGNOSIS — M1990 Unspecified osteoarthritis, unspecified site: Secondary | ICD-10-CM | POA: Diagnosis not present

## 2023-01-03 DIAGNOSIS — M6281 Muscle weakness (generalized): Secondary | ICD-10-CM | POA: Diagnosis not present

## 2023-01-03 DIAGNOSIS — I509 Heart failure, unspecified: Secondary | ICD-10-CM | POA: Diagnosis not present

## 2023-01-03 DIAGNOSIS — N189 Chronic kidney disease, unspecified: Secondary | ICD-10-CM | POA: Diagnosis not present

## 2023-01-03 DIAGNOSIS — Z7902 Long term (current) use of antithrombotics/antiplatelets: Secondary | ICD-10-CM | POA: Diagnosis not present

## 2023-01-03 MED ORDER — INSULIN LISPRO 100 UNIT/ML IJ SOLN
1.0000 [IU] | Freq: Three times a day (TID) | INTRAMUSCULAR | 11 refills | Status: DC
Start: 2023-01-03 — End: 2023-01-09

## 2023-01-03 NOTE — Telephone Encounter (Signed)
Called to inform insulin was sent in , but phone was not accepting any voicemails

## 2023-01-04 DIAGNOSIS — R262 Difficulty in walking, not elsewhere classified: Secondary | ICD-10-CM | POA: Diagnosis not present

## 2023-01-04 DIAGNOSIS — Z7902 Long term (current) use of antithrombotics/antiplatelets: Secondary | ICD-10-CM | POA: Diagnosis not present

## 2023-01-04 DIAGNOSIS — E1122 Type 2 diabetes mellitus with diabetic chronic kidney disease: Secondary | ICD-10-CM | POA: Diagnosis not present

## 2023-01-04 DIAGNOSIS — I509 Heart failure, unspecified: Secondary | ICD-10-CM | POA: Diagnosis not present

## 2023-01-04 DIAGNOSIS — N189 Chronic kidney disease, unspecified: Secondary | ICD-10-CM | POA: Diagnosis not present

## 2023-01-04 DIAGNOSIS — M1990 Unspecified osteoarthritis, unspecified site: Secondary | ICD-10-CM | POA: Diagnosis not present

## 2023-01-04 DIAGNOSIS — E1140 Type 2 diabetes mellitus with diabetic neuropathy, unspecified: Secondary | ICD-10-CM | POA: Diagnosis not present

## 2023-01-04 DIAGNOSIS — Z794 Long term (current) use of insulin: Secondary | ICD-10-CM | POA: Diagnosis not present

## 2023-01-04 DIAGNOSIS — F410 Panic disorder [episodic paroxysmal anxiety] without agoraphobia: Secondary | ICD-10-CM | POA: Diagnosis not present

## 2023-01-04 DIAGNOSIS — M6281 Muscle weakness (generalized): Secondary | ICD-10-CM | POA: Diagnosis not present

## 2023-01-04 DIAGNOSIS — Z556 Problems related to health literacy: Secondary | ICD-10-CM | POA: Diagnosis not present

## 2023-01-07 DIAGNOSIS — I509 Heart failure, unspecified: Secondary | ICD-10-CM | POA: Diagnosis not present

## 2023-01-07 DIAGNOSIS — M1990 Unspecified osteoarthritis, unspecified site: Secondary | ICD-10-CM | POA: Diagnosis not present

## 2023-01-07 DIAGNOSIS — Z556 Problems related to health literacy: Secondary | ICD-10-CM | POA: Diagnosis not present

## 2023-01-07 DIAGNOSIS — M6281 Muscle weakness (generalized): Secondary | ICD-10-CM | POA: Diagnosis not present

## 2023-01-07 DIAGNOSIS — E1122 Type 2 diabetes mellitus with diabetic chronic kidney disease: Secondary | ICD-10-CM | POA: Diagnosis not present

## 2023-01-07 DIAGNOSIS — E1140 Type 2 diabetes mellitus with diabetic neuropathy, unspecified: Secondary | ICD-10-CM | POA: Diagnosis not present

## 2023-01-07 DIAGNOSIS — Z7902 Long term (current) use of antithrombotics/antiplatelets: Secondary | ICD-10-CM | POA: Diagnosis not present

## 2023-01-07 DIAGNOSIS — F410 Panic disorder [episodic paroxysmal anxiety] without agoraphobia: Secondary | ICD-10-CM | POA: Diagnosis not present

## 2023-01-07 DIAGNOSIS — R262 Difficulty in walking, not elsewhere classified: Secondary | ICD-10-CM | POA: Diagnosis not present

## 2023-01-07 DIAGNOSIS — Z794 Long term (current) use of insulin: Secondary | ICD-10-CM | POA: Diagnosis not present

## 2023-01-07 DIAGNOSIS — N189 Chronic kidney disease, unspecified: Secondary | ICD-10-CM | POA: Diagnosis not present

## 2023-01-09 ENCOUNTER — Other Ambulatory Visit: Payer: Self-pay | Admitting: Nurse Practitioner

## 2023-01-09 ENCOUNTER — Telehealth: Payer: Self-pay

## 2023-01-09 MED ORDER — INSULIN LISPRO (1 UNIT DIAL) 100 UNIT/ML (KWIKPEN): PEN_INJECTOR | SUBCUTANEOUS | 2 refills | Status: DC

## 2023-01-09 NOTE — Telephone Encounter (Signed)
[facility] pharmacy called stated that insulin lispro (HUMALOG) 100 UNIT/ML injection that has been ordered is a vial and the son informed pharmacist that patient needs the pens not the vials. Can this be resent for the pens?

## 2023-01-10 DIAGNOSIS — N189 Chronic kidney disease, unspecified: Secondary | ICD-10-CM | POA: Diagnosis not present

## 2023-01-10 DIAGNOSIS — I509 Heart failure, unspecified: Secondary | ICD-10-CM | POA: Diagnosis not present

## 2023-01-10 DIAGNOSIS — E1122 Type 2 diabetes mellitus with diabetic chronic kidney disease: Secondary | ICD-10-CM | POA: Diagnosis not present

## 2023-01-10 DIAGNOSIS — F410 Panic disorder [episodic paroxysmal anxiety] without agoraphobia: Secondary | ICD-10-CM | POA: Diagnosis not present

## 2023-01-10 DIAGNOSIS — Z794 Long term (current) use of insulin: Secondary | ICD-10-CM | POA: Diagnosis not present

## 2023-01-10 DIAGNOSIS — Z556 Problems related to health literacy: Secondary | ICD-10-CM | POA: Diagnosis not present

## 2023-01-10 DIAGNOSIS — E1140 Type 2 diabetes mellitus with diabetic neuropathy, unspecified: Secondary | ICD-10-CM | POA: Diagnosis not present

## 2023-01-10 DIAGNOSIS — M6281 Muscle weakness (generalized): Secondary | ICD-10-CM | POA: Diagnosis not present

## 2023-01-10 DIAGNOSIS — R262 Difficulty in walking, not elsewhere classified: Secondary | ICD-10-CM | POA: Diagnosis not present

## 2023-01-10 DIAGNOSIS — M1990 Unspecified osteoarthritis, unspecified site: Secondary | ICD-10-CM | POA: Diagnosis not present

## 2023-01-10 DIAGNOSIS — Z7902 Long term (current) use of antithrombotics/antiplatelets: Secondary | ICD-10-CM | POA: Diagnosis not present

## 2023-01-10 NOTE — Telephone Encounter (Signed)
Done

## 2023-01-14 ENCOUNTER — Other Ambulatory Visit: Payer: Self-pay | Admitting: Family Medicine

## 2023-01-14 DIAGNOSIS — Z794 Long term (current) use of insulin: Secondary | ICD-10-CM | POA: Diagnosis not present

## 2023-01-14 DIAGNOSIS — Z7902 Long term (current) use of antithrombotics/antiplatelets: Secondary | ICD-10-CM | POA: Diagnosis not present

## 2023-01-14 DIAGNOSIS — Z556 Problems related to health literacy: Secondary | ICD-10-CM | POA: Diagnosis not present

## 2023-01-14 DIAGNOSIS — I509 Heart failure, unspecified: Secondary | ICD-10-CM | POA: Diagnosis not present

## 2023-01-14 DIAGNOSIS — M6281 Muscle weakness (generalized): Secondary | ICD-10-CM | POA: Diagnosis not present

## 2023-01-14 DIAGNOSIS — M1990 Unspecified osteoarthritis, unspecified site: Secondary | ICD-10-CM | POA: Diagnosis not present

## 2023-01-14 DIAGNOSIS — R262 Difficulty in walking, not elsewhere classified: Secondary | ICD-10-CM | POA: Diagnosis not present

## 2023-01-14 DIAGNOSIS — F410 Panic disorder [episodic paroxysmal anxiety] without agoraphobia: Secondary | ICD-10-CM | POA: Diagnosis not present

## 2023-01-14 DIAGNOSIS — N189 Chronic kidney disease, unspecified: Secondary | ICD-10-CM | POA: Diagnosis not present

## 2023-01-14 DIAGNOSIS — E1140 Type 2 diabetes mellitus with diabetic neuropathy, unspecified: Secondary | ICD-10-CM | POA: Diagnosis not present

## 2023-01-14 DIAGNOSIS — E1122 Type 2 diabetes mellitus with diabetic chronic kidney disease: Secondary | ICD-10-CM | POA: Diagnosis not present

## 2023-01-15 DIAGNOSIS — F410 Panic disorder [episodic paroxysmal anxiety] without agoraphobia: Secondary | ICD-10-CM | POA: Diagnosis not present

## 2023-01-15 DIAGNOSIS — E1140 Type 2 diabetes mellitus with diabetic neuropathy, unspecified: Secondary | ICD-10-CM | POA: Diagnosis not present

## 2023-01-15 DIAGNOSIS — Z556 Problems related to health literacy: Secondary | ICD-10-CM | POA: Diagnosis not present

## 2023-01-15 DIAGNOSIS — Z7902 Long term (current) use of antithrombotics/antiplatelets: Secondary | ICD-10-CM | POA: Diagnosis not present

## 2023-01-15 DIAGNOSIS — Z794 Long term (current) use of insulin: Secondary | ICD-10-CM | POA: Diagnosis not present

## 2023-01-15 DIAGNOSIS — N189 Chronic kidney disease, unspecified: Secondary | ICD-10-CM | POA: Diagnosis not present

## 2023-01-15 DIAGNOSIS — E1122 Type 2 diabetes mellitus with diabetic chronic kidney disease: Secondary | ICD-10-CM | POA: Diagnosis not present

## 2023-01-15 DIAGNOSIS — I509 Heart failure, unspecified: Secondary | ICD-10-CM | POA: Diagnosis not present

## 2023-01-15 DIAGNOSIS — R262 Difficulty in walking, not elsewhere classified: Secondary | ICD-10-CM | POA: Diagnosis not present

## 2023-01-15 DIAGNOSIS — M1990 Unspecified osteoarthritis, unspecified site: Secondary | ICD-10-CM | POA: Diagnosis not present

## 2023-01-15 DIAGNOSIS — M6281 Muscle weakness (generalized): Secondary | ICD-10-CM | POA: Diagnosis not present

## 2023-01-15 NOTE — Telephone Encounter (Signed)
Patient's son checking on referral for ENT, He states been two weeks now and hasn't heard anything . Please advise.

## 2023-01-16 ENCOUNTER — Encounter (INDEPENDENT_AMBULATORY_CARE_PROVIDER_SITE_OTHER): Payer: Self-pay | Admitting: Otolaryngology

## 2023-01-16 NOTE — Telephone Encounter (Signed)
Message sent to referral coordinator

## 2023-01-16 NOTE — Telephone Encounter (Signed)
Referral coordinator spoke with son and gave information to ENT/Audiologist

## 2023-01-17 DIAGNOSIS — M1990 Unspecified osteoarthritis, unspecified site: Secondary | ICD-10-CM | POA: Diagnosis not present

## 2023-01-17 DIAGNOSIS — F410 Panic disorder [episodic paroxysmal anxiety] without agoraphobia: Secondary | ICD-10-CM | POA: Diagnosis not present

## 2023-01-17 DIAGNOSIS — I509 Heart failure, unspecified: Secondary | ICD-10-CM | POA: Diagnosis not present

## 2023-01-17 DIAGNOSIS — M6281 Muscle weakness (generalized): Secondary | ICD-10-CM | POA: Diagnosis not present

## 2023-01-17 DIAGNOSIS — R262 Difficulty in walking, not elsewhere classified: Secondary | ICD-10-CM | POA: Diagnosis not present

## 2023-01-17 DIAGNOSIS — E1140 Type 2 diabetes mellitus with diabetic neuropathy, unspecified: Secondary | ICD-10-CM | POA: Diagnosis not present

## 2023-01-17 DIAGNOSIS — E1122 Type 2 diabetes mellitus with diabetic chronic kidney disease: Secondary | ICD-10-CM | POA: Diagnosis not present

## 2023-01-17 DIAGNOSIS — Z556 Problems related to health literacy: Secondary | ICD-10-CM | POA: Diagnosis not present

## 2023-01-17 DIAGNOSIS — Z7902 Long term (current) use of antithrombotics/antiplatelets: Secondary | ICD-10-CM | POA: Diagnosis not present

## 2023-01-17 DIAGNOSIS — N189 Chronic kidney disease, unspecified: Secondary | ICD-10-CM | POA: Diagnosis not present

## 2023-01-17 DIAGNOSIS — Z794 Long term (current) use of insulin: Secondary | ICD-10-CM | POA: Diagnosis not present

## 2023-01-21 DIAGNOSIS — N189 Chronic kidney disease, unspecified: Secondary | ICD-10-CM | POA: Diagnosis not present

## 2023-01-21 DIAGNOSIS — E1140 Type 2 diabetes mellitus with diabetic neuropathy, unspecified: Secondary | ICD-10-CM | POA: Diagnosis not present

## 2023-01-21 DIAGNOSIS — M6281 Muscle weakness (generalized): Secondary | ICD-10-CM | POA: Diagnosis not present

## 2023-01-21 DIAGNOSIS — R262 Difficulty in walking, not elsewhere classified: Secondary | ICD-10-CM | POA: Diagnosis not present

## 2023-01-21 DIAGNOSIS — E1122 Type 2 diabetes mellitus with diabetic chronic kidney disease: Secondary | ICD-10-CM | POA: Diagnosis not present

## 2023-01-21 DIAGNOSIS — F410 Panic disorder [episodic paroxysmal anxiety] without agoraphobia: Secondary | ICD-10-CM | POA: Diagnosis not present

## 2023-01-21 DIAGNOSIS — Z556 Problems related to health literacy: Secondary | ICD-10-CM | POA: Diagnosis not present

## 2023-01-21 DIAGNOSIS — I509 Heart failure, unspecified: Secondary | ICD-10-CM | POA: Diagnosis not present

## 2023-01-21 DIAGNOSIS — Z7902 Long term (current) use of antithrombotics/antiplatelets: Secondary | ICD-10-CM | POA: Diagnosis not present

## 2023-01-21 DIAGNOSIS — M1990 Unspecified osteoarthritis, unspecified site: Secondary | ICD-10-CM | POA: Diagnosis not present

## 2023-01-21 DIAGNOSIS — Z794 Long term (current) use of insulin: Secondary | ICD-10-CM | POA: Diagnosis not present

## 2023-01-23 DIAGNOSIS — Z794 Long term (current) use of insulin: Secondary | ICD-10-CM | POA: Diagnosis not present

## 2023-01-23 DIAGNOSIS — R262 Difficulty in walking, not elsewhere classified: Secondary | ICD-10-CM | POA: Diagnosis not present

## 2023-01-23 DIAGNOSIS — F410 Panic disorder [episodic paroxysmal anxiety] without agoraphobia: Secondary | ICD-10-CM | POA: Diagnosis not present

## 2023-01-23 DIAGNOSIS — M6281 Muscle weakness (generalized): Secondary | ICD-10-CM | POA: Diagnosis not present

## 2023-01-23 DIAGNOSIS — Z556 Problems related to health literacy: Secondary | ICD-10-CM | POA: Diagnosis not present

## 2023-01-23 DIAGNOSIS — Z7902 Long term (current) use of antithrombotics/antiplatelets: Secondary | ICD-10-CM | POA: Diagnosis not present

## 2023-01-23 DIAGNOSIS — N189 Chronic kidney disease, unspecified: Secondary | ICD-10-CM | POA: Diagnosis not present

## 2023-01-23 DIAGNOSIS — I509 Heart failure, unspecified: Secondary | ICD-10-CM | POA: Diagnosis not present

## 2023-01-23 DIAGNOSIS — E1140 Type 2 diabetes mellitus with diabetic neuropathy, unspecified: Secondary | ICD-10-CM | POA: Diagnosis not present

## 2023-01-23 DIAGNOSIS — M1990 Unspecified osteoarthritis, unspecified site: Secondary | ICD-10-CM | POA: Diagnosis not present

## 2023-01-23 DIAGNOSIS — E1122 Type 2 diabetes mellitus with diabetic chronic kidney disease: Secondary | ICD-10-CM | POA: Diagnosis not present

## 2023-01-24 DIAGNOSIS — M1990 Unspecified osteoarthritis, unspecified site: Secondary | ICD-10-CM | POA: Diagnosis not present

## 2023-01-24 DIAGNOSIS — M6281 Muscle weakness (generalized): Secondary | ICD-10-CM | POA: Diagnosis not present

## 2023-01-24 DIAGNOSIS — N189 Chronic kidney disease, unspecified: Secondary | ICD-10-CM | POA: Diagnosis not present

## 2023-01-24 DIAGNOSIS — R262 Difficulty in walking, not elsewhere classified: Secondary | ICD-10-CM | POA: Diagnosis not present

## 2023-01-24 DIAGNOSIS — Z7902 Long term (current) use of antithrombotics/antiplatelets: Secondary | ICD-10-CM | POA: Diagnosis not present

## 2023-01-24 DIAGNOSIS — I509 Heart failure, unspecified: Secondary | ICD-10-CM | POA: Diagnosis not present

## 2023-01-24 DIAGNOSIS — E1140 Type 2 diabetes mellitus with diabetic neuropathy, unspecified: Secondary | ICD-10-CM | POA: Diagnosis not present

## 2023-01-24 DIAGNOSIS — Z794 Long term (current) use of insulin: Secondary | ICD-10-CM | POA: Diagnosis not present

## 2023-01-24 DIAGNOSIS — F410 Panic disorder [episodic paroxysmal anxiety] without agoraphobia: Secondary | ICD-10-CM | POA: Diagnosis not present

## 2023-01-24 DIAGNOSIS — Z556 Problems related to health literacy: Secondary | ICD-10-CM | POA: Diagnosis not present

## 2023-01-24 DIAGNOSIS — E1122 Type 2 diabetes mellitus with diabetic chronic kidney disease: Secondary | ICD-10-CM | POA: Diagnosis not present

## 2023-01-27 DIAGNOSIS — R809 Proteinuria, unspecified: Secondary | ICD-10-CM | POA: Diagnosis not present

## 2023-01-27 DIAGNOSIS — N189 Chronic kidney disease, unspecified: Secondary | ICD-10-CM | POA: Diagnosis not present

## 2023-01-27 DIAGNOSIS — E211 Secondary hyperparathyroidism, not elsewhere classified: Secondary | ICD-10-CM | POA: Diagnosis not present

## 2023-01-27 DIAGNOSIS — D631 Anemia in chronic kidney disease: Secondary | ICD-10-CM | POA: Diagnosis not present

## 2023-01-28 DIAGNOSIS — Z7902 Long term (current) use of antithrombotics/antiplatelets: Secondary | ICD-10-CM | POA: Diagnosis not present

## 2023-01-28 DIAGNOSIS — N189 Chronic kidney disease, unspecified: Secondary | ICD-10-CM | POA: Diagnosis not present

## 2023-01-28 DIAGNOSIS — E1140 Type 2 diabetes mellitus with diabetic neuropathy, unspecified: Secondary | ICD-10-CM | POA: Diagnosis not present

## 2023-01-28 DIAGNOSIS — M6281 Muscle weakness (generalized): Secondary | ICD-10-CM | POA: Diagnosis not present

## 2023-01-28 DIAGNOSIS — F410 Panic disorder [episodic paroxysmal anxiety] without agoraphobia: Secondary | ICD-10-CM | POA: Diagnosis not present

## 2023-01-28 DIAGNOSIS — E1122 Type 2 diabetes mellitus with diabetic chronic kidney disease: Secondary | ICD-10-CM | POA: Diagnosis not present

## 2023-01-28 DIAGNOSIS — I509 Heart failure, unspecified: Secondary | ICD-10-CM | POA: Diagnosis not present

## 2023-01-28 DIAGNOSIS — M1990 Unspecified osteoarthritis, unspecified site: Secondary | ICD-10-CM | POA: Diagnosis not present

## 2023-01-28 DIAGNOSIS — R262 Difficulty in walking, not elsewhere classified: Secondary | ICD-10-CM | POA: Diagnosis not present

## 2023-01-28 DIAGNOSIS — Z556 Problems related to health literacy: Secondary | ICD-10-CM | POA: Diagnosis not present

## 2023-01-28 DIAGNOSIS — Z794 Long term (current) use of insulin: Secondary | ICD-10-CM | POA: Diagnosis not present

## 2023-01-30 DIAGNOSIS — D638 Anemia in other chronic diseases classified elsewhere: Secondary | ICD-10-CM | POA: Diagnosis not present

## 2023-01-30 DIAGNOSIS — E1122 Type 2 diabetes mellitus with diabetic chronic kidney disease: Secondary | ICD-10-CM | POA: Diagnosis not present

## 2023-01-30 DIAGNOSIS — I5032 Chronic diastolic (congestive) heart failure: Secondary | ICD-10-CM | POA: Diagnosis not present

## 2023-01-30 DIAGNOSIS — N2581 Secondary hyperparathyroidism of renal origin: Secondary | ICD-10-CM | POA: Diagnosis not present

## 2023-01-31 ENCOUNTER — Telehealth: Payer: Self-pay

## 2023-01-31 DIAGNOSIS — Z556 Problems related to health literacy: Secondary | ICD-10-CM | POA: Diagnosis not present

## 2023-01-31 DIAGNOSIS — F410 Panic disorder [episodic paroxysmal anxiety] without agoraphobia: Secondary | ICD-10-CM | POA: Diagnosis not present

## 2023-01-31 DIAGNOSIS — Z794 Long term (current) use of insulin: Secondary | ICD-10-CM | POA: Diagnosis not present

## 2023-01-31 DIAGNOSIS — M6281 Muscle weakness (generalized): Secondary | ICD-10-CM | POA: Diagnosis not present

## 2023-01-31 DIAGNOSIS — M1990 Unspecified osteoarthritis, unspecified site: Secondary | ICD-10-CM | POA: Diagnosis not present

## 2023-01-31 DIAGNOSIS — N189 Chronic kidney disease, unspecified: Secondary | ICD-10-CM | POA: Diagnosis not present

## 2023-01-31 DIAGNOSIS — E1140 Type 2 diabetes mellitus with diabetic neuropathy, unspecified: Secondary | ICD-10-CM | POA: Diagnosis not present

## 2023-01-31 DIAGNOSIS — R262 Difficulty in walking, not elsewhere classified: Secondary | ICD-10-CM | POA: Diagnosis not present

## 2023-01-31 DIAGNOSIS — E1122 Type 2 diabetes mellitus with diabetic chronic kidney disease: Secondary | ICD-10-CM | POA: Diagnosis not present

## 2023-01-31 DIAGNOSIS — Z7902 Long term (current) use of antithrombotics/antiplatelets: Secondary | ICD-10-CM | POA: Diagnosis not present

## 2023-01-31 DIAGNOSIS — I509 Heart failure, unspecified: Secondary | ICD-10-CM | POA: Diagnosis not present

## 2023-01-31 NOTE — Telephone Encounter (Signed)
PT's son states that his mother is having pain and itching in the vaginal area and when Dr. Adriana Simas last saw PT on 10/21    he said he will prescribe a cream but PT declined but is now asking for that cream to be filled at [facility]  Please advise

## 2023-02-02 ENCOUNTER — Other Ambulatory Visit: Payer: Self-pay | Admitting: Family Medicine

## 2023-02-02 MED ORDER — ESTRADIOL 0.1 MG/GM VA CREA: 1.0000 | TOPICAL_CREAM | Freq: Every day | VAGINAL | 3 refills | Status: DC

## 2023-02-04 DIAGNOSIS — N189 Chronic kidney disease, unspecified: Secondary | ICD-10-CM | POA: Diagnosis not present

## 2023-02-04 DIAGNOSIS — R262 Difficulty in walking, not elsewhere classified: Secondary | ICD-10-CM | POA: Diagnosis not present

## 2023-02-04 DIAGNOSIS — F410 Panic disorder [episodic paroxysmal anxiety] without agoraphobia: Secondary | ICD-10-CM | POA: Diagnosis not present

## 2023-02-04 DIAGNOSIS — Z556 Problems related to health literacy: Secondary | ICD-10-CM | POA: Diagnosis not present

## 2023-02-04 DIAGNOSIS — Z794 Long term (current) use of insulin: Secondary | ICD-10-CM | POA: Diagnosis not present

## 2023-02-04 DIAGNOSIS — E1140 Type 2 diabetes mellitus with diabetic neuropathy, unspecified: Secondary | ICD-10-CM | POA: Diagnosis not present

## 2023-02-04 DIAGNOSIS — E1122 Type 2 diabetes mellitus with diabetic chronic kidney disease: Secondary | ICD-10-CM | POA: Diagnosis not present

## 2023-02-04 DIAGNOSIS — Z7902 Long term (current) use of antithrombotics/antiplatelets: Secondary | ICD-10-CM | POA: Diagnosis not present

## 2023-02-04 DIAGNOSIS — I509 Heart failure, unspecified: Secondary | ICD-10-CM | POA: Diagnosis not present

## 2023-02-04 DIAGNOSIS — M6281 Muscle weakness (generalized): Secondary | ICD-10-CM | POA: Diagnosis not present

## 2023-02-04 DIAGNOSIS — M1990 Unspecified osteoarthritis, unspecified site: Secondary | ICD-10-CM | POA: Diagnosis not present

## 2023-02-05 DIAGNOSIS — I509 Heart failure, unspecified: Secondary | ICD-10-CM | POA: Diagnosis not present

## 2023-02-05 DIAGNOSIS — M1990 Unspecified osteoarthritis, unspecified site: Secondary | ICD-10-CM | POA: Diagnosis not present

## 2023-02-05 DIAGNOSIS — R262 Difficulty in walking, not elsewhere classified: Secondary | ICD-10-CM | POA: Diagnosis not present

## 2023-02-05 DIAGNOSIS — E1140 Type 2 diabetes mellitus with diabetic neuropathy, unspecified: Secondary | ICD-10-CM | POA: Diagnosis not present

## 2023-02-05 DIAGNOSIS — Z556 Problems related to health literacy: Secondary | ICD-10-CM | POA: Diagnosis not present

## 2023-02-05 DIAGNOSIS — F410 Panic disorder [episodic paroxysmal anxiety] without agoraphobia: Secondary | ICD-10-CM | POA: Diagnosis not present

## 2023-02-05 DIAGNOSIS — M6281 Muscle weakness (generalized): Secondary | ICD-10-CM | POA: Diagnosis not present

## 2023-02-05 DIAGNOSIS — E1122 Type 2 diabetes mellitus with diabetic chronic kidney disease: Secondary | ICD-10-CM | POA: Diagnosis not present

## 2023-02-05 DIAGNOSIS — N189 Chronic kidney disease, unspecified: Secondary | ICD-10-CM | POA: Diagnosis not present

## 2023-02-05 DIAGNOSIS — Z794 Long term (current) use of insulin: Secondary | ICD-10-CM | POA: Diagnosis not present

## 2023-02-05 DIAGNOSIS — Z7902 Long term (current) use of antithrombotics/antiplatelets: Secondary | ICD-10-CM | POA: Diagnosis not present

## 2023-02-06 DIAGNOSIS — Z7902 Long term (current) use of antithrombotics/antiplatelets: Secondary | ICD-10-CM | POA: Diagnosis not present

## 2023-02-06 DIAGNOSIS — E1140 Type 2 diabetes mellitus with diabetic neuropathy, unspecified: Secondary | ICD-10-CM | POA: Diagnosis not present

## 2023-02-06 DIAGNOSIS — N189 Chronic kidney disease, unspecified: Secondary | ICD-10-CM | POA: Diagnosis not present

## 2023-02-06 DIAGNOSIS — F410 Panic disorder [episodic paroxysmal anxiety] without agoraphobia: Secondary | ICD-10-CM | POA: Diagnosis not present

## 2023-02-06 DIAGNOSIS — M1990 Unspecified osteoarthritis, unspecified site: Secondary | ICD-10-CM | POA: Diagnosis not present

## 2023-02-06 DIAGNOSIS — R262 Difficulty in walking, not elsewhere classified: Secondary | ICD-10-CM | POA: Diagnosis not present

## 2023-02-06 DIAGNOSIS — Z794 Long term (current) use of insulin: Secondary | ICD-10-CM | POA: Diagnosis not present

## 2023-02-06 DIAGNOSIS — Z556 Problems related to health literacy: Secondary | ICD-10-CM | POA: Diagnosis not present

## 2023-02-06 DIAGNOSIS — M6281 Muscle weakness (generalized): Secondary | ICD-10-CM | POA: Diagnosis not present

## 2023-02-06 DIAGNOSIS — E1122 Type 2 diabetes mellitus with diabetic chronic kidney disease: Secondary | ICD-10-CM | POA: Diagnosis not present

## 2023-02-06 DIAGNOSIS — I509 Heart failure, unspecified: Secondary | ICD-10-CM | POA: Diagnosis not present

## 2023-02-07 ENCOUNTER — Telehealth: Payer: Self-pay | Admitting: *Deleted

## 2023-02-07 NOTE — Telephone Encounter (Unsigned)
Copied from CRM [id number]. Topic: Clinical - Medication Refill >> Feb 07, 2023 10:21 AM Shelbie Proctor wrote: Most Recent Primary Care Visit:  Provider: Tommie Sams  Department: [facility] MED  Visit Type: OFFICE VISIT  Date: 12/17/2022  Medication: Pt called the pharmacy and [facility] [phone] is calling and need five new rxs with instructions and qty from Dr. Adriana Simas 1. Omeprazole 40 mg  2. Clopidogrel 75  mg 3. Potussium chloride ER 10 MEQ  4. Doxazosin 2 mg 5. Levothyroxine 50 mcg    Has the patient contacted their pharmacy? yes (Agent: If no, request that the patient contact the pharmacy for the refill. If patient does not wish to contact the pharmacy document the reason why and proceed with request.) (Agent: If yes, when and what did the pharmacy advise?)  Is this the correct pharmacy for this prescription? yes [facility] - [place], [place] - [address] [place] [address] Phone: [phone] Fax: [phone]   Has the prescription been filled recently? no  Is the patient out of the medication?   Has the patient been seen for an appointment in the last year OR does the patient have an upcoming appointment?   Can we respond through MyChart? No, please contact [facility]  Agent: Please be advised that Rx refills may take up to 3 business days. We ask that you follow-up with your pharmacy.

## 2023-02-10 ENCOUNTER — Other Ambulatory Visit: Payer: Self-pay | Admitting: Family Medicine

## 2023-02-10 MED ORDER — CLOPIDOGREL BISULFATE 75 MG PO TABS: 75.0000 mg | ORAL_TABLET | Freq: Every morning | ORAL | 3 refills | Status: DC

## 2023-02-10 MED ORDER — OMEPRAZOLE 40 MG PO CPDR: 40.0000 mg | DELAYED_RELEASE_CAPSULE | Freq: Every day | ORAL | 3 refills | Status: DC

## 2023-02-10 MED ORDER — LEVOTHYROXINE SODIUM 50 MCG PO TABS: 50.0000 ug | ORAL_TABLET | Freq: Every day | ORAL | 3 refills | Status: DC

## 2023-02-10 MED ORDER — DOXAZOSIN MESYLATE 2 MG PO TABS: 2.0000 mg | ORAL_TABLET | Freq: Every day | ORAL | 3 refills | Status: DC

## 2023-02-10 NOTE — Telephone Encounter (Signed)
Everlene Other G, DO     Please find out how much potassium she is taking please.Marland KitchenMarland Kitchen

## 2023-02-11 ENCOUNTER — Ambulatory Visit (INDEPENDENT_AMBULATORY_CARE_PROVIDER_SITE_OTHER): Payer: Medicare Other | Admitting: Otolaryngology

## 2023-02-11 ENCOUNTER — Encounter: Payer: Self-pay | Admitting: Family Medicine

## 2023-02-11 ENCOUNTER — Ambulatory Visit (INDEPENDENT_AMBULATORY_CARE_PROVIDER_SITE_OTHER): Payer: Medicare Other | Admitting: Audiology

## 2023-02-11 ENCOUNTER — Encounter (INDEPENDENT_AMBULATORY_CARE_PROVIDER_SITE_OTHER): Payer: Self-pay

## 2023-02-11 VITALS — Ht 63.0 in | Wt 197.0 lb

## 2023-02-11 DIAGNOSIS — H906 Mixed conductive and sensorineural hearing loss, bilateral: Secondary | ICD-10-CM | POA: Diagnosis not present

## 2023-02-11 DIAGNOSIS — H9193 Unspecified hearing loss, bilateral: Secondary | ICD-10-CM | POA: Diagnosis not present

## 2023-02-11 DIAGNOSIS — H9313 Tinnitus, bilateral: Secondary | ICD-10-CM | POA: Diagnosis not present

## 2023-02-11 DIAGNOSIS — H6123 Impacted cerumen, bilateral: Secondary | ICD-10-CM | POA: Diagnosis not present

## 2023-02-11 DIAGNOSIS — H903 Sensorineural hearing loss, bilateral: Secondary | ICD-10-CM

## 2023-02-11 NOTE — Progress Notes (Signed)
  [address] [place], [place] [address] [phone]  Audiological Evaluation    Name: Amy Kelly     DOB:   [birth year]      MRN:   [id number]                                                                                     Service Date: 02/11/2023        Patient was referred today for a hearing evaluation by Dr. Karle Barr.   Symptoms Yes Details  Hearing loss  [x]  Patient reported perceiving hearing loss, where the right ear is worse than the left ear.  Tinnitus  []  Patient denied experiencing tinnitus.  Balance problems  [x]  Patient reported intermittent vertigo sensations.  Previous ear surgeries  []  Patient denied any previous ear surgeries.  Family history  []  Patient denied family history of hearing loss.  Amplification  []  Patient denied the use of hearing aids.    Tympanogram: Right ear: Normal external ear canal volume with normal middle ear pressure and low tympanic membrane compliance (Type As). Left ear: Normal external ear canal volume with normal middle ear pressure and tympanic membrane compliance (Type A).    Hearing Evaluation: The audiogram was completed using conventional audiometric techniques under insert earphones with good reliability.   The hearing test results indicate: Right ear: Moderately-severe to profound mixed hearing loss from [phone]. Left ear: Severe to profound mixed hearing loss from [phone].  Speech Audiometry: Right ear- Speech Reception Threshold (SRT) was obtained at 75 dBHL. Left ear- Speech Reception Threshold (SRT) was obtained at 75 dBHL.   Word Recognition Score Tested using NU-6 (MLV) Right ear: 36% was obtained at a presentation level of 85 dBHL which is deemed as poor understanding. Left ear: 24% was obtained at a presentation level of 85 dBHL which is deemed as poor understanding.    Impression:  There is not a significant difference between puretone thresholds between  ears.   Recommendations: Repeat audiogram when changes are perceived or per MD.   Conley Rolls Mollie Rossano, AUD, CCC-A 02/11/23

## 2023-02-11 NOTE — Telephone Encounter (Signed)
Called pt to find out how much potassium she is taking, no answer and can not leave VM

## 2023-02-11 NOTE — Telephone Encounter (Signed)
Copied from CRM [id number]. Topic: General - Other >> Feb 11, 2023  4:01 PM Prudencio Pair wrote: Reason for CRM: Pt's son, Maurine Minister, giving call back. States he received a VM but was in the doctor's office. Advised that nurse was calling to see how much potassium is pt taking. He stated that she is taking 10 ME (cubed), Extended Release.

## 2023-02-12 DIAGNOSIS — F410 Panic disorder [episodic paroxysmal anxiety] without agoraphobia: Secondary | ICD-10-CM | POA: Diagnosis not present

## 2023-02-12 DIAGNOSIS — M6281 Muscle weakness (generalized): Secondary | ICD-10-CM | POA: Diagnosis not present

## 2023-02-12 DIAGNOSIS — E1140 Type 2 diabetes mellitus with diabetic neuropathy, unspecified: Secondary | ICD-10-CM | POA: Diagnosis not present

## 2023-02-12 DIAGNOSIS — Z556 Problems related to health literacy: Secondary | ICD-10-CM | POA: Diagnosis not present

## 2023-02-12 DIAGNOSIS — I509 Heart failure, unspecified: Secondary | ICD-10-CM | POA: Diagnosis not present

## 2023-02-12 DIAGNOSIS — E1122 Type 2 diabetes mellitus with diabetic chronic kidney disease: Secondary | ICD-10-CM | POA: Diagnosis not present

## 2023-02-12 DIAGNOSIS — H9313 Tinnitus, bilateral: Secondary | ICD-10-CM | POA: Insufficient documentation

## 2023-02-12 DIAGNOSIS — N189 Chronic kidney disease, unspecified: Secondary | ICD-10-CM | POA: Diagnosis not present

## 2023-02-12 DIAGNOSIS — Z794 Long term (current) use of insulin: Secondary | ICD-10-CM | POA: Diagnosis not present

## 2023-02-12 DIAGNOSIS — H903 Sensorineural hearing loss, bilateral: Secondary | ICD-10-CM | POA: Insufficient documentation

## 2023-02-12 DIAGNOSIS — M1990 Unspecified osteoarthritis, unspecified site: Secondary | ICD-10-CM | POA: Diagnosis not present

## 2023-02-12 DIAGNOSIS — R262 Difficulty in walking, not elsewhere classified: Secondary | ICD-10-CM | POA: Diagnosis not present

## 2023-02-12 DIAGNOSIS — H6123 Impacted cerumen, bilateral: Secondary | ICD-10-CM | POA: Insufficient documentation

## 2023-02-12 DIAGNOSIS — Z7902 Long term (current) use of antithrombotics/antiplatelets: Secondary | ICD-10-CM | POA: Diagnosis not present

## 2023-02-12 NOTE — Progress Notes (Signed)
Patient ID: Amy Kelly, female   DOB: 13-Jan-1929, 89 y.o.   MRN: [id number]  CC: Bilateral hearing loss, tinnitus  HPI:  Amy Kelly is a 88 y.o. female who presents today with her son.  According to the son, the patient is severely hard of hearing.  She has significant difficulty communicating verbally due to her hearing loss.  She also complains of bilateral tinnitus.  She describes the tinnitus as a constant high-pitched ringing noise.  The patient has never tried hearing aids.  She has no previous otologic surgery.  She also denies any recent otitis media or otitis externa.  Past Medical History:  Diagnosis Date   Allergy    Anxiety and depression    Arthritis    At risk for falls    Cancer [facility])    Cataract    CHF (congestive heart failure) (HCC)    Chronic kidney disease    Coronary artery disease    Diabetes mellitus (HCC) 11/08/2020   Diabetes mellitus without complication (HCC)    GERD (gastroesophageal reflux disease)    History of skin cancer    Right shin   HOH (hard of hearing)    Hyperlipidemia    Hypertension    Hypothyroidism    Neuromuscular disorder (HCC)    Osteoporosis    Oxygen deficiency    Stroke [facility])    Ulcer     Past Surgical History:  Procedure Laterality Date   ABDOMINAL HYSTERECTOMY     BREAST SURGERY     CARDIAC CATHETERIZATION     [phone]   CARDIAC CATHETERIZATION     x 2 stents   CARPAL TUNNEL RELEASE Left    CERVICAL SPINE SURGERY     CHOLECYSTECTOMY  1967   DIAGNOSTIC MAMMOGRAM     dnc     ESOPHAGEAL DILATION N/A 04/09/2022   Procedure: ESOPHAGEAL DILATION;  Surgeon: Dolores Frame, MD;  [facility];  Service: Gastroenterology;  Laterality: N/A;   ESOPHAGOGASTRODUODENOSCOPY (EGD) WITH PROPOFOL N/A 04/09/2022   Procedure: ESOPHAGOGASTRODUODENOSCOPY (EGD) WITH PROPOFOL;  Surgeon: Dolores Frame, MD;  Location: [facility];  Service: Gastroenterology;  Laterality: N/A;  2:30pm, asa 3   EYE  SURGERY     HERNIA REPAIR     HYSTERECTOMY ABDOMINAL WITH SALPINGO-OOPHORECTOMY  1960   MASTECTOMY Right 2012   SKIN CANCER EXCISION     SPINE SURGERY     THORACIC SPINE SURGERY      Family History  Problem Relation Age of Onset   Heart disease Father    Cancer Brother     Social History:  reports that she has never smoked. She has never been exposed to tobacco smoke. She has never used smokeless tobacco. She reports that she does not drink alcohol and does not use drugs.  Allergies:  Allergies  Allergen Reactions   Fesoterodine     Severe dry mouth   Penicillins Diarrhea and Nausea Only   Prednisone Diarrhea and Nausea Only   Solifenacin     Severe dry mouth   Sulfa Antibiotics Diarrhea and Nausea Only    Prior to Admission medications   Medication Sig Start Date End Date Taking? Authorizing Provider  amLODipine (NORVASC) 5 MG tablet Take 1 tablet (5 mg total) by mouth daily. 07/03/22  Yes Cook, Jayce G, DO  Ascorbic Acid (VITAMIN C GUMMIE PO) Take 1,000 mg by mouth in the morning.   Yes [provider]  B Complex-C (B-COMPLEX WITH VITAMIN C) tablet Take 1  tablet by mouth in the morning.   Yes [provider]  Blood Glucose Monitoring Suppl DEVI Use up to 4 times daily to check blood sugar. May substitute to any manufacturer covered by patient's insurance. 07/07/22  Yes Tommie Sams, DO  calcitRIOL (ROCALTROL) 0.25 MCG capsule Take 0.25 mcg by mouth every Monday, Wednesday, and Friday.   Yes [provider]  Calcium-Phosphorus-Vitamin D (CALCIUM GUMMIES PO) Take 2 tablets by mouth in the morning.   Yes [provider]  Cholecalciferol (VITAMIN D3) 125 MCG (5000 UT) TABS Take 5,000 Units by mouth in the morning.   Yes [provider]  clobetasol (TEMOVATE) 0.05 % external solution Apply 1 Application topically in the morning. Applied to the back of the neck 12/03/21  Yes [provider]  clopidogrel (PLAVIX) 75 MG tablet Take  1 tablet (75 mg total) by mouth in the morning. 02/10/23  Yes Cook, Jayce G, DO  doxazosin (CARDURA) 2 MG tablet Take 1 tablet (2 mg total) by mouth at bedtime. 02/10/23  Yes Cook, Jayce G, DO  escitalopram (LEXAPRO) 20 MG tablet Take 1 tablet (20 mg total) by mouth daily. 12/17/22  Yes Cook, Jayce G, DO  estradiol (ESTRACE VAGINAL) 0.1 MG/GM vaginal cream Place 1 Applicatorful vaginally at bedtime. For 2 weeks, then 3 times a week. 02/02/23  Yes Cook, Jayce G, DO  famotidine (PEPCID) 20 MG tablet TAKE 1 TABLET BY MOUTH AT BEDTIME 07/29/22  Yes Dolores Frame, MD  folic acid (FOLVITE) 800 MCG tablet Take 800 mcg by mouth in the morning.   Yes [provider]  furosemide (LASIX) 20 MG tablet Take 20 mg by mouth in the morning. 20 mg + 40 mg=60 mg   Yes [provider]  furosemide (LASIX) 40 MG tablet Take 1 tablet (40 mg total) by mouth every morning. For fluid Patient taking differently: Take 40 mg by mouth every morning. 40 mg + 20 mg= 60 mg 11/11/20  Yes Emokpae, Courage, MD  gabapentin (NEURONTIN) 100 MG capsule 1 capsule in the morning and 2 at night. 12/17/22  Yes Cook, Jayce G, DO  GEMTESA 75 MG TABS TAKE 1 TABLET BY MOUTH DAILY 10/25/22  Yes McKenzie, Mardene Celeste, MD  Glucose Blood (BLOOD GLUCOSE TEST STRIPS) STRP Use up to 4 times daily to check blood sugar. May substitute to any manufacturer covered by patient's insurance. 07/07/22  Yes Cook, Jayce G, DO  insulin glargine (LANTUS SOLOSTAR) 100 UNIT/ML Solostar Pen Inject 30 Units into the skin daily. 11/11/22  Yes Cook, Jayce G, DO  insulin lispro (HUMALOG KWIKPEN) 100 UNIT/ML KwikPen Inject 0.01-0.1 mls (1-10 units total) into the skin TID before meals per sliding scale 01/09/23  Yes Campbell Riches, NP  levothyroxine (SYNTHROID) 50 MCG tablet Take 1 tablet (50 mcg total) by mouth daily before breakfast. 02/10/23  Yes Cook, Jayce G, DO  loratadine (CLARITIN) 10 MG tablet Take 10 mg by mouth in the morning.   Yes  [provider]  Multiple Vitamin (MULTIVITAMIN WITH MINERALS) TABS tablet Take 1 tablet by mouth in the morning.   Yes [provider]  Olopatadine HCl (PATADAY OP) Place 1 drop into both eyes in the morning. Once in the mornings in left eye   Yes [provider]  omeprazole (PRILOSEC) 40 MG capsule Take 1 capsule (40 mg total) by mouth daily before breakfast. 02/10/23  Yes Cook, Jayce G, DO  polyethylene glycol (MIRALAX / GLYCOLAX) 17 g packet Take 17 g  by mouth every other day.   Yes [provider]  rOPINIRole (REQUIP) 2 MG tablet TAKE 1 TABLET BY MOUTH AT BEDTIME 10/30/22  Yes Cook, Jayce G, DO  rosuvastatin (CRESTOR) 40 MG tablet Take 40 mg by mouth at bedtime. 05/12/20  Yes [provider]  vitamin B-12 (CYANOCOBALAMIN) 500 MCG tablet Take 1,000 mcg by mouth in the morning.   Yes [provider]    Height 5\' 3"  (1.6 m), weight 197 lb (89.4 kg). Exam: General: Communicates with difficulty, well nourished, no acute distress. Head: Normocephalic, no evidence injury, no tenderness, facial buttresses intact without stepoff. Face/sinus: No tenderness to palpation and percussion. Facial movement is normal and symmetric. Eyes: PERRL, EOMI. No scleral icterus, conjunctivae clear. Neuro: CN II exam reveals vision grossly intact.  No nystagmus at any point of gaze. Ears: Auricles well formed without lesions.  Right ear cerumen impaction.  The left tympanic membrane and middle ear space are normal.  Nose: External evaluation reveals normal support and skin without lesions.  Dorsum is intact.  Anterior rhinoscopy reveals congested mucosa over anterior aspect of inferior turbinates and intact septum.  No purulence noted. Oral:  Oral cavity and oropharynx are intact, symmetric, without erythema or edema.  Mucosa is moist without lesions. Neck: Full range of motion without pain.  There is no significant lymphadenopathy.  No masses palpable.  Thyroid bed within  normal limits to palpation.  Parotid glands and submandibular glands equal bilaterally without mass.  Trachea is midline. Neuro:  CN 2-12 grossly intact.   Procedure: Right ear cerumen disimpaction Anesthesia: None Description: Under the operating microscope, the cerumen is carefully removed with a combination of cerumen currette, alligator forceps, and suction catheters.  After the cerumen is removed, the TMs are noted to be normal.  No mass, erythema, or lesions. The patient tolerated the procedure well.    Her hearing test shows bilateral severe to profound hearing loss.  Her discrimination score is 36% AD and 24% AS.  Assessment: 1.  Incidental finding of right ear cerumen impaction.  After the disimpaction procedure, both tympanic membranes and middle ear spaces are noted to be normal. 2.  Bilateral severe to profound hearing loss. 3.  Her tinnitus is likely a result of her hearing loss.  Plan: 1.  Otomicroscopy with right ear cerumen disimpaction. 2.  The physical exam findings and the hearing test results are reviewed with the patient and her son. 3.  The strategies to cope with tinnitus, including the use of masker, hearing aids, tinnitus retraining therapy, and avoidance of caffeine and alcohol are discussed. 4.  Based on the severity of her hearing loss and the poor discrimination scores, the patient may be a candidate for cochlear implantation. 5.  The patient would like to consider her options.  If she is interested in cochlear implantation, a referral to an otologist will be arranged as soon as possible.  Kaleyah Labreck W Berklie Dethlefs 02/12/2023, 8:49 AM

## 2023-02-18 ENCOUNTER — Telehealth: Payer: Self-pay | Admitting: *Deleted

## 2023-02-18 ENCOUNTER — Telehealth: Payer: Self-pay | Admitting: Family Medicine

## 2023-02-18 DIAGNOSIS — R262 Difficulty in walking, not elsewhere classified: Secondary | ICD-10-CM | POA: Diagnosis not present

## 2023-02-18 DIAGNOSIS — Z7902 Long term (current) use of antithrombotics/antiplatelets: Secondary | ICD-10-CM | POA: Diagnosis not present

## 2023-02-18 DIAGNOSIS — M6281 Muscle weakness (generalized): Secondary | ICD-10-CM | POA: Diagnosis not present

## 2023-02-18 DIAGNOSIS — Z794 Long term (current) use of insulin: Secondary | ICD-10-CM | POA: Diagnosis not present

## 2023-02-18 DIAGNOSIS — I509 Heart failure, unspecified: Secondary | ICD-10-CM | POA: Diagnosis not present

## 2023-02-18 DIAGNOSIS — N189 Chronic kidney disease, unspecified: Secondary | ICD-10-CM | POA: Diagnosis not present

## 2023-02-18 DIAGNOSIS — F410 Panic disorder [episodic paroxysmal anxiety] without agoraphobia: Secondary | ICD-10-CM | POA: Diagnosis not present

## 2023-02-18 DIAGNOSIS — E1140 Type 2 diabetes mellitus with diabetic neuropathy, unspecified: Secondary | ICD-10-CM | POA: Diagnosis not present

## 2023-02-18 DIAGNOSIS — M1990 Unspecified osteoarthritis, unspecified site: Secondary | ICD-10-CM | POA: Diagnosis not present

## 2023-02-18 DIAGNOSIS — E1122 Type 2 diabetes mellitus with diabetic chronic kidney disease: Secondary | ICD-10-CM | POA: Diagnosis not present

## 2023-02-18 DIAGNOSIS — Z556 Problems related to health literacy: Secondary | ICD-10-CM | POA: Diagnosis not present

## 2023-02-18 NOTE — Telephone Encounter (Signed)
Pt son said to send to [employer] Pt son said if you can to go ahead and do for 90 days because he don't know how long mail order will be on back order

## 2023-02-18 NOTE — Telephone Encounter (Unsigned)
Copied from CRM [id number]. Topic: Clinical - Medication Question >> Feb 18, 2023 11:15 AM Hendricks Limes wrote: Reason for CRM: levothyroxine (SYNTHROID) 50 MCG tablet [[id number]] Is out of stock and the manufacturer says there is a Geneticist, molecular. She has been out of this medication for 3 days and would like a substitute medication to take the place  of this one. Please contact PT with resolution. [phone] Maurine Minister Rollinson(PT's son)

## 2023-02-18 NOTE — Telephone Encounter (Unsigned)
Copied from CRM [id number]. Topic: Clinical - Prescription Issue >> Feb 18, 2023  4:37 PM Fuller Mandril wrote: Reason for CRM: Pt son called to follow up on previous call -  levothyroxine (SYNTHROID) 50 MCG tablet [[id number]] Is out of stock and the manufacturer says there is a Geneticist, molecular for mail order. She has been out of this medication for 3 days and would like a substitute medication to take the place of this one. Please contact PT with resolution. [phone] Maurine Minister Willy(PT's son) Would like alternative or for Rx to be sent to [facility].

## 2023-02-19 ENCOUNTER — Other Ambulatory Visit: Payer: Self-pay | Admitting: Family Medicine

## 2023-02-19 MED ORDER — LEVOTHYROXINE SODIUM 50 MCG PO TABS: 50.0000 ug | ORAL_TABLET | Freq: Every day | ORAL | 3 refills | Status: DC

## 2023-02-19 NOTE — Telephone Encounter (Signed)
Cook, Jayce G, DO   ? ?Rx sent.   ? ?

## 2023-02-24 DIAGNOSIS — N189 Chronic kidney disease, unspecified: Secondary | ICD-10-CM

## 2023-02-24 DIAGNOSIS — Z7902 Long term (current) use of antithrombotics/antiplatelets: Secondary | ICD-10-CM

## 2023-02-24 DIAGNOSIS — M79672 Pain in left foot: Secondary | ICD-10-CM | POA: Diagnosis not present

## 2023-02-24 DIAGNOSIS — Z556 Problems related to health literacy: Secondary | ICD-10-CM

## 2023-02-24 DIAGNOSIS — F410 Panic disorder [episodic paroxysmal anxiety] without agoraphobia: Secondary | ICD-10-CM

## 2023-02-24 DIAGNOSIS — I509 Heart failure, unspecified: Secondary | ICD-10-CM

## 2023-02-24 DIAGNOSIS — M79671 Pain in right foot: Secondary | ICD-10-CM | POA: Diagnosis not present

## 2023-02-24 DIAGNOSIS — M1990 Unspecified osteoarthritis, unspecified site: Secondary | ICD-10-CM

## 2023-02-24 DIAGNOSIS — I130 Hypertensive heart and chronic kidney disease with heart failure and stage 1 through stage 4 chronic kidney disease, or unspecified chronic kidney disease: Secondary | ICD-10-CM

## 2023-02-24 DIAGNOSIS — E1140 Type 2 diabetes mellitus with diabetic neuropathy, unspecified: Secondary | ICD-10-CM | POA: Diagnosis not present

## 2023-02-24 DIAGNOSIS — Z794 Long term (current) use of insulin: Secondary | ICD-10-CM

## 2023-02-24 DIAGNOSIS — E1122 Type 2 diabetes mellitus with diabetic chronic kidney disease: Secondary | ICD-10-CM

## 2023-02-24 DIAGNOSIS — I739 Peripheral vascular disease, unspecified: Secondary | ICD-10-CM | POA: Diagnosis not present

## 2023-03-03 ENCOUNTER — Other Ambulatory Visit: Payer: Self-pay

## 2023-03-03 ENCOUNTER — Telehealth: Payer: Self-pay | Admitting: *Deleted

## 2023-03-03 MED ORDER — POTASSIUM CHLORIDE ER 10 MEQ PO TBCR: 10.0000 meq | EXTENDED_RELEASE_TABLET | Freq: Every day | ORAL | 3 refills | Status: DC

## 2023-03-03 NOTE — Telephone Encounter (Unsigned)
Copied from CRM [id number]. Topic: Clinical - Medication Refill >> Mar 03, 2023 12:34 PM Clayton Bibles wrote: Most Recent Primary Care Visit:  Provider: Tommie Sams  Department: [facility] MED  Visit Type: OFFICE VISIT  Date: 12/17/2022  Medication: Potassium   Has the patient contacted their pharmacy? Yes (Agent: If no, request that the patient contact the pharmacy for the refill. If patient does not wish to contact the pharmacy document the reason why and proceed with request.) (Agent: If yes, when and what did the pharmacy advise?)  Is this the correct pharmacy for this prescription? Yes - [facility]  If no, delete pharmacy and type the correct one.  This is the patient's preferred pharmacy:  [facility] - [place], [place] - [address] [place] [address] Phone: [phone] Fax: [phone]  [facility] - [place], [place] - [address] [facility] [address] [facility] [address] [facility] [address] [place] [address] Phone: [phone] Fax: [phone]   Has the prescription been filled recently? No  Is the patient out of the medication? Yes  Has the patient been seen for an appointment in the last year OR does the patient have an upcoming appointment? Yes  Can we respond through MyChart? No  Agent: Please be advised that Rx refills may take up to 3 business days. We ask that you follow-up with your pharmacy.

## 2023-03-04 NOTE — Telephone Encounter (Signed)
Everlene Other G, DO     I need to know the dose and how much and how often she is taking the potassium

## 2023-03-04 NOTE — Telephone Encounter (Signed)
Potassium dose has been verified and doctor is made aware

## 2023-03-06 ENCOUNTER — Encounter: Payer: Self-pay | Admitting: Neurology

## 2023-03-17 ENCOUNTER — Other Ambulatory Visit: Payer: Self-pay | Admitting: Family Medicine

## 2023-03-20 ENCOUNTER — Ambulatory Visit (INDEPENDENT_AMBULATORY_CARE_PROVIDER_SITE_OTHER): Payer: Medicare Other | Admitting: Family Medicine

## 2023-03-20 DIAGNOSIS — K5900 Constipation, unspecified: Secondary | ICD-10-CM | POA: Diagnosis not present

## 2023-03-20 DIAGNOSIS — N184 Chronic kidney disease, stage 4 (severe): Secondary | ICD-10-CM

## 2023-03-20 DIAGNOSIS — E785 Hyperlipidemia, unspecified: Secondary | ICD-10-CM | POA: Diagnosis not present

## 2023-03-20 DIAGNOSIS — E1165 Type 2 diabetes mellitus with hyperglycemia: Secondary | ICD-10-CM | POA: Diagnosis not present

## 2023-03-20 DIAGNOSIS — I10 Essential (primary) hypertension: Secondary | ICD-10-CM

## 2023-03-20 DIAGNOSIS — E039 Hypothyroidism, unspecified: Secondary | ICD-10-CM

## 2023-03-20 NOTE — Patient Instructions (Signed)
 Xray ordered.  I will order Echo as well.  Labs today.  Follow up in 3 months.

## 2023-03-21 LAB — LIPID PANEL
Chol/HDL Ratio: 2.9 {ratio} (ref 0.0–4.4)
Cholesterol, Total: 120 mg/dL (ref 100–199)
HDL: 42 mg/dL (ref >39)
LDL Chol Calc (NIH): 51 mg/dL (ref 0–99)
Triglycerides: 156 mg/dL — ABNORMAL HIGH (ref 0–149)
VLDL Cholesterol Cal: 27 mg/dL (ref 5–40)

## 2023-03-21 LAB — CBC
Hematocrit: 36.6 % (ref 34.0–46.6)
Hemoglobin: 11.8 g/dL (ref 11.1–15.9)
MCH: 30.5 pg (ref 26.6–33.0)
MCHC: 32.2 g/dL (ref 31.5–35.7)
MCV: 95 fL (ref 79–97)
Platelets: 184 10*3/uL (ref 150–450)
RBC: 3.87 x10E6/uL (ref 3.77–5.28)
RDW: 12.4 % (ref 11.7–15.4)
WBC: 9.4 10*3/uL (ref 3.4–10.8)

## 2023-03-21 LAB — CMP14+EGFR
ALT: 18 [IU]/L (ref 0–32)
AST: 18 [IU]/L (ref 0–40)
Albumin: 3.9 g/dL (ref 3.6–4.6)
Alkaline Phosphatase: 110 [IU]/L (ref 44–121)
BUN/Creatinine Ratio: 20 (ref 12–28)
BUN: 48 mg/dL — ABNORMAL HIGH (ref 10–36)
Bilirubin Total: 0.3 mg/dL (ref 0.0–1.2)
CO2: 27 mmol/L (ref 20–29)
Calcium: 9.6 mg/dL (ref 8.7–10.3)
Chloride: 97 mmol/L (ref 96–106)
Creatinine, Ser: 2.37 mg/dL — ABNORMAL HIGH (ref 0.57–1.00)
Globulin, Total: 2.6 g/dL (ref 1.5–4.5)
Glucose: 224 mg/dL — ABNORMAL HIGH (ref 70–99)
Potassium: 4.6 mmol/L (ref 3.5–5.2)
Sodium: 138 mmol/L (ref 134–144)
Total Protein: 6.5 g/dL (ref 6.0–8.5)
eGFR: 19 mL/min/{1.73_m2} — ABNORMAL LOW (ref >59)

## 2023-03-21 LAB — PARATHYROID HORMONE, INTACT (NO CA): PTH: 32 pg/mL (ref 15–65)

## 2023-03-21 LAB — MICROALBUMIN / CREATININE URINE RATIO
Creatinine, Urine: 41.5 mg/dL
Microalb/Creat Ratio: 54 mg/g{creat} — ABNORMAL HIGH (ref 0–29)
Microalbumin, Urine: 22.5 ug/mL

## 2023-03-21 LAB — TSH: TSH: 5.41 u[IU]/mL — ABNORMAL HIGH (ref 0.450–4.500)

## 2023-03-21 LAB — PHOSPHORUS: Phosphorus: 3.2 mg/dL (ref 3.0–4.3)

## 2023-03-21 LAB — VITAMIN D 25 HYDROXY (VIT D DEFICIENCY, FRACTURES): Vit D, 25-Hydroxy: 65.5 ng/mL (ref 30.0–100.0)

## 2023-03-21 LAB — HEMOGLOBIN A1C
Est. average glucose Bld gHb Est-mCnc: 174 mg/dL
Hgb A1c MFr Bld: 7.7 % — ABNORMAL HIGH (ref 4.8–5.6)

## 2023-03-21 MED ORDER — LEVOTHYROXINE SODIUM 75 MCG PO TABS: 75.0000 ug | ORAL_TABLET | Freq: Every day | ORAL | 1 refills | Status: DC

## 2023-03-21 NOTE — Assessment & Plan Note (Signed)
 A1c returned at 7.7 which is acceptable given advanced age.  Continue current insulin regimen.

## 2023-03-21 NOTE — Assessment & Plan Note (Signed)
 KUB for further evaluation.  Patient likely has underlying constipation which is causing her pain.  MiraLAX daily.

## 2023-03-21 NOTE — Assessment & Plan Note (Signed)
 Stable on amlodipine and Lasix.  Also on Cardura.

## 2023-03-21 NOTE — Progress Notes (Signed)
 Subjective:  Patient ID: Amy Kelly, female    DOB: 1928-12-07  Age: 89 y.o. MRN: [id number]  CC:   Chief Complaint  Patient presents with   Diabetes    Follow up Left leg pain Needs script for mastectomy bras    HPI:  89 year old female with an extensive past medical history including history of stroke, chronic diastolic heart failure, CKD stage IV, hypothyroidism, type 2 diabetes presents for follow-up.  Patient is accompanied by her son today.  She has recently been seen by ear nose and throat.  She has underlying hearing loss.  Cochlear implant has been recommended.  Patient is contemplating whether she wants to go through with this.  Fasting blood sugars appear to be improved.  She continues to have postprandial hyperglycemia.  Needs A1c today.  No hypoglycemia.  Patient reports that she is in need of mastectomy bras.  Have advised that she needs to reach out to a local company and then I will place what ever orders are required.  Patient reports intermittent crampy abdominal pain.  Has a known history of constipation.  No fever.  Hypertension is stable.  Patient Active Problem List   Diagnosis Date Noted   Sensorineural hearing loss, bilateral 02/12/2023   Neuropathy 12/18/2022   Physical deconditioning 12/18/2022   Uncontrolled type 2 diabetes mellitus with hyperglycemia (HCC) 07/07/2022   RLS (restless legs syndrome) 07/07/2022   Chronic diastolic heart failure (HCC) 07/03/2022   Constipation 02/24/2022   Urge incontinence 09/10/2021   CKD (chronic kidney disease), stage IV (HCC) 11/09/2020   HTN (hypertension) 11/08/2020   Hypothyroidism 11/08/2020   Anxiety 11/08/2020   Stroke (HCC) 11/08/2020    Social Hx   Social History   Socioeconomic History   Marital status: Widowed    Spouse name: Not on file   Number of children: Not on file   Years of education: Not on file   Highest education level: 5th grade  Occupational History   Not on file  Tobacco Use    Smoking status: Never    Passive exposure: Never   Smokeless tobacco: Never  Vaping Use   Vaping status: Never Used  Substance and Sexual Activity   Alcohol use: Never   Drug use: Never   Sexual activity: Not Currently    Birth control/protection: None  Other Topics Concern   Not on file  Social History Narrative   Right handed   Social Drivers of Health   Financial Resource Strain: Medium Risk (03/19/2023)   Overall Financial Resource Strain (CARDIA)    Difficulty of Paying Living Expenses: Somewhat hard  Food Insecurity: Food Insecurity Present (03/19/2023)   Hunger Vital Sign    Worried About Running Out of Food in the Last Year: Sometimes true    Ran Out of Food in the Last Year: Sometimes true  Transportation Needs: No Transportation Needs (03/19/2023)   PRAPARE - Administrator, Civil Service (Medical): No    Lack of Transportation (Non-Medical): No  Physical Activity: Inactive (03/19/2023)   Exercise Vital Sign    Days of Exercise per Week: 0 days    Minutes of Exercise per Session: 0 min  Stress: No Stress Concern Present (03/19/2023)   [employer] of Occupational Health - Occupational Stress Questionnaire    Feeling of Stress : Only a little  Social Connections: Socially Isolated (03/19/2023)   Social Connection and Isolation Panel [NHANES]    Frequency of Communication with Friends and Family: Twice a week  Frequency of Social Gatherings with Friends and Family: Never    Attends Religious Services: Never    Database Administrator or Organizations: No    Attends Banker Meetings: Never    Marital Status: Widowed    Review of Systems Per HPI  Objective:  BP 134/82   Ht 5' 3 (1.6 m)   BMI 34.90 kg/m      03/20/2023    1:47 PM 02/11/2023    3:00 PM 12/17/2022    1:09 PM  BP/Weight  Systolic BP 134  879  Diastolic BP 82  66  Wt. (Lbs)  197 197  BMI  34.9 kg/m2 34.9 kg/m2    Physical Exam Vitals and nursing note reviewed.   Constitutional:      Appearance: Normal appearance. She is obese.  HENT:     Head: Normocephalic and atraumatic.  Cardiovascular:     Rate and Rhythm: Normal rate and regular rhythm.     Heart sounds: Murmur heard.  Pulmonary:     Effort: Pulmonary effort is normal.     Breath sounds: Normal breath sounds.  Abdominal:     General: There is no distension.     Palpations: Abdomen is soft.     Tenderness: There is no abdominal tenderness.  Neurological:     Mental Status: She is alert.  Psychiatric:        Mood and Affect: Mood normal.        Behavior: Behavior normal.     Lab Results  Component Value Date   WBC 9.4 03/20/2023   HGB 11.8 03/20/2023   HCT 36.6 03/20/2023   PLT 184 03/20/2023   GLUCOSE 224 (H) 03/20/2023   CHOL 120 03/20/2023   TRIG 156 (H) 03/20/2023   HDL 42 03/20/2023   LDLCALC 51 03/20/2023   ALT 18 03/20/2023   AST 18 03/20/2023   NA 138 03/20/2023   K 4.6 03/20/2023   CL 97 03/20/2023   CREATININE 2.37 (H) 03/20/2023   BUN 48 (H) 03/20/2023   CO2 27 03/20/2023   TSH 5.410 (H) 03/20/2023   HGBA1C 7.7 (H) 03/20/2023     Assessment & Plan:   Problem List Items Addressed This Visit       Cardiovascular and Mediastinum   HTN (hypertension)   Stable on amlodipine  and Lasix .  Also on Cardura .        Endocrine   Hypothyroidism   TSH was obtained and was elevated.  Increasing levothyroxine  to 75 mcg.      Relevant Medications   levothyroxine  (SYNTHROID ) 75 MCG tablet   Other Relevant Orders   TSH (Completed)   Uncontrolled type 2 diabetes mellitus with hyperglycemia (HCC)   A1c returned at 7.7 which is acceptable given advanced age.  Continue current insulin  regimen.      Relevant Orders   CMP14+EGFR (Completed)   CBC (Completed)   Hemoglobin A1c (Completed)     Genitourinary   CKD (chronic kidney disease), stage IV (HCC)   Needs close follow-up with nephrology.      Relevant Orders   Microalbumin / creatinine urine ratio  (Completed)   Parathyroid  hormone, intact (no Ca) (Completed)   Phosphorus (Completed)   Vitamin D , 25-hydroxy (Completed)     Other   Constipation   KUB for further evaluation.  Patient likely has underlying constipation which is causing her pain.  MiraLAX  daily.      Relevant Orders   DG Abd 1 View   Other  Visit Diagnoses       Hyperlipidemia, unspecified hyperlipidemia type       Relevant Orders   Lipid panel (Completed)       Meds ordered this encounter  Medications   levothyroxine  (SYNTHROID ) 75 MCG tablet    Sig: Take 1 tablet (75 mcg total) by mouth daily before breakfast.    Dispense:  90 tablet    Refill:  1    Follow-up:  Return in about 3 months (around 06/18/2023).  Jacqulyn Ahle DO [facility]

## 2023-03-21 NOTE — Assessment & Plan Note (Signed)
 TSH was obtained and was elevated.  Increasing levothyroxine to 75 mcg.

## 2023-03-21 NOTE — Assessment & Plan Note (Signed)
 Needs close follow-up with nephrology.

## 2023-03-25 ENCOUNTER — Ambulatory Visit (INDEPENDENT_AMBULATORY_CARE_PROVIDER_SITE_OTHER): Payer: Medicare Other | Admitting: Endocrinology

## 2023-03-25 ENCOUNTER — Encounter: Payer: Self-pay | Admitting: Endocrinology

## 2023-03-25 VITALS — BP 138/60 | HR 64 | Resp 20 | Ht 63.0 in | Wt 205.0 lb

## 2023-03-25 DIAGNOSIS — Z794 Long term (current) use of insulin: Secondary | ICD-10-CM | POA: Diagnosis not present

## 2023-03-25 DIAGNOSIS — E1165 Type 2 diabetes mellitus with hyperglycemia: Secondary | ICD-10-CM

## 2023-03-25 MED ORDER — LINAGLIPTIN 5 MG PO TABS: 5.0000 mg | ORAL_TABLET | Freq: Every day | ORAL | 3 refills | Status: DC

## 2023-03-25 NOTE — Progress Notes (Addendum)
 Outpatient Endocrinology Note Amy Waide, MD   Patient's Name: Amy Kelly    DOB: 1928-06-23    MRN: [id number]                                                    REASON OF VISIT: New consult for type 2 diabetes mellitus  REFERRING PROVIDER: Cook, Jayce G, DO  PCP: Cook, Jayce G, DO  HISTORY OF PRESENT ILLNESS:   Amy Kelly is a 89 y.o. old female with past medical history listed below, is here for new consult  for type 2 diabetes mellitus.   Pertinent Diabetes History: Patient was diagnosed with type 2 diabetes mellitus several years ago, about 20+ years ago.  Patient used to be on metformin and pioglitazone/metformin in the past, probably stopped due to renal insufficiency.  She has been on insulin  therapy started around 2020-2021 timeframe, insulin  dose was adjusted over time.  Patient had hemoglobin A1c of 9.1% consistent with uncontrolled diabetes mellitus and referred to endocrinology for evaluation and management.  Patient is accompanied by son in the clinic today.  Previous diabetes education: ? Yes   Family h/o diabetes mellitus: Type 2 diabetes.    Chronic Diabetes Complications : Retinopathy: yes. Last ophthalmology exam was done on annually, following with ophthalmology regularly.  Nephropathy: yes, microalbuminuria, CKD IV, on ACE/ARB /valsartan. Following with nephrology.  Peripheral neuropathy: yes, on gabapentin , following neurology.  Following with podiatry. Coronary artery disease: ? Yes, has CHF on furosemide . Stroke: yes  Relevant comorbidities and cardiovascular risk factors: Obesity: yes Body mass index is 36.31 kg/m.  Hypertension: Yes  Hyperlipidemia : Yes, on statin   Current / Home Diabetic regimen includes:  Lantus  30 units daily. Humalog  2-8 units mainly before supper.   Prior diabetic medications: Metformin stopped due to renal insufficiency. Actoplus met in the past.   Glycemic data:    Glucose log reviewed.  She has been checking  in the morning fasting and before supper.  Fasting blood sugar in the range of 90 to 130 range.  Rarely blood sugar as low as 85.  Daily blood sugar high up to 180.  Blood sugar before supper 200 - 300 range.  No hypoglycemia.  Hypoglycemia: Patient has no hypoglycemic episodes. Patient has hypoglycemia awareness.  Factors modifying glucose control: 1.  Diabetic diet assessment: 2 meals a day usually.  2.  Staying active or exercising: Limited physical activities.  3.  Medication compliance: compliant all of the time.  # Osteoporosis : managed by PCP.   # Primary hypothyroidism : Currently on levothyroxine  75 mcg daily, levothyroxine  dose was recently increased.  Managed by primary care provider.  Interval history  Presented to evaluate and manage type 2 diabetes mellitus.  Recent hemoglobin A1c improved to 7.7%.  Glucometer data as reviewed above.  REVIEW OF SYSTEMS As per history of present illness.   PAST MEDICAL HISTORY: Past Medical History:  Diagnosis Date   Allergy    Anxiety and depression    Arthritis    At risk for falls    Cancer [facility])    Cataract    CHF (congestive heart failure) (HCC)    Chronic kidney disease    Coronary artery disease    Diabetes mellitus (HCC) 11/08/2020   Diabetes mellitus without complication (HCC)    GERD (gastroesophageal reflux  disease)    History of skin cancer    Right shin   HOH (hard of hearing)    Hyperlipidemia    Hypertension    Hypothyroidism    Neuromuscular disorder (HCC)    Osteoporosis    Oxygen deficiency    Stroke (HCC)    Ulcer     PAST SURGICAL HISTORY: Past Surgical History:  Procedure Laterality Date   ABDOMINAL HYSTERECTOMY     BREAST SURGERY     CARDIAC CATHETERIZATION     [phone]   CARDIAC CATHETERIZATION     x 2 stents   CARPAL TUNNEL RELEASE Left    CERVICAL SPINE SURGERY     CHOLECYSTECTOMY  1967   DIAGNOSTIC MAMMOGRAM     dnc     ESOPHAGEAL DILATION N/A 04/09/2022   Procedure:  ESOPHAGEAL DILATION;  Surgeon: Eartha Angelia Sieving, MD;  Location: [facility];  Service: Gastroenterology;  Laterality: N/A;   ESOPHAGOGASTRODUODENOSCOPY (EGD) WITH PROPOFOL  N/A 04/09/2022   Procedure: ESOPHAGOGASTRODUODENOSCOPY (EGD) WITH PROPOFOL ;  Surgeon: Eartha Angelia Sieving, MD;  Location: [facility];  Service: Gastroenterology;  Laterality: N/A;  2:30pm, asa 3   EYE SURGERY     HERNIA REPAIR     HYSTERECTOMY ABDOMINAL WITH SALPINGO-OOPHORECTOMY  1960   MASTECTOMY Right 2012   SKIN CANCER EXCISION     SPINE SURGERY     THORACIC SPINE SURGERY      ALLERGIES: Allergies  Allergen Reactions   Fesoterodine      Severe dry mouth   Penicillins Diarrhea and Nausea Only   Prednisone Diarrhea and Nausea Only   Solifenacin      Severe dry mouth   Sulfa Antibiotics Diarrhea and Nausea Only    FAMILY HISTORY:  Family History  Problem Relation Age of Onset   Heart disease Father    Cancer Brother     SOCIAL HISTORY: Social History   Socioeconomic History   Marital status: Widowed    Spouse name: Not on file   Number of children: Not on file   Years of education: Not on file   Highest education level: 5th grade  Occupational History   Not on file  Tobacco Use   Smoking status: Never    Passive exposure: Never   Smokeless tobacco: Never  Vaping Use   Vaping status: Never Used  Substance and Sexual Activity   Alcohol use: Never   Drug use: Never   Sexual activity: Not Currently    Birth control/protection: None  Other Topics Concern   Not on file  Social History Narrative   Right handed   Social Drivers of Health   Financial Resource Strain: Medium Risk (03/19/2023)   Overall Financial Resource Strain (CARDIA)    Difficulty of Paying Living Expenses: Somewhat hard  Food Insecurity: Food Insecurity Present (03/19/2023)   Hunger Vital Sign    Worried About Running Out of Food in the Last Year: Sometimes true    Ran Out of Food in the Last Year:  Sometimes true  Transportation Needs: No Transportation Needs (03/19/2023)   PRAPARE - Administrator, Civil Service (Medical): No    Lack of Transportation (Non-Medical): No  Physical Activity: Inactive (03/19/2023)   Exercise Vital Sign    Days of Exercise per Week: 0 days    Minutes of Exercise per Session: 0 min  Stress: No Stress Concern Present (03/19/2023)   [employer] of Occupational Health - Occupational Stress Questionnaire    Feeling of Stress : Only a  little  Social Connections: Socially Isolated (03/19/2023)   Social Connection and Isolation Panel [NHANES]    Frequency of Communication with Friends and Family: Twice a week    Frequency of Social Gatherings with Friends and Family: Never    Attends Religious Services: Never    Database Administrator or Organizations: No    Attends Banker Meetings: Never    Marital Status: Widowed    MEDICATIONS:  Current Outpatient Medications  Medication Sig Dispense Refill   amLODipine  (NORVASC ) 5 MG tablet TAKE 1 TABLET BY MOUTH DAILY 90 tablet 3   Ascorbic Acid  (VITAMIN C GUMMIE PO) Take 1,000 mg by mouth in the morning.     B Complex-C (B-COMPLEX WITH VITAMIN C) tablet Take 1 tablet by mouth in the morning.     Blood Glucose Monitoring Suppl DEVI Use up to 4 times daily to check blood sugar. May substitute to any manufacturer covered by patient's insurance. 1 each 0   calcitRIOL (ROCALTROL) 0.25 MCG capsule Take 0.25 mcg by mouth every Monday, Wednesday, and Friday.     Calcium -Phosphorus-Vitamin D  (CALCIUM  GUMMIES PO) Take 2 tablets by mouth in the morning.     Cholecalciferol  (VITAMIN D3) 125 MCG (5000 UT) TABS Take 5,000 Units by mouth in the morning.     clobetasol (TEMOVATE) 0.05 % external solution Apply 1 Application topically in the morning. Applied to the back of the neck     clopidogrel  (PLAVIX ) 75 MG tablet Take 1 tablet (75 mg total) by mouth in the morning. 90 tablet 3   doxazosin  (CARDURA ) 2 MG  tablet Take 1 tablet (2 mg total) by mouth at bedtime. 90 tablet 3   escitalopram  (LEXAPRO ) 20 MG tablet Take 1 tablet (20 mg total) by mouth daily. 90 tablet 1   estradiol  (ESTRACE  VAGINAL) 0.1 MG/GM vaginal cream Place 1 Applicatorful vaginally at bedtime. For 2 weeks, then 3 times a week. 42.5 g 3   famotidine  (PEPCID ) 20 MG tablet TAKE 1 TABLET BY MOUTH AT BEDTIME 90 tablet 3   folic acid  (FOLVITE ) 800 MCG tablet Take 800 mcg by mouth in the morning.     furosemide  (LASIX ) 20 MG tablet Take 20 mg by mouth in the morning. 20 mg + 40 mg=60 mg     furosemide  (LASIX ) 40 MG tablet Take 1 tablet (40 mg total) by mouth every morning. For fluid (Patient taking differently: Take 40 mg by mouth every morning. 40 mg + 20 mg= 60 mg) 30 tablet 5   gabapentin  (NEURONTIN ) 100 MG capsule 1 capsule in the morning and 2 at night. 270 capsule 1   GEMTESA  75 MG TABS TAKE 1 TABLET BY MOUTH DAILY 30 tablet 11   glucose blood (ONETOUCH ULTRA) test strip USE UP TO 4 TIMES DAILY TO CHECK BLOOD SUGAR 400 strip 1   insulin  glargine (LANTUS  SOLOSTAR) 100 UNIT/ML Solostar Pen INJECT 30 UNITS UNDER THE SKIN EVERY DAY 30 mL 1   insulin  lispro (HUMALOG  KWIKPEN) 100 UNIT/ML KwikPen Inject 0.01-0.1 mls (1-10 units total) into the skin TID before meals per sliding scale 15 mL 2   levothyroxine  (SYNTHROID ) 75 MCG tablet Take 1 tablet (75 mcg total) by mouth daily before breakfast. 90 tablet 1   linagliptin  (TRADJENTA ) 5 MG TABS tablet Take 1 tablet (5 mg total) by mouth daily. 90 tablet 3   loratadine (CLARITIN) 10 MG tablet Take 10 mg by mouth in the morning.     Multiple Vitamin (MULTIVITAMIN WITH MINERALS) TABS  tablet Take 1 tablet by mouth in the morning.     Olopatadine HCl (PATADAY OP) Place 1 drop into both eyes in the morning. Once in the mornings in left eye     omeprazole  (PRILOSEC) 40 MG capsule Take 1 capsule (40 mg total) by mouth daily before breakfast. 90 capsule 3   polyethylene glycol (MIRALAX  / GLYCOLAX ) 17 g  packet Take 17 g by mouth every other day.     potassium chloride  (KLOR-CON ) 10 MEQ tablet Take 1 tablet (10 mEq total) by mouth daily. 90 tablet 3   rOPINIRole  (REQUIP ) 2 MG tablet TAKE 1 TABLET BY MOUTH AT BEDTIME 90 tablet 1   rosuvastatin  (CRESTOR ) 40 MG tablet Take 40 mg by mouth at bedtime.     vitamin B-12 (CYANOCOBALAMIN ) 500 MCG tablet Take 1,000 mcg by mouth in the morning.     No current facility-administered medications for this visit.    PHYSICAL EXAM: Vitals:   03/25/23 1327  BP: 138/60  Pulse: 64  Resp: 20  SpO2: 97%  Weight: 205 lb (93 kg)  Height: 5' 3 (1.6 m)   Body mass index is 36.31 kg/m.  Wt Readings from Last 3 Encounters:  03/25/23 205 lb (93 kg)  02/11/23 197 lb (89.4 kg)  12/17/22 197 lb (89.4 kg)    General: Well developed, well nourished female in no apparent distress.  HEENT: AT/[place], no external lesions.  Eyes: Conjunctiva clear and no icterus. Neck: Neck supple  Lungs: Respirations not labored Neurologic: Alert, oriented, normal speech Extremities / Skin: Dry. No sores or rashes noted.  Psychiatric: Does not appear depressed or anxious  Diabetic Foot Exam - Simple   No data filed    LABS Reviewed Lab Results  Component Value Date   HGBA1C 7.7 (H) 03/20/2023   HGBA1C 9.1 (H) 09/16/2022   HGBA1C 7.0 (H) 11/09/2020   No results found for: FRUCTOSAMINE Lab Results  Component Value Date   CHOL 120 03/20/2023   HDL 42 03/20/2023   LDLCALC 51 03/20/2023   TRIG 156 (H) 03/20/2023   CHOLHDL 2.9 03/20/2023   Lab Results  Component Value Date   MICRALBCREAT 54 (H) 03/20/2023   MICRALBCREAT 56 (H) 09/17/2022   Lab Results  Component Value Date   CREATININE 2.37 (H) 03/20/2023   Lab Results  Component Value Date   GFR 18.21 (L) 10/02/2021    ASSESSMENT / PLAN  1. Type 2 diabetes mellitus with hyperglycemia, with long-term current use of insulin  (HCC)     Diabetes Mellitus type 2, complicated by  retinopathy/neuropathy/CKD/PAD. - Diabetic status / severity: Fair control.  Improving.  Lab Results  Component Value Date   HGBA1C 7.7 (H) 03/20/2023    - Hemoglobin A1c goal : <8%  Discussed about type 2 diabetes mellitus.  Based on comorbidities and age it is okay to keep permissible hyperglycemia with a goal of A1c keeping less than 8%, to avoid risks of hypoglycemia.  Antidiabetic medications are limited due to comorbidities especially CKD 4.  Will try to introduce oral antidiabetic medication, and gradually lower dose of insulin .  Will plan for linagliptin , which is suitable in case of having CKD, no need dose adjustment.  If this medicine is not covered we will try other oral antidiabetic medication for example low-dose of sulfonylurea.  Will avoid pioglitazone due to history of CHF.  - Medications: See below.  I) start Tradjenta /linagliptin  5 mg daily. II) decrease Lantus  from 30 to 25 units daily in the morning. III)  Take Humalog  as needed based on sliding scale with before supper as follows.  Mild Sliding Scale Blood Glucose        Insulin  60 -200                   0 units 200-250                 1 units 250-300                 2 units 301-350                 3  units 351-400                 5 units   >400                      6 units and call provider   Okay to continue current dose of insulin  until she gets Tradjenta .  - Home glucose testing: In the morning fasting and before suppertime.  And as needed.  - Discussed/ Gave Hypoglycemia treatment plan.  # Consult : not required at this time.   # Annual urine for microalbuminuria/ creatinine ratio, + microalbuminuria currently, continue ACE/ARB valsartan.  She has CKD 4, following with nephrology. Last  Lab Results  Component Value Date   MICRALBCREAT 54 (H) 03/20/2023    # Foot check nightly / neuropathy, continue gabapentin , following with podiatry and also managed by neurology.  # Annual dilated diabetic  eye exams.   -She is not interested in diet restriction, which is reasonable.  2. Blood pressure  -  BP Readings from Last 1 Encounters:  03/25/23 138/60    - Control is in target.  - No change in current plans.  3. Lipid status / Hyperlipidemia - Last  Lab Results  Component Value Date   LDLCALC 51 03/20/2023   - Continue rosuvastatin  40 mg daily.  Managed by primary care provider.  Diagnoses and all orders for this visit:  Type 2 diabetes mellitus with hyperglycemia, with long-term current use of insulin  (HCC)  Other orders -     linagliptin  (TRADJENTA ) 5 MG TABS tablet; Take 1 tablet (5 mg total) by mouth daily.    DISPOSITION Follow up in clinic in 3  months suggested.   All questions answered and patient verbalized understanding of the plan.  Kimball Manske, MD [facility] Endocrinology [facility] [address] [place], [address] [place], [place] [address] Phone # [phone]  At least part of this note was generated using voice recognition software. Inadvertent word errors may have occurred, which were not recognized during the proofreading process.

## 2023-03-25 NOTE — Patient Instructions (Addendum)
 Diabetes regimen  Once you start taking Trajdenda 5 mg daily change the insulin  regimen as follows.  Decrease Lantus  to 25 units daily.  Take Humalog  as needed based on sliding scale with before supper as follows.  Mild Sliding Scale Blood Glucose        Insulin  60 -200                   0 units 200-250                 1 units 250-300                 2 units 301-350                 3  units 351-400                 5 units   >400                      6 units and call provider

## 2023-03-27 NOTE — Telephone Encounter (Signed)
 Copied from CRM [id number]. Topic: Clinical - Prescription Issue >> Mar 27, 2023 12:00 PM Delon DASEN wrote: Reason for CRM: levothyroxine  (SYNTHROID ) 75 MCG tablet, call from [place] with [employer] order, they are out of stock from the manufacturer that she usually gets, wants to know if they can switch it to manufacturer [employer], please call [phone]

## 2023-03-31 ENCOUNTER — Other Ambulatory Visit: Payer: Self-pay | Admitting: Family Medicine

## 2023-03-31 MED ORDER — INSULIN LISPRO (1 UNIT DIAL) 100 UNIT/ML (KWIKPEN): PEN_INJECTOR | SUBCUTANEOUS | 2 refills | Status: DC

## 2023-04-01 DIAGNOSIS — K080 Exfoliation of teeth due to systemic causes: Secondary | ICD-10-CM | POA: Diagnosis not present

## 2023-04-02 IMAGING — US US EXTREM LOW VENOUS
1 series · 13 of 24 positions shown · non-contrast
Comparison: None.

CLINICAL DATA: Bilateral calf edema and pain



[Series 1: us venous img lower bilat (dvt) · portal-venous · 13 of 78 slices shown]
[im 1/78]
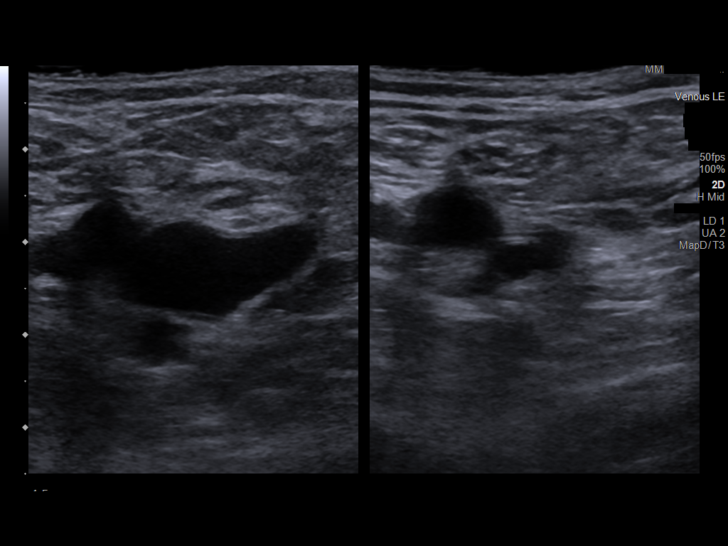
[im 7/78]
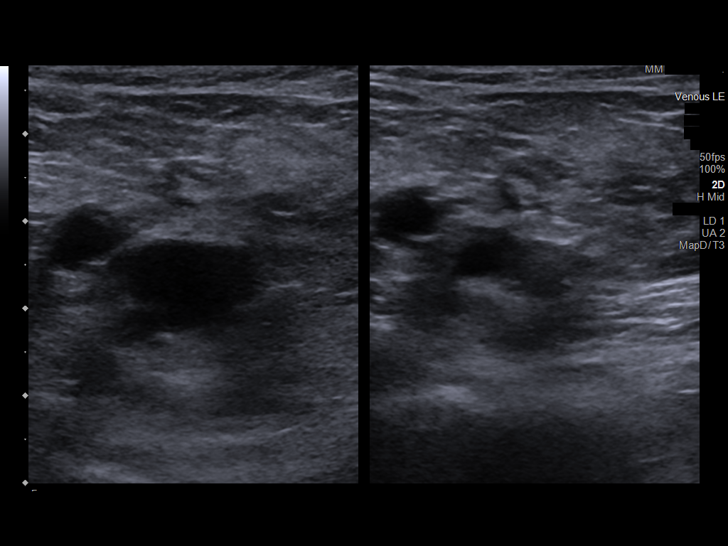
[im 14/78]
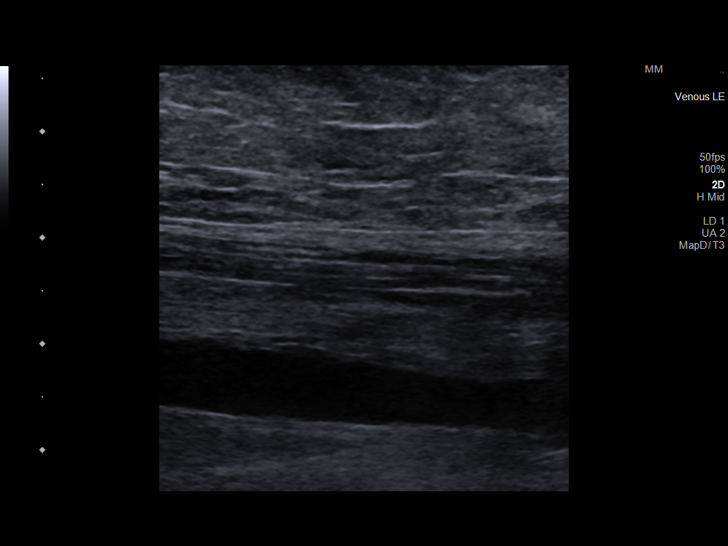
[im 21/78]
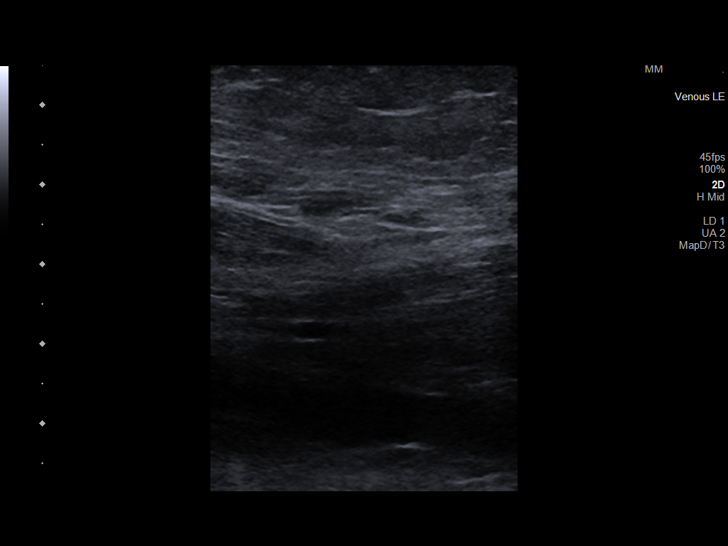
[im 27/78]
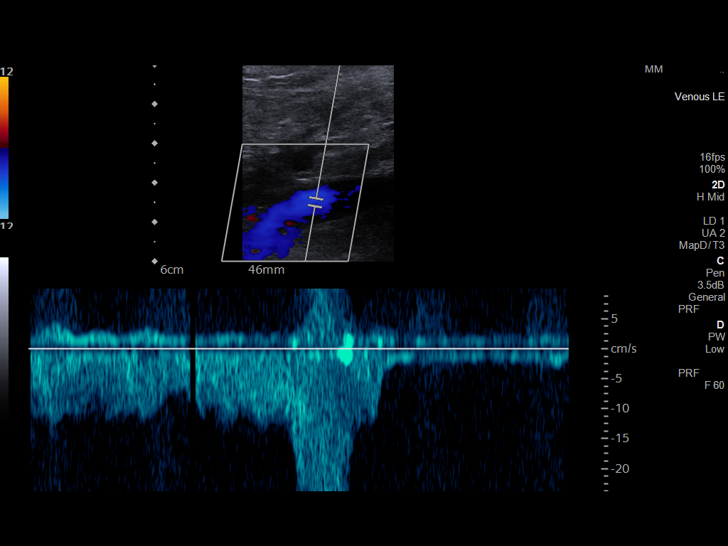
[im 34/78]
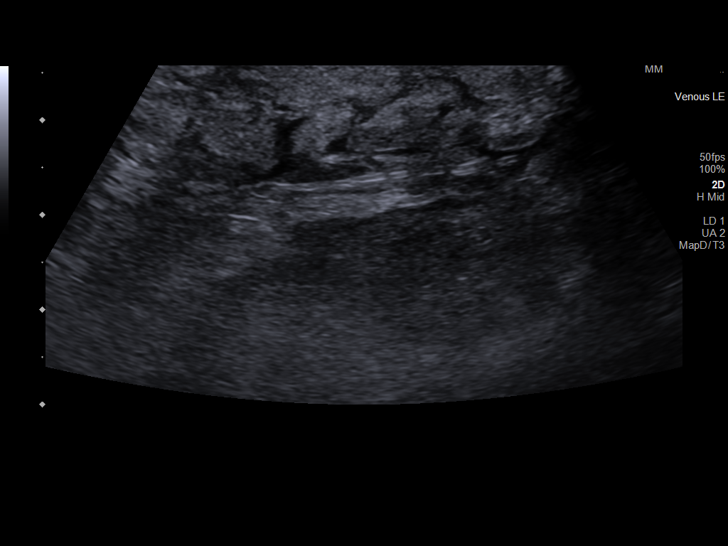
[im 41/78]
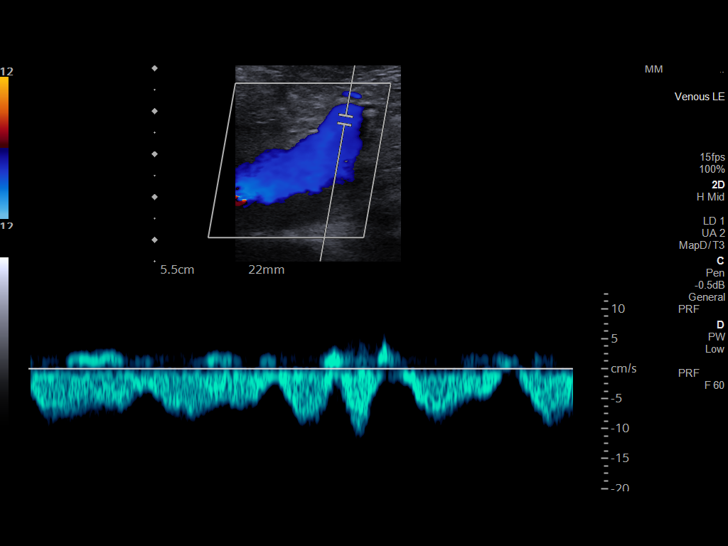
[im 44/78]
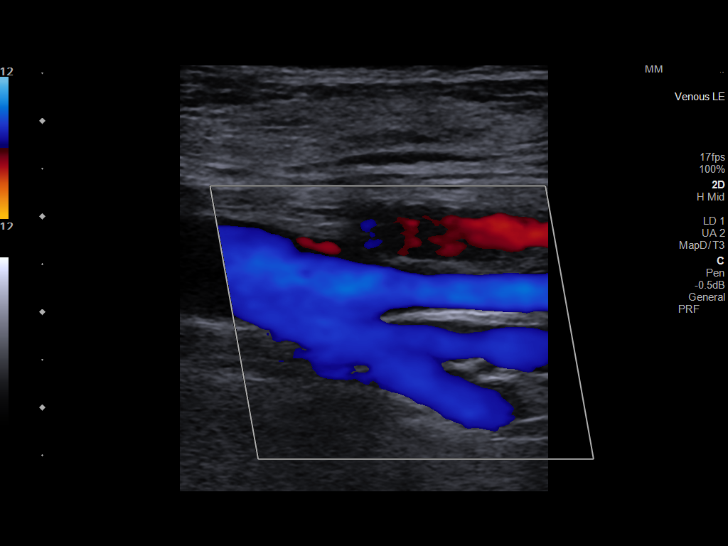
[im 51/78]
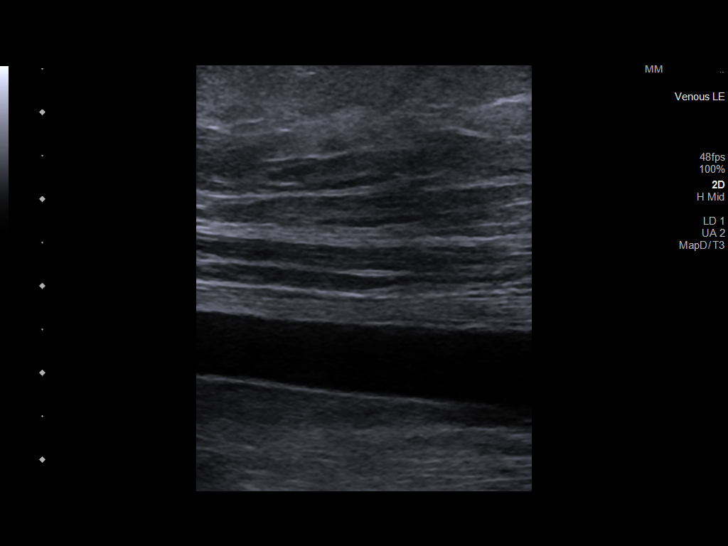
[im 57/78]
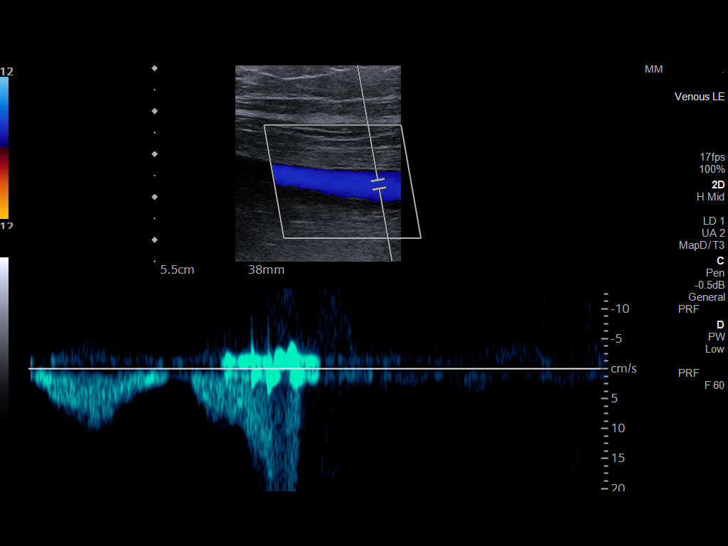
[im 64/78]
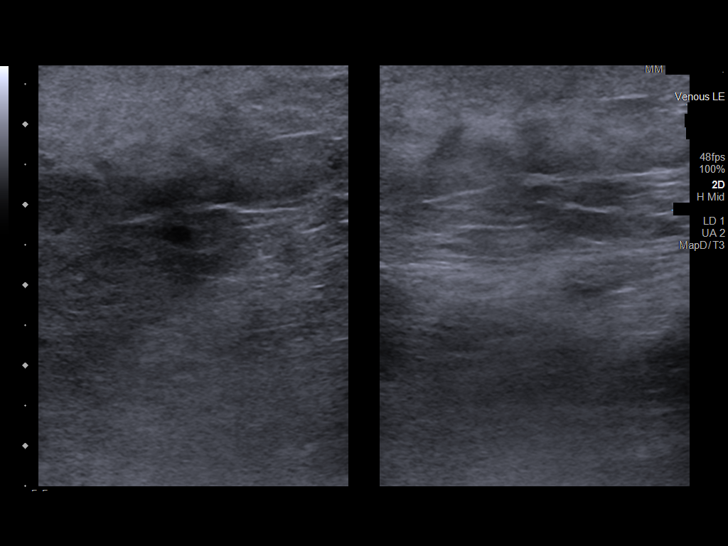
[im 71/78]
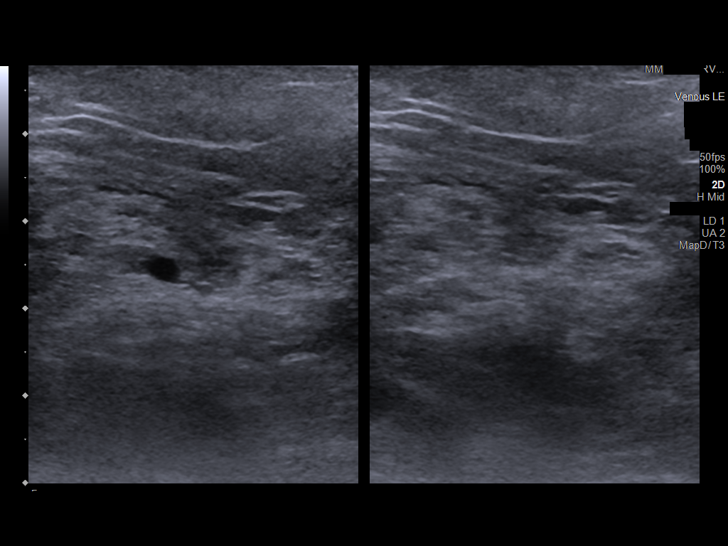
[im 78/78]
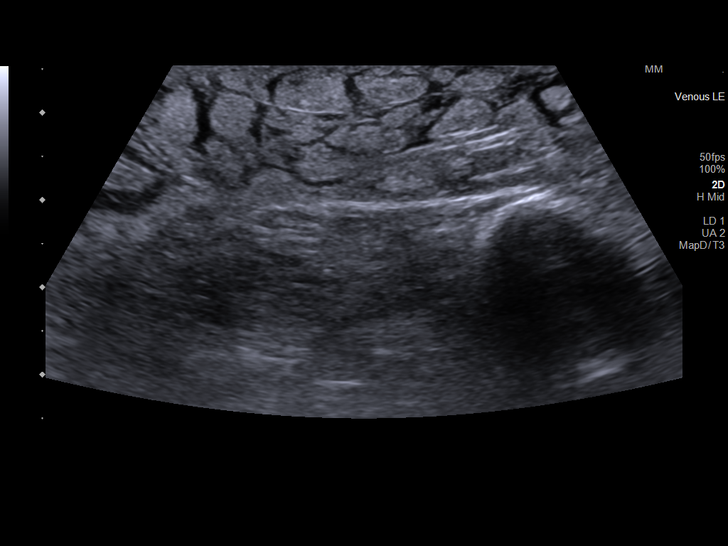

[13 of 24 positions shown; findings below may reference images not displayed]

FINDINGS: RIGHT LOWER EXTREMITY

Common Femoral Vein: No evidence of thrombus. Normal
compressibility, respiratory phasicity and response to augmentation.

Saphenofemoral Junction: No evidence of thrombus. Normal
compressibility and flow on color Doppler imaging.

Profunda Femoral Vein: No evidence of thrombus. Normal
compressibility and flow on color Doppler imaging.

Femoral Vein: No evidence of thrombus. Normal compressibility,
respiratory phasicity and response to augmentation.

Popliteal Vein: No evidence of thrombus. Normal compressibility,
respiratory phasicity and response to augmentation.

Calf Veins: Limited visualization of the calf veins because of body
habitus and peripheral edema. No gross thrombus appreciated.

Superficial Great Saphenous Vein: No evidence of thrombus. Normal
compressibility.

LEFT LOWER EXTREMITY

Common Femoral Vein: No evidence of thrombus. Normal
compressibility, respiratory phasicity and response to augmentation.

Saphenofemoral Junction: No evidence of thrombus. Normal
compressibility and flow on color Doppler imaging.

Profunda Femoral Vein: No evidence of thrombus. Normal
compressibility and flow on color Doppler imaging.

Femoral Vein: No evidence of thrombus. Normal compressibility,
respiratory phasicity and response to augmentation.

Popliteal Vein: No evidence of thrombus. Normal compressibility,
respiratory phasicity and response to augmentation.

Calf Veins: Limited visualization of the calf veins because of body
habitus and edema. No gross thrombus appreciated.

Superficial Great Saphenous Vein: No evidence of thrombus. Normal
compressibility.

Other Findings: Complex septated left popliteal fossa Baker's cyst
measures 3.6 x 2.4 x 3.5 cm
IMPRESSION: Limited exam because of body habitus and peripheral edema.

No significant acute occlusive DVT in either extremity.

3.6 cm left popliteal fossa complex Baker's cyst.

## 2023-04-04 ENCOUNTER — Other Ambulatory Visit: Payer: Self-pay

## 2023-04-04 DIAGNOSIS — R2689 Other abnormalities of gait and mobility: Secondary | ICD-10-CM

## 2023-04-08 ENCOUNTER — Telehealth: Payer: Self-pay | Admitting: Family Medicine

## 2023-04-08 NOTE — Telephone Encounter (Signed)
Please see note below.  Copied from CRM [id number]. Topic: Clinical - Medication Question >> Apr 08, 2023 10:57 AM Prudencio Pair wrote: Reason for CRM: Patient's son, Maurine Minister, called stating that his mom is having some teeth pulled in a few days. States she is on Plavix 75 mg & the dentist wants to know how many days she needs to be off of the Plavix before he can pull her teeth. Please give Maurine Minister a call back to advise. CB #: [phone].

## 2023-04-08 NOTE — Telephone Encounter (Signed)
Spoke with Maurine Minister pt son, stated pt needs to stop plavix 5 days prior to having teeth pulled

## 2023-04-14 ENCOUNTER — Ambulatory Visit: Payer: Medicare Other | Admitting: Urology

## 2023-04-14 VITALS — BP 137/81 | HR 62

## 2023-04-14 DIAGNOSIS — F419 Anxiety disorder, unspecified: Secondary | ICD-10-CM

## 2023-04-14 DIAGNOSIS — Z87440 Personal history of urinary (tract) infections: Secondary | ICD-10-CM

## 2023-04-14 DIAGNOSIS — E1122 Type 2 diabetes mellitus with diabetic chronic kidney disease: Secondary | ICD-10-CM

## 2023-04-14 DIAGNOSIS — I2510 Atherosclerotic heart disease of native coronary artery without angina pectoris: Secondary | ICD-10-CM

## 2023-04-14 DIAGNOSIS — N184 Chronic kidney disease, stage 4 (severe): Secondary | ICD-10-CM

## 2023-04-14 DIAGNOSIS — E785 Hyperlipidemia, unspecified: Secondary | ICD-10-CM

## 2023-04-14 DIAGNOSIS — E11319 Type 2 diabetes mellitus with unspecified diabetic retinopathy without macular edema: Secondary | ICD-10-CM | POA: Diagnosis not present

## 2023-04-14 DIAGNOSIS — E1140 Type 2 diabetes mellitus with diabetic neuropathy, unspecified: Secondary | ICD-10-CM | POA: Diagnosis not present

## 2023-04-14 DIAGNOSIS — I509 Heart failure, unspecified: Secondary | ICD-10-CM

## 2023-04-14 DIAGNOSIS — N3941 Urge incontinence: Secondary | ICD-10-CM

## 2023-04-14 DIAGNOSIS — I130 Hypertensive heart and chronic kidney disease with heart failure and stage 1 through stage 4 chronic kidney disease, or unspecified chronic kidney disease: Secondary | ICD-10-CM

## 2023-04-14 DIAGNOSIS — F32A Depression, unspecified: Secondary | ICD-10-CM

## 2023-04-14 DIAGNOSIS — E1151 Type 2 diabetes mellitus with diabetic peripheral angiopathy without gangrene: Secondary | ICD-10-CM | POA: Diagnosis not present

## 2023-04-14 DIAGNOSIS — E1165 Type 2 diabetes mellitus with hyperglycemia: Secondary | ICD-10-CM | POA: Diagnosis not present

## 2023-04-14 MED ORDER — GEMTESA 75 MG PO TABS: 1.0000 | ORAL_TABLET | Freq: Every day | ORAL | 11 refills | Status: AC

## 2023-04-14 MED ORDER — FLUCONAZOLE 150 MG PO TABS: 150.0000 mg | ORAL_TABLET | Freq: Once | ORAL | 0 refills | Status: AC

## 2023-04-14 NOTE — Progress Notes (Signed)
04/14/2023 4:00 PM   Amy Kelly 23-Sep-1928 [id number]  Referring provider: Tommie Sams, DO [address] [place],  [place] [address]  Followup UTI and OAb  HPI: Ms Amy Kelly is a 89yo here for followup for UTI and OAB. No documented UTIs since last visit. She has intermittent dysuria and itching in her vaginal. She uses gemtesa 75mg  daily for he OAb which works well. No other complaints today   PMH: Past Medical History:  Diagnosis Date   Allergy    Anxiety and depression    Arthritis    At risk for falls    Cancer [name])    Cataract    CHF (congestive heart failure) (HCC)    Chronic kidney disease    Coronary artery disease    Diabetes mellitus (HCC) 11/08/2020   Diabetes mellitus without complication (HCC)    GERD (gastroesophageal reflux disease)    History of skin cancer    Right shin   HOH (hard of hearing)    Hyperlipidemia    Hypertension    Hypothyroidism    Neuromuscular disorder (HCC)    Osteoporosis    Oxygen deficiency    Stroke [facility])    Ulcer     Surgical History: Past Surgical History:  Procedure Laterality Date   ABDOMINAL HYSTERECTOMY     BREAST SURGERY     CARDIAC CATHETERIZATION     [phone]   CARDIAC CATHETERIZATION     x 2 stents   CARPAL TUNNEL RELEASE Left    CERVICAL SPINE SURGERY     CHOLECYSTECTOMY  1967   DIAGNOSTIC MAMMOGRAM     dnc     ESOPHAGEAL DILATION N/A 04/09/2022   Procedure: ESOPHAGEAL DILATION;  Surgeon: Dolores Frame, MD;  [facility];  Service: Gastroenterology;  Laterality: N/A;   ESOPHAGOGASTRODUODENOSCOPY (EGD) WITH PROPOFOL N/A 04/09/2022   Procedure: ESOPHAGOGASTRODUODENOSCOPY (EGD) WITH PROPOFOL;  Surgeon: Dolores Frame, MD;  [facility];  Service: Gastroenterology;  Laterality: N/A;  2:30pm, asa 3   EYE SURGERY     HERNIA REPAIR     HYSTERECTOMY ABDOMINAL WITH SALPINGO-OOPHORECTOMY  1960   MASTECTOMY Right 2012   SKIN CANCER EXCISION     SPINE  SURGERY     THORACIC SPINE SURGERY      Home Medications:  Allergies as of 04/14/2023       Reactions   Fesoterodine    Severe dry mouth   Penicillins Diarrhea, Nausea Only   Prednisone Diarrhea, Nausea Only   Solifenacin    Severe dry mouth   Sulfa Antibiotics Diarrhea, Nausea Only        Medication List        Accurate as of April 14, 2023  4:00 PM. If you have any questions, ask your nurse or doctor.          amLODipine 5 MG tablet Commonly known as: NORVASC TAKE 1 TABLET BY MOUTH DAILY   B-complex with vitamin C tablet Take 1 tablet by mouth in the morning.   Blood Glucose Monitoring Suppl Devi Use up to 4 times daily to check blood sugar. May substitute to any manufacturer covered by patient's insurance.   calcitRIOL 0.25 MCG capsule Commonly known as: ROCALTROL Take 0.25 mcg by mouth every Monday, Wednesday, and Friday.   CALCIUM GUMMIES PO Take 2 tablets by mouth in the morning.   clobetasol 0.05 % external solution Commonly known as: TEMOVATE Apply 1 Application topically in the morning. Applied to the  back of the neck   clopidogrel 75 MG tablet Commonly known as: PLAVIX Take 1 tablet (75 mg total) by mouth in the morning.   cyanocobalamin 500 MCG tablet Commonly known as: VITAMIN B12 Take 1,000 mcg by mouth in the morning.   doxazosin 2 MG tablet Commonly known as: CARDURA Take 1 tablet (2 mg total) by mouth at bedtime.   escitalopram 20 MG tablet Commonly known as: LEXAPRO Take 1 tablet (20 mg total) by mouth daily.   estradiol 0.1 MG/GM vaginal cream Commonly known as: ESTRACE VAGINAL Place 1 Applicatorful vaginally at bedtime. For 2 weeks, then 3 times a week.   famotidine 20 MG tablet Commonly known as: PEPCID TAKE 1 TABLET BY MOUTH AT BEDTIME   folic acid 800 MCG tablet Commonly known as: FOLVITE Take 800 mcg by mouth in the morning.   furosemide 20 MG tablet Commonly known as: LASIX Take 20 mg by mouth in the morning. 20  mg + 40 mg=60 mg What changed: Another medication with the same name was changed. Make sure you understand how and when to take each.   furosemide 40 MG tablet Commonly known as: LASIX Take 1 tablet (40 mg total) by mouth every morning. For fluid What changed: additional instructions   gabapentin 100 MG capsule Commonly known as: NEURONTIN 1 capsule in the morning and 2 at night.   Gemtesa 75 MG Tabs Generic drug: Vibegron TAKE 1 TABLET BY MOUTH DAILY   insulin lispro 100 UNIT/ML KwikPen Commonly known as: HumaLOG KwikPen Inject 0.01-0.1 mls (1-10 units total) into the skin TID before meals per sliding scale   Lantus SoloStar 100 UNIT/ML Solostar Pen Generic drug: insulin glargine INJECT 30 UNITS UNDER THE SKIN EVERY DAY   levothyroxine 75 MCG tablet Commonly known as: SYNTHROID Take 1 tablet (75 mcg total) by mouth daily before breakfast.   linagliptin 5 MG Tabs tablet Commonly known as: TRADJENTA Take 1 tablet (5 mg total) by mouth daily.   loratadine 10 MG tablet Commonly known as: CLARITIN Take 10 mg by mouth in the morning.   multivitamin with minerals Tabs tablet Take 1 tablet by mouth in the morning.   omeprazole 40 MG capsule Commonly known as: PRILOSEC Take 1 capsule (40 mg total) by mouth daily before breakfast.   OneTouch Ultra test strip Generic drug: glucose blood USE UP TO 4 TIMES DAILY TO CHECK BLOOD SUGAR   PATADAY OP Place 1 drop into both eyes in the morning. Once in the mornings in left eye   polyethylene glycol 17 g packet Commonly known as: MIRALAX / GLYCOLAX Take 17 g by mouth every other day.   potassium chloride 10 MEQ tablet Commonly known as: KLOR-CON Take 1 tablet (10 mEq total) by mouth daily.   rOPINIRole 2 MG tablet Commonly known as: REQUIP TAKE 1 TABLET BY MOUTH AT BEDTIME   rosuvastatin 40 MG tablet Commonly known as: CRESTOR Take 40 mg by mouth at bedtime.   VITAMIN C GUMMIE PO Take 1,000 mg by mouth in the  morning.   Vitamin D3 125 MCG (5000 UT) Tabs Take 5,000 Units by mouth in the morning.        Allergies:  Allergies  Allergen Reactions   Fesoterodine     Severe dry mouth   Penicillins Diarrhea and Nausea Only   Prednisone Diarrhea and Nausea Only   Solifenacin     Severe dry mouth   Sulfa Antibiotics Diarrhea and Nausea Only    Family History: Family  History  Problem Relation Age of Onset   Heart disease Father    Cancer Brother     Social History:  reports that she has never smoked. She has never been exposed to tobacco smoke. She has never used smokeless tobacco. She reports that she does not drink alcohol and does not use drugs.  ROS: All other review of systems were reviewed and are negative except what is noted above in HPI  Physical Exam: BP 137/81   Pulse 62   Constitutional:  Alert and oriented, No acute distress. HEENT: [place] AT, moist mucus membranes.  Trachea midline, no masses. Cardiovascular: No clubbing, cyanosis, or edema. Respiratory: Normal respiratory effort, no increased work of breathing. GI: Abdomen is soft, nontender, nondistended, no abdominal masses GU: No CVA tenderness.  Lymph: No cervical or inguinal lymphadenopathy. Skin: No rashes, bruises or suspicious lesions. Neurologic: Grossly intact, no focal deficits, moving all 4 extremities. Psychiatric: Normal mood and affect.  Laboratory Data: Lab Results  Component Value Date   WBC 9.4 03/20/2023   HGB 11.8 03/20/2023   HCT 36.6 03/20/2023   MCV 95 03/20/2023   PLT 184 03/20/2023    Lab Results  Component Value Date   CREATININE 2.37 (H) 03/20/2023    No results found for: "PSA"  No results found for: "TESTOSTERONE"  Lab Results  Component Value Date   HGBA1C 7.7 (H) 03/20/2023    Urinalysis    Component Value Date/Time   COLORURINE YELLOW 01/28/2022 1601   APPEARANCEUR Clear 10/07/2022 1526   LABSPEC 1.005 01/28/2022 1601   PHURINE 5.0 01/28/2022 1601   GLUCOSEU  Negative 10/07/2022 1526   HGBUR LARGE (A) 01/28/2022 1601   BILIRUBINUR negative 12/17/2022 1332   BILIRUBINUR Negative 10/07/2022 1526   KETONESUR negative 12/17/2022 1332   KETONESUR NEGATIVE 01/28/2022 1601   PROTEINUR Negative 10/07/2022 1526   PROTEINUR 100 (A) 01/28/2022 1601   UROBILINOGEN 0.2 12/17/2022 1332   NITRITE Negative 12/17/2022 1332   NITRITE Negative 10/07/2022 1526   NITRITE NEGATIVE 01/28/2022 1601   LEUKOCYTESUR Negative 12/17/2022 1332   LEUKOCYTESUR Negative 10/07/2022 1526   LEUKOCYTESUR LARGE (A) 01/28/2022 1601    Lab Results  Component Value Date   LABMICR 22.5 03/20/2023   WBCUA 0-5 10/08/2021   LABEPIT 0-10 10/08/2021   MUCUS Present 10/08/2021   BACTERIA MANY (A) 01/28/2022    Pertinent Imaging:  No results found for this or any previous visit.  Results for orders placed during the hospital encounter of 11/08/20  US Venous Img Lower Bilateral (DVT)  Narrative CLINICAL DATA:  Bilateral calf edema and pain  EXAM: BILATERAL LOWER EXTREMITY VENOUS DOPPLER ULTRASOUND  TECHNIQUE: Gray-scale sonography with graded compression, as well as color Doppler and duplex ultrasound were performed to evaluate the lower extremity deep venous systems from the level of the common femoral vein and including the common femoral, femoral, profunda femoral, popliteal and calf veins including the posterior tibial, peroneal and gastrocnemius veins when visible. The superficial great saphenous vein was also interrogated. Spectral Doppler was utilized to evaluate flow at rest and with distal augmentation maneuvers in the common femoral, femoral and popliteal veins.  COMPARISON:  None.  FINDINGS: RIGHT LOWER EXTREMITY  Common Femoral Vein: No evidence of thrombus. Normal compressibility, respiratory phasicity and response to augmentation.  Saphenofemoral Junction: No evidence of thrombus. Normal compressibility and flow on color Doppler  imaging.  Profunda Femoral Vein: No evidence of thrombus. Normal compressibility and flow on color Doppler imaging.  Femoral Vein: No evidence  of thrombus. Normal compressibility, respiratory phasicity and response to augmentation.  Popliteal Vein: No evidence of thrombus. Normal compressibility, respiratory phasicity and response to augmentation.  Calf Veins: Limited visualization of the calf veins because of body habitus and peripheral edema. No gross thrombus appreciated.  Superficial Great Saphenous Vein: No evidence of thrombus. Normal compressibility.  LEFT LOWER EXTREMITY  Common Femoral Vein: No evidence of thrombus. Normal compressibility, respiratory phasicity and response to augmentation.  Saphenofemoral Junction: No evidence of thrombus. Normal compressibility and flow on color Doppler imaging.  Profunda Femoral Vein: No evidence of thrombus. Normal compressibility and flow on color Doppler imaging.  Femoral Vein: No evidence of thrombus. Normal compressibility, respiratory phasicity and response to augmentation.  Popliteal Vein: No evidence of thrombus. Normal compressibility, respiratory phasicity and response to augmentation.  Calf Veins: Limited visualization of the calf veins because of body habitus and edema. No gross thrombus appreciated.  Superficial Great Saphenous Vein: No evidence of thrombus. Normal compressibility.  Other Findings: Complex septated left popliteal fossa Baker's cyst measures 3.6 x 2.4 x 3.5 cm  IMPRESSION: Limited exam because of body habitus and peripheral edema.  No significant acute occlusive DVT in either extremity.  3.6 cm left popliteal fossa complex Baker's cyst.   Electronically Signed By: Judie Petit.  Shick M.D. On: 11/11/2020 07:22  No results found for this or any previous visit.  No results found for this or any previous visit.  Results for orders placed during the hospital encounter of 07/24/21  US  RENAL  Narrative CLINICAL DATA:  Acute renal failure with tubular necrosis  Type 2 diabetes  EXAM: RENAL / URINARY TRACT ULTRASOUND COMPLETE  COMPARISON:  07/02/2021  FINDINGS: Right Kidney:  Renal measurements: 10.5 x 4.7 x 5.6 cm = volume: 142 mL. Diffusely thin echogenic cortex. No hydronephrosis. Multiple simple cysts are seen measuring up to 2 cm.  Left Kidney:  Renal measurements: 10.1 x 4.9 x 4.9 cm = volume: 127 mL. Diffusely thinned echogenic cortex. Multiple simple cysts are present measuring up to 1.3 cm.  Bladder:  Appears normal for degree of bladder distention.  Other:  None.  IMPRESSION: Thinned echogenic renal cortices consistent with chronic medical renal disease.   Electronically Signed By: Acquanetta Belling M.D. On: 07/25/2021 14:21  No results found for this or any previous visit.  No results found for this or any previous visit.  No results found for this or any previous visit.   Assessment & Plan:    1. Urge incontinence (Primary) Gemtesa 5mg  daily  2. History of UTI Continue estrace cream Diflucan 150mg  once    No follow-ups on file.  Wilkie Aye, MD  [facility]

## 2023-04-18 ENCOUNTER — Encounter: Payer: Self-pay | Admitting: Urology

## 2023-04-18 NOTE — Patient Instructions (Signed)

## 2023-04-21 ENCOUNTER — Telehealth: Payer: Self-pay | Admitting: *Deleted

## 2023-04-21 ENCOUNTER — Encounter: Payer: Self-pay | Admitting: Family Medicine

## 2023-04-21 NOTE — Telephone Encounter (Signed)
Copied from CRM [id number]. Topic: General - Other >> Apr 21, 2023 11:10 AM Antony Haste wrote: Reason for CRM: Pts son states that the pts dentist needs a notice in writing that it's ok for the pt to be off her blood thinner medication for 5 days for her dental procedure. Pts son said it can be emailed to him at [email or web address]  Callback# [phone]

## 2023-04-21 NOTE — Telephone Encounter (Signed)
Nurses Please connect with family member 1.  We need some additional information.  What is the dental procedure? As for being off the medication if the dentist feels she needs to be off of the medicine in order to do the procedure then that certainly can be done Family needs to be aware that there is a small theoretical risk that stroke could occur when off of blood thinner.  The risk would be small but it is present.  As long as they are aware of this-then we can provide a note It would be helpful to know what type of procedure (or even the dentist's name) thank you

## 2023-04-21 NOTE — Telephone Encounter (Signed)
Son stated he stopped the medication on his own 4 days ago for the procedure in the morning- she is having 3 teeth extracted but they are requiring a note from the doctor that it is ok that she is off her medication for 5 days before procedure. [phone] Fax number- needs it faxed in ASAP in the morning so she doesn't get cancelled

## 2023-04-21 NOTE — Telephone Encounter (Signed)
Copied from CRM [id number]. Topic: General - Other >> Apr 21, 2023  4:35 PM [name] wrote: Reason for CRM: Patients son calling back in about his moms dental note, he said her surgery is tomorrow and he needs this completed. Please call ASAP as he is urgently trying to take care of this and he states he has called 3 times today. [phone]

## 2023-04-21 NOTE — Progress Notes (Signed)
Please fax accordingly.

## 2023-04-21 NOTE — Telephone Encounter (Signed)
I did do a letter.  Please send ASAP in the morning-front should be able to print it off if necessary may stamp my signature on it

## 2023-04-22 DIAGNOSIS — K080 Exfoliation of teeth due to systemic causes: Secondary | ICD-10-CM | POA: Diagnosis not present

## 2023-04-22 NOTE — Telephone Encounter (Addendum)
Provider's letter faxed to dentist's office as requested

## 2023-04-28 ENCOUNTER — Ambulatory Visit (HOSPITAL_COMMUNITY)
Admission: RE | Admit: 2023-04-28 | Discharge: 2023-04-28 | Disposition: A | Discharge status: Home / Self Care | Payer: Medicare Other | Source: Ambulatory Visit | Attending: Family Medicine | Admitting: Family Medicine

## 2023-04-28 DIAGNOSIS — K5900 Constipation, unspecified: Secondary | ICD-10-CM | POA: Insufficient documentation

## 2023-04-28 DIAGNOSIS — D631 Anemia in chronic kidney disease: Secondary | ICD-10-CM | POA: Diagnosis not present

## 2023-04-28 DIAGNOSIS — Z981 Arthrodesis status: Secondary | ICD-10-CM | POA: Diagnosis not present

## 2023-04-28 DIAGNOSIS — N189 Chronic kidney disease, unspecified: Secondary | ICD-10-CM | POA: Diagnosis not present

## 2023-04-28 DIAGNOSIS — R809 Proteinuria, unspecified: Secondary | ICD-10-CM | POA: Diagnosis not present

## 2023-04-28 DIAGNOSIS — R109 Unspecified abdominal pain: Secondary | ICD-10-CM | POA: Diagnosis not present

## 2023-05-01 ENCOUNTER — Other Ambulatory Visit: Payer: Self-pay | Admitting: Family Medicine

## 2023-05-01 MED ORDER — ROSUVASTATIN CALCIUM 40 MG PO TABS: 40.0000 mg | ORAL_TABLET | Freq: Every day | ORAL | 3 refills | Status: DC

## 2023-05-05 DIAGNOSIS — M79671 Pain in right foot: Secondary | ICD-10-CM | POA: Diagnosis not present

## 2023-05-05 DIAGNOSIS — M79672 Pain in left foot: Secondary | ICD-10-CM | POA: Diagnosis not present

## 2023-05-05 DIAGNOSIS — I739 Peripheral vascular disease, unspecified: Secondary | ICD-10-CM | POA: Diagnosis not present

## 2023-05-05 DIAGNOSIS — E1140 Type 2 diabetes mellitus with diabetic neuropathy, unspecified: Secondary | ICD-10-CM | POA: Diagnosis not present

## 2023-05-05 IMAGING — US US RENAL
1 series · 14 of 25 positions shown · non-contrast
Comparison: None.

CLINICAL DATA: Chronic renal disease.

EXAM:
RENAL / URINARY TRACT ULTRASOUND COMPLETE

[Series 1: us renal · 14 of 62 slices shown]
[im 1/62]
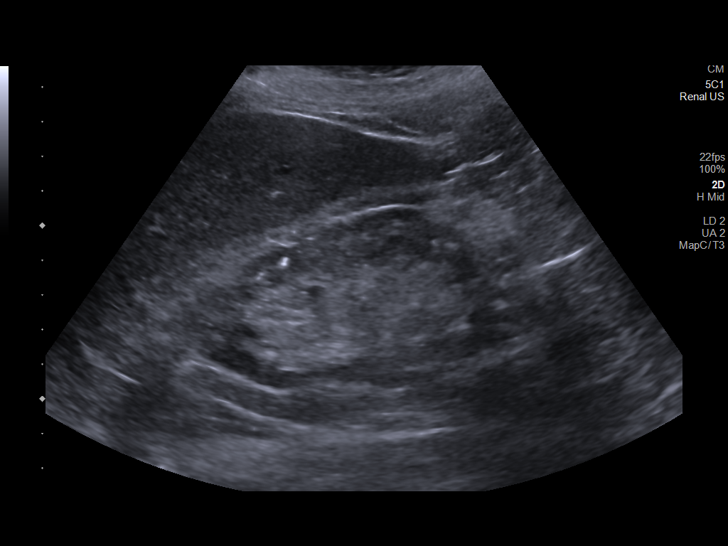
[im 6/62]
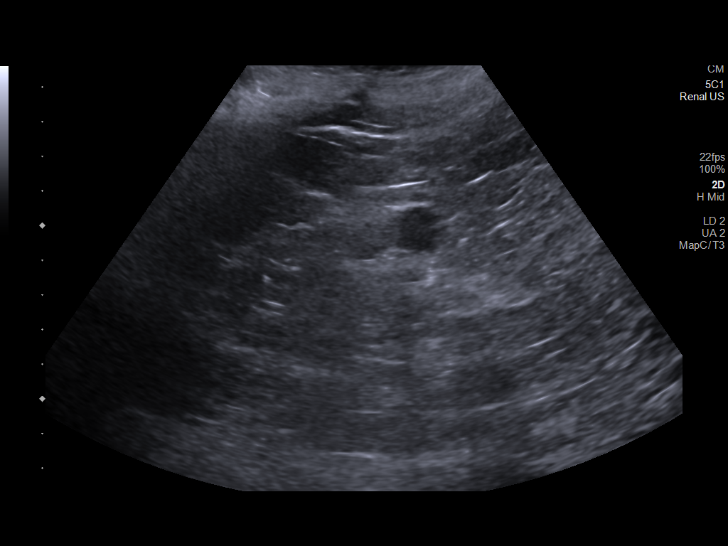
[im 11/62]
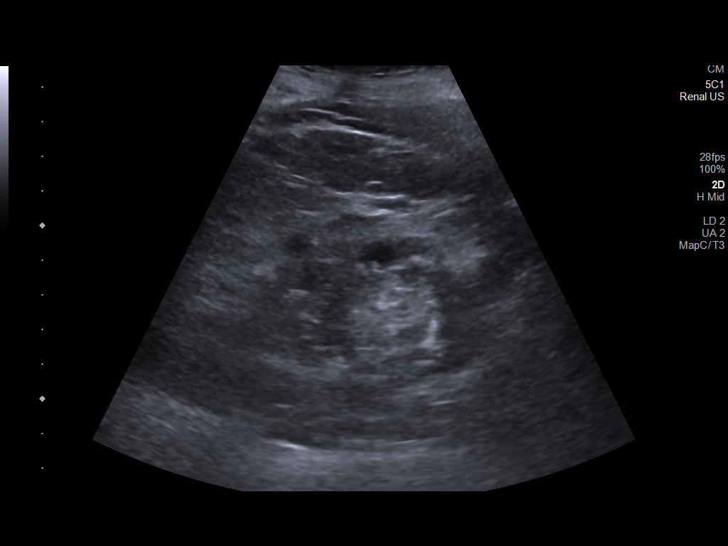
[im 16/62]
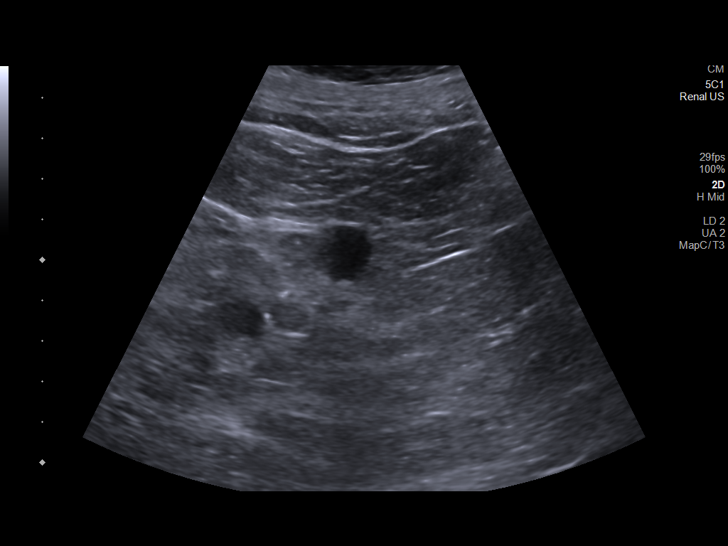
[im 21/62]
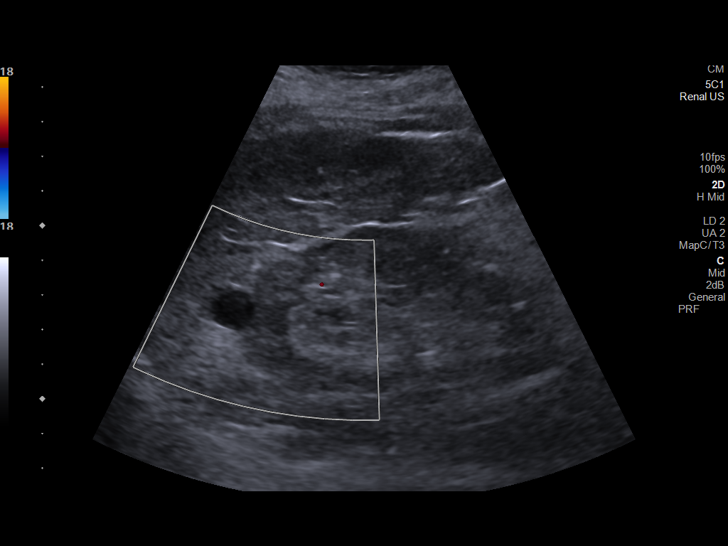
[im 23/62]
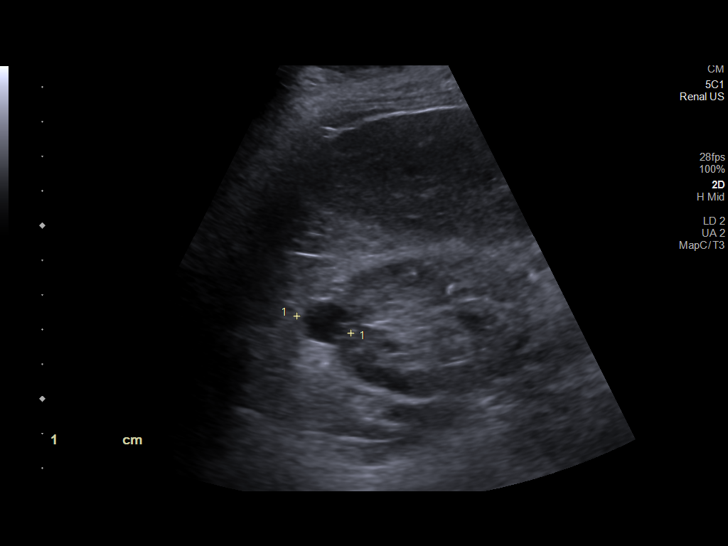
[im 28/62]
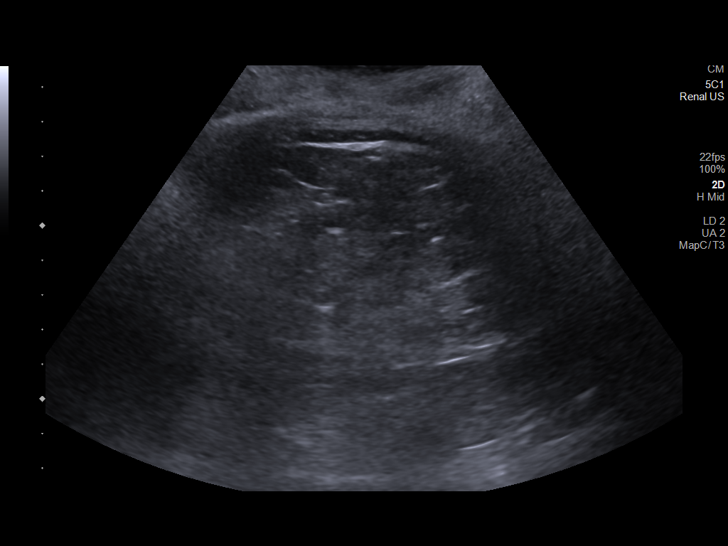
[im 34/62]
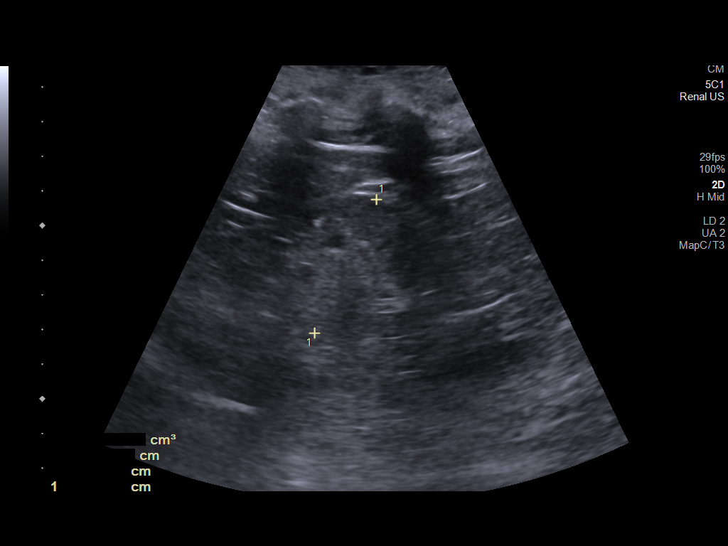
[im 39/62]
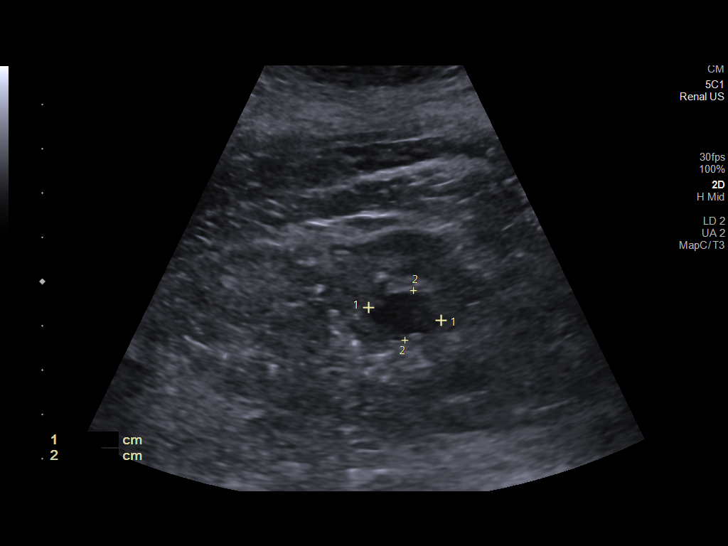
[im 41/62]
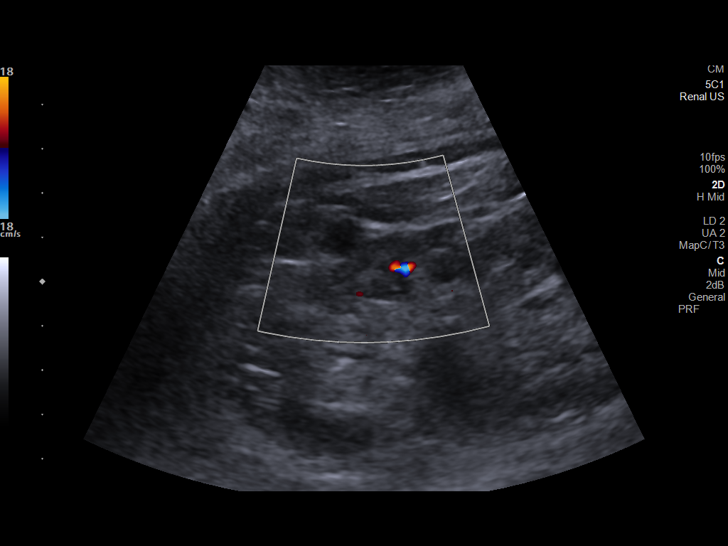
[im 46/62]
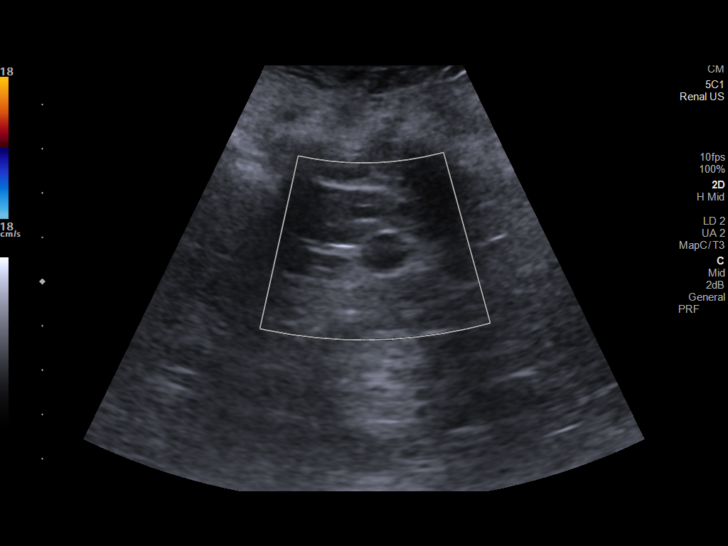
[im 51/62]
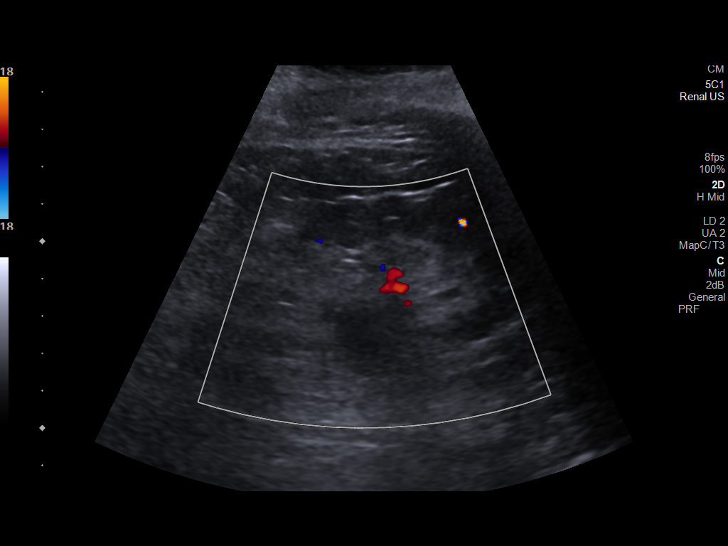
[im 56/62]
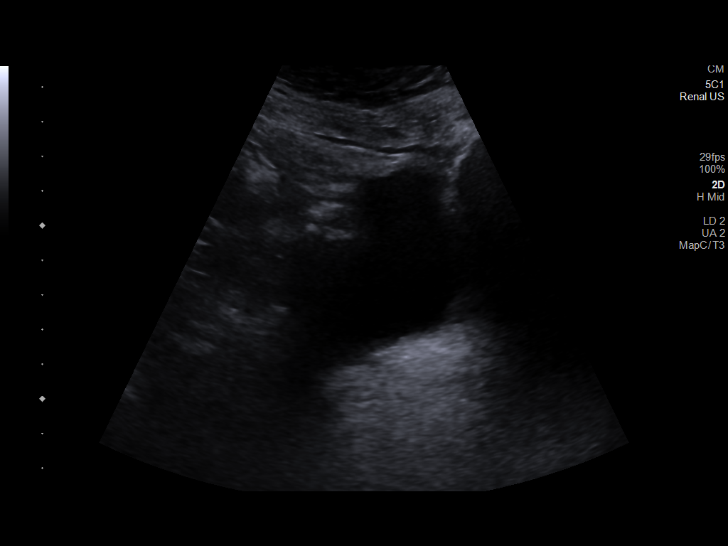
[im 62/62]
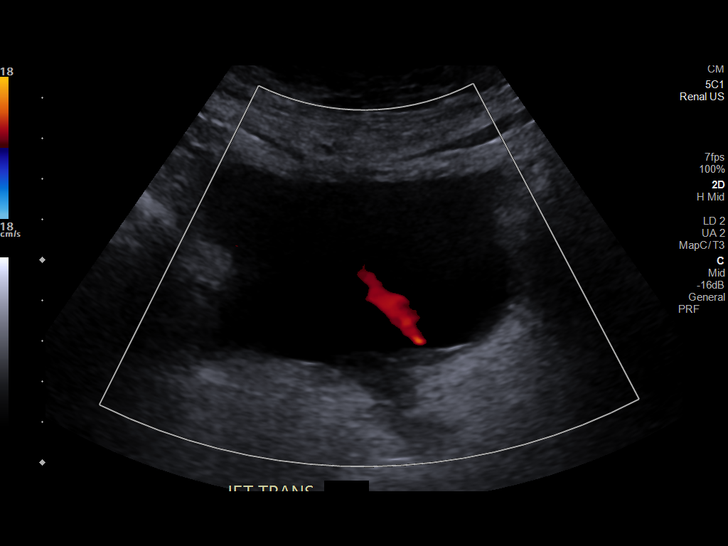

[14 of 25 positions shown; findings below may reference images not displayed]

FINDINGS: Right Kidney:

Renal measurements: 10.2 cm x 4.6 cm x 5.3 cm = volume: 131.7 mL.
Diffusely increased echogenicity of the renal parenchyma is noted.
1.5 cm x 1.4 cm x 1.6 cm and 1.6 cm x 1.4 cm x 1.6 cm anechoic
structures are seen within the mid and upper right kidney. No
abnormal flow is noted within these regions on color Doppler
evaluation. No hydronephrosis is visualized.

Left Kidney:

Renal measurements: 10.1 cm x 5.0 cm x 4.3 cm = volume: 112.6 mL.
Diffusely increased echogenicity of the renal parenchyma is noted.
1.7 cm x 1.1 cm x 1.3 cm and 1.1 cm x 1.0 cm x 1.0 cm anechoic
structures are seen within the mid and upper left kidney. No
abnormal flow is noted within these regions on color Doppler
evaluation. No hydronephrosis is visualized.

Bladder:

Appears normal for degree of bladder distention.

Other:

None.
IMPRESSION: 1. Increased echogenicity of the renal parenchyma, likely secondary
to medical renal disease.
2. Multiple bilateral simple renal cysts.

## 2023-05-06 DIAGNOSIS — K080 Exfoliation of teeth due to systemic causes: Secondary | ICD-10-CM | POA: Diagnosis not present

## 2023-05-22 ENCOUNTER — Other Ambulatory Visit: Payer: Self-pay | Admitting: Family Medicine

## 2023-05-22 MED ORDER — FUROSEMIDE 40 MG PO TABS: 40.0000 mg | ORAL_TABLET | Freq: Every morning | ORAL | 5 refills | Status: DC

## 2023-05-22 NOTE — Telephone Encounter (Signed)
 Copied from CRM [id number]. Topic: Clinical - Medication Refill >> May 22, 2023 11:13 AM Mayer Masker wrote: Most Recent Primary Care Visit:  Provider: Tommie Sams  Department: [facility]  Visit Type: OFFICE VISIT  Date: 03/20/2023  Medication: furosemide (LASIX) 40 MG tablet [[id number]]   Has the patient contacted their pharmacy? Yes (Agent: If no, request that the patient contact the pharmacy for the refill. If patient does not wish to contact the pharmacy document the reason why and proceed with request.) (Agent: If yes, when and what did the pharmacy advise?)  Is this the correct pharmacy for this prescription? Yes If no, delete pharmacy and type the correct one.  This is the patient's preferred pharmacy:  [facility] - [place], [place] - [address] [place] [address] Phone: [phone] Fax: [phone]  [facility] [place], [place] - [address] [place] [address] Phone: [phone] Fax: [phone]   Has the prescription been filled recently? Yes  Is the patient out of the medication? Yes  Has the patient been seen for an appointment in the last year OR does the patient have an upcoming appointment? Yes  Can we respond through MyChart? Yes  Agent: Please be advised that Rx refills may take up to 3 business days. We ask that you follow-up with your pharmacy. Norm from [facility] called in for prescription [phone]

## 2023-05-24 DIAGNOSIS — E1122 Type 2 diabetes mellitus with diabetic chronic kidney disease: Secondary | ICD-10-CM | POA: Diagnosis not present

## 2023-05-24 DIAGNOSIS — N2581 Secondary hyperparathyroidism of renal origin: Secondary | ICD-10-CM | POA: Diagnosis not present

## 2023-05-24 DIAGNOSIS — I5032 Chronic diastolic (congestive) heart failure: Secondary | ICD-10-CM | POA: Diagnosis not present

## 2023-05-24 DIAGNOSIS — I129 Hypertensive chronic kidney disease with stage 1 through stage 4 chronic kidney disease, or unspecified chronic kidney disease: Secondary | ICD-10-CM | POA: Diagnosis not present

## 2023-05-30 ENCOUNTER — Other Ambulatory Visit (INDEPENDENT_AMBULATORY_CARE_PROVIDER_SITE_OTHER): Payer: Self-pay | Admitting: Gastroenterology

## 2023-05-30 ENCOUNTER — Other Ambulatory Visit: Payer: Self-pay | Admitting: Family Medicine

## 2023-06-02 NOTE — Telephone Encounter (Signed)
 Last seen 05/27/2022. Needs office visit.

## 2023-06-05 ENCOUNTER — Other Ambulatory Visit: Payer: Self-pay | Admitting: Family Medicine

## 2023-06-13 ENCOUNTER — Other Ambulatory Visit: Payer: Self-pay | Admitting: Family Medicine

## 2023-06-18 ENCOUNTER — Ambulatory Visit: Payer: Medicare Other | Admitting: Family Medicine

## 2023-06-18 ENCOUNTER — Encounter: Payer: Self-pay | Admitting: Family Medicine

## 2023-06-18 VITALS — BP 131/73 | HR 64 | Temp 97.7°F | Ht 63.0 in | Wt 208.0 lb

## 2023-06-18 DIAGNOSIS — E039 Hypothyroidism, unspecified: Secondary | ICD-10-CM | POA: Diagnosis not present

## 2023-06-18 DIAGNOSIS — E1165 Type 2 diabetes mellitus with hyperglycemia: Secondary | ICD-10-CM | POA: Diagnosis not present

## 2023-06-18 DIAGNOSIS — Z794 Long term (current) use of insulin: Secondary | ICD-10-CM

## 2023-06-18 MED ORDER — FREESTYLE LIBRE 3 READER DEVI: 0 refills | Status: AC

## 2023-06-18 MED ORDER — FREESTYLE LIBRE 3 SENSOR MISC: 3 refills | Status: DC

## 2023-06-18 NOTE — Patient Instructions (Signed)
 Labs ordered.  CGM sent.  Follow up in 3 months.

## 2023-06-19 NOTE — Assessment & Plan Note (Signed)
 A1c ordered.  Continue current medications.  Rx sent for continuous glucometer to help with monitoring of blood sugars.

## 2023-06-19 NOTE — Progress Notes (Signed)
 Subjective:  Patient ID: Amy Kelly, female    DOB: November 17, 1928  Age: 89 y.o. MRN: [id number]  CC:   Chief Complaint  Patient presents with   Follow-up    Patient is not taking insulin as prescribed. Patient is interested in insulin pump due to not being able to remove to take insulin    HPI:  89 year old female presents for follow-up.  Son states that she has fallen a few times recently.  Additionally, she has not been taking her mealtime insulin.  Patient states that she forgets to take this.  She is now following with endocrinology.  Needs A1c.  Last A1c was fairly well-controlled given her advanced age.  She is currently compliant with her basal insulin and linagliptin.  Patient Active Problem List   Diagnosis Date Noted   Sensorineural hearing loss, bilateral 02/12/2023   Neuropathy 12/18/2022   Physical deconditioning 12/18/2022   Uncontrolled type 2 diabetes mellitus with hyperglycemia (HCC) 07/07/2022   RLS (restless legs syndrome) 07/07/2022   Chronic diastolic heart failure (HCC) 07/03/2022   Constipation 02/24/2022   Urge incontinence 09/10/2021   CKD (chronic kidney disease), stage IV (HCC) 11/09/2020   HTN (hypertension) 11/08/2020   Hypothyroidism 11/08/2020   Anxiety 11/08/2020   Stroke (HCC) 11/08/2020    Social Hx   Social History   Socioeconomic History   Marital status: Widowed    Spouse name: Not on file   Number of children: Not on file   Years of education: Not on file   Highest education level: 5th grade  Occupational History   Not on file  Tobacco Use   Smoking status: Never    Passive exposure: Never   Smokeless tobacco: Never  Vaping Use   Vaping status: Never Used  Substance and Sexual Activity   Alcohol use: Never   Drug use: Never   Sexual activity: Not Currently    Birth control/protection: None  Other Topics Concern   Not on file  Social History Narrative   Right handed   Social Drivers of Health   Financial Resource  Strain: Medium Risk (03/19/2023)   Overall Financial Resource Strain (CARDIA)    Difficulty of Paying Living Expenses: Somewhat hard  Food Insecurity: Food Insecurity Present (03/19/2023)   Hunger Vital Sign    Worried About Running Out of Food in the Last Year: Sometimes true    Ran Out of Food in the Last Year: Sometimes true  Transportation Needs: No Transportation Needs (03/19/2023)   PRAPARE - Administrator, Civil Service (Medical): No    Lack of Transportation (Non-Medical): No  Physical Activity: Inactive (03/19/2023)   Exercise Vital Sign    Days of Exercise per Week: 0 days    Minutes of Exercise per Session: 0 min  Stress: No Stress Concern Present (03/19/2023)   [employer] of Occupational Health - Occupational Stress Questionnaire    Feeling of Stress : Only a little  Social Connections: Socially Isolated (03/19/2023)   Social Connection and Isolation Panel [NHANES]    Frequency of Communication with Friends and Family: Twice a week    Frequency of Social Gatherings with Friends and Family: Never    Attends Religious Services: Never    Database administrator or Organizations: No    Attends Banker Meetings: Never    Marital Status: Widowed    Review of Systems Per HPI  Objective:  BP 131/73   Pulse 64   Temp 97.7 F (36.5 C)  Ht 5\' 3"  (1.6 m)   Wt 208 lb (94.3 kg)   SpO2 95%   BMI 36.85 kg/m      06/18/2023    2:16 PM 04/14/2023    3:41 PM 03/25/2023    1:27 PM  BP/Weight  Systolic BP 131 137 138  Diastolic BP 73 81 60  Wt. (Lbs) 208  205  BMI 36.85 kg/m2  36.31 kg/m2    Physical Exam Vitals and nursing note reviewed.  Constitutional:      Appearance: Normal appearance. She is obese.  HENT:     Head: Normocephalic and atraumatic.  Cardiovascular:     Rate and Rhythm: Normal rate and regular rhythm.  Pulmonary:     Effort: Pulmonary effort is normal.     Breath sounds: Normal breath sounds. No wheezing, rhonchi or rales.   Neurological:     Mental Status: She is alert.     Lab Results  Component Value Date   WBC 9.4 03/20/2023   HGB 11.8 03/20/2023   HCT 36.6 03/20/2023   PLT 184 03/20/2023   GLUCOSE 224 (H) 03/20/2023   CHOL 120 03/20/2023   TRIG 156 (H) 03/20/2023   HDL 42 03/20/2023   LDLCALC 51 03/20/2023   ALT 18 03/20/2023   AST 18 03/20/2023   NA 138 03/20/2023   K 4.6 03/20/2023   CL 97 03/20/2023   CREATININE 2.37 (H) 03/20/2023   BUN 48 (H) 03/20/2023   CO2 27 03/20/2023   TSH 5.410 (H) 03/20/2023   HGBA1C 7.7 (H) 03/20/2023     Assessment & Plan:  Uncontrolled type 2 diabetes mellitus with hyperglycemia (HCC) Assessment & Plan: A1c ordered.  Continue current medications.  Rx sent for continuous glucometer to help with monitoring of blood sugars.  Orders: -     Hemoglobin A1c -     Basic metabolic panel with GFR  Hypothyroidism, unspecified type -     TSH  Other orders -     FreeStyle Libre 3 Sensor; Place 1 sensor on the skin every 14 days. Use to check glucose continuously  Dispense: 6 each; Refill: 3 -     FreeStyle Libre 3 Reader; Use to check blood sugar continuously.  Dispense: 1 each; Refill: 0    Follow-up:  3 months  Christin Moline Adriana Simas DO [facility]

## 2023-06-24 ENCOUNTER — Ambulatory Visit (INDEPENDENT_AMBULATORY_CARE_PROVIDER_SITE_OTHER): Payer: Medicare Other | Admitting: Endocrinology

## 2023-06-24 ENCOUNTER — Encounter: Payer: Self-pay | Admitting: Endocrinology

## 2023-06-24 VITALS — BP 126/68 | HR 61 | Ht 63.0 in | Wt 210.0 lb

## 2023-06-24 DIAGNOSIS — E1165 Type 2 diabetes mellitus with hyperglycemia: Secondary | ICD-10-CM

## 2023-06-24 DIAGNOSIS — Z794 Long term (current) use of insulin: Secondary | ICD-10-CM

## 2023-06-24 LAB — POCT GLYCOSYLATED HEMOGLOBIN (HGB A1C): Hemoglobin A1C: 7.2 % — AB (ref 4.0–5.6)

## 2023-06-24 NOTE — Progress Notes (Unsigned)
 Outpatient Endocrinology Note [place] Renad Jenniges, MD   Patient's Name: Amy Kelly    DOB: May 30, 1928    MRN: [id number]                                                    REASON OF VISIT: Follow-up for type 2 diabetes mellitus  REFERRING PROVIDER: Tommie Sams, DO  PCP: Tommie Sams, DO  HISTORY OF PRESENT ILLNESS:   Amy Kelly is a 89 y.o. old female with past medical history listed below, is here for follow-up for type 2 diabetes mellitus.   Pertinent Diabetes History: Patient was diagnosed with type 2 diabetes mellitus several years ago, about 20+ years ago.  Patient used to be on metformin and pioglitazone/metformin in the past, probably stopped due to renal insufficiency.  She has been on insulin therapy started around 2020-2021 timeframe, insulin dose was adjusted over time.  Patient had hemoglobin A1c of 9.1% consistent with uncontrolled diabetes mellitus and referred to endocrinology for evaluation and management, initial consult in January 2025.  Previous diabetes education: ? Yes   Family h/o diabetes mellitus: Type 2 diabetes.    Chronic Diabetes Complications : Retinopathy: yes. Last ophthalmology exam was done on annually, following with ophthalmology regularly.  Nephropathy: yes, microalbuminuria, CKD IV, on ACE/ARB /valsartan. Following with nephrology.  Peripheral neuropathy: yes, on gabapentin, following neurology.  Following with podiatry. Coronary artery disease: ? Yes, has CHF on furosemide. Stroke: yes  Relevant comorbidities and cardiovascular risk factors: Obesity: yes Body mass index is 37.2 kg/m.  Hypertension: Yes  Hyperlipidemia : Yes, on statin   Current / Home Diabetic regimen includes:  Lantus 25 units daily. Humalog sliding scale before supper.  Tradjenta 5 mg daily.  Prior diabetic medications: Metformin stopped due to renal insufficiency. Actoplus met in the past.   Glycemic data:   Glucose log reviewed, fasting blood sugar :  156, 170,  143, 117, 134, 142, 103 - 155 ,no hypoiglycemia.   Blood sugar at bedtime 173, 178, 344.   Hypoglycemia: Patient has no hypoglycemic episodes. Patient has hypoglycemia awareness.  Factors modifying glucose control: 1.  Diabetic diet assessment: 2 meals a day usually.  2.  Staying active or exercising: Limited physical activities.  3.  Medication compliance: compliant all of the time.  # Osteoporosis : managed by PCP.   # Primary hypothyroidism : Currently on levothyroxine 75 mcg daily, levothyroxine dose was recently increased.  Managed by primary care provider.  Interval history  Hemoglobin A1c improved to 7.2%.  Diabetes regimen as reviewed above.  She has been rarely using Humalog otherwise regular with Tradjenta and Lantus 25 units daily.  She is accompanied by son in the clinic today.  No other complaints.  REVIEW OF SYSTEMS As per history of present illness.   PAST MEDICAL HISTORY: Past Medical History:  Diagnosis Date   Allergy    Anxiety and depression    Arthritis    At risk for falls    Cancer [facility])    Cataract    CHF (congestive heart failure) (HCC)    Chronic kidney disease    Coronary artery disease    Diabetes mellitus (HCC) 11/08/2020   Diabetes mellitus without complication (HCC)    GERD (gastroesophageal reflux disease)    History of skin cancer    Right shin  HOH (hard of hearing)    Hyperlipidemia    Hypertension    Hypothyroidism    Neuromuscular disorder (HCC)    Osteoporosis    Oxygen deficiency    Stroke (HCC)    Ulcer     PAST SURGICAL HISTORY: Past Surgical History:  Procedure Laterality Date   ABDOMINAL HYSTERECTOMY     BREAST SURGERY     CARDIAC CATHETERIZATION     [phone]   CARDIAC CATHETERIZATION     x 2 stents   CARPAL TUNNEL RELEASE Left    CERVICAL SPINE SURGERY     CHOLECYSTECTOMY  1967   DIAGNOSTIC MAMMOGRAM     dnc     ESOPHAGEAL DILATION N/A 04/09/2022   Procedure: ESOPHAGEAL DILATION;  Surgeon: Dolores Frame, MD;  [facility];  Service: Gastroenterology;  Laterality: N/A;   ESOPHAGOGASTRODUODENOSCOPY (EGD) WITH PROPOFOL N/A 04/09/2022   Procedure: ESOPHAGOGASTRODUODENOSCOPY (EGD) WITH PROPOFOL;  Surgeon: Dolores Frame, MD;  Location: [facility];  Service: Gastroenterology;  Laterality: N/A;  2:30pm, asa 3   EYE SURGERY     HERNIA REPAIR     HYSTERECTOMY ABDOMINAL WITH SALPINGO-OOPHORECTOMY  1960   MASTECTOMY Right 2012   SKIN CANCER EXCISION     SPINE SURGERY     THORACIC SPINE SURGERY      ALLERGIES: Allergies  Allergen Reactions   Fesoterodine     Severe dry mouth   Penicillins Diarrhea and Nausea Only   Prednisone Diarrhea and Nausea Only   Solifenacin     Severe dry mouth   Sulfa Antibiotics Diarrhea and Nausea Only    FAMILY HISTORY:  Family History  Problem Relation Age of Onset   Heart disease Father    Cancer Brother     SOCIAL HISTORY: Social History   Socioeconomic History   Marital status: Widowed    Spouse name: Not on file   Number of children: Not on file   Years of education: Not on file   Highest education level: 5th grade  Occupational History   Not on file  Tobacco Use   Smoking status: Never    Passive exposure: Never   Smokeless tobacco: Never  Vaping Use   Vaping status: Never Used  Substance and Sexual Activity   Alcohol use: Never   Drug use: Never   Sexual activity: Not Currently    Birth control/protection: None  Other Topics Concern   Not on file  Social History Narrative   Right handed   Social Drivers of Health   Financial Resource Strain: Medium Risk (03/19/2023)   Overall Financial Resource Strain (CARDIA)    Difficulty of Paying Living Expenses: Somewhat hard  Food Insecurity: Food Insecurity Present (03/19/2023)   Hunger Vital Sign    Worried About Running Out of Food in the Last Year: Sometimes true    Ran Out of Food in the Last Year: Sometimes true  Transportation Needs: No  Transportation Needs (03/19/2023)   PRAPARE - Administrator, Civil Service (Medical): No    Lack of Transportation (Non-Medical): No  Physical Activity: Inactive (03/19/2023)   Exercise Vital Sign    Days of Exercise per Week: 0 days    Minutes of Exercise per Session: 0 min  Stress: No Stress Concern Present (03/19/2023)   [employer] of Occupational Health - Occupational Stress Questionnaire    Feeling of Stress : Only a little  Social Connections: Socially Isolated (03/19/2023)   Social Connection and Isolation Panel [NHANES]  Frequency of Communication with Friends and Family: Twice a week    Frequency of Social Gatherings with Friends and Family: Never    Attends Religious Services: Never    Database administrator or Organizations: No    Attends Banker Meetings: Never    Marital Status: Widowed    MEDICATIONS:  Current Outpatient Medications  Medication Sig Dispense Refill   amLODipine (NORVASC) 5 MG tablet TAKE 1 TABLET BY MOUTH DAILY 90 tablet 3   Ascorbic Acid (VITAMIN C GUMMIE PO) Take 1,000 mg by mouth in the morning.     B Complex-C (B-COMPLEX WITH VITAMIN C) tablet Take 1 tablet by mouth in the morning.     Blood Glucose Monitoring Suppl DEVI Use up to 4 times daily to check blood sugar. May substitute to any manufacturer covered by patient's insurance. 1 each 0   calcitRIOL (ROCALTROL) 0.25 MCG capsule Take 0.25 mcg by mouth every Monday, Wednesday, and Friday.     Calcium-Phosphorus-Vitamin D (CALCIUM GUMMIES PO) Take 2 tablets by mouth in the morning.     clobetasol (TEMOVATE) 0.05 % external solution Apply 1 Application topically in the morning. Applied to the back of the neck     clopidogrel (PLAVIX) 75 MG tablet Take 1 tablet (75 mg total) by mouth in the morning. 90 tablet 3   Continuous Glucose Receiver (FREESTYLE LIBRE 3 READER) DEVI Use to check blood sugar continuously. 1 each 0   Continuous Glucose Sensor (FREESTYLE LIBRE 3 SENSOR)  MISC Place 1 sensor on the skin every 14 days. Use to check glucose continuously 6 each 3   doxazosin (CARDURA) 2 MG tablet Take 1 tablet (2 mg total) by mouth at bedtime. 90 tablet 3   escitalopram (LEXAPRO) 20 MG tablet TAKE 1 TABLET BY MOUTH DAILY 90 tablet 1   estradiol (ESTRACE VAGINAL) 0.1 MG/GM vaginal cream Place 1 Applicatorful vaginally at bedtime. For 2 weeks, then 3 times a week. 42.5 g 3   famotidine (PEPCID) 20 MG tablet TAKE 1 TABLET BY MOUTH AT BEDTIME 90 tablet 3   folic acid (FOLVITE) 800 MCG tablet Take 800 mcg by mouth in the morning.     furosemide (LASIX) 20 MG tablet Take 20 mg by mouth in the morning. 20 mg + 40 mg=60 mg     furosemide (LASIX) 40 MG tablet Take 1 tablet (40 mg total) by mouth every morning. For fluid 30 tablet 5   gabapentin (NEURONTIN) 100 MG capsule TAKE 1 CAPSULE BY MOUTH IN THE MORNING AND 2 CAPSULES BY MOUTH AT NIGHT 270 capsule 1   glucose blood (ONETOUCH ULTRA) test strip USE UP TO 4 TIMES DAILY TO CHECK BLOOD SUGAR 400 strip 1   insulin glargine (LANTUS SOLOSTAR) 100 UNIT/ML Solostar Pen INJECT 30 UNITS UNDER THE SKIN EVERY DAY 30 mL 1   insulin lispro (HUMALOG KWIKPEN) 100 UNIT/ML KwikPen INJECT 1 TO 10 UNITS UNDER THE SKIN THREE TIMES DAILY BEFORE MEALS PER SLIDING SCALE 30 mL 3   levothyroxine (SYNTHROID) 75 MCG tablet Take 1 tablet (75 mcg total) by mouth daily before breakfast. 90 tablet 1   linagliptin (TRADJENTA) 5 MG TABS tablet Take 1 tablet (5 mg total) by mouth daily. 90 tablet 3   loratadine (CLARITIN) 10 MG tablet Take 10 mg by mouth in the morning.     Multiple Vitamin (MULTIVITAMIN WITH MINERALS) TABS tablet Take 1 tablet by mouth in the morning.     Olopatadine HCl (PATADAY OP) Place 1 drop  into both eyes in the morning. Once in the mornings in left eye     omeprazole (PRILOSEC) 40 MG capsule Take 1 capsule (40 mg total) by mouth daily before breakfast. 90 capsule 3   polyethylene glycol (MIRALAX / GLYCOLAX) 17 g packet Take 17 g by  mouth every other day.     potassium chloride (KLOR-CON) 10 MEQ tablet Take 1 tablet (10 mEq total) by mouth daily. 90 tablet 3   rOPINIRole (REQUIP) 2 MG tablet TAKE 1 TABLET BY MOUTH AT BEDTIME 90 tablet 1   rosuvastatin (CRESTOR) 40 MG tablet Take 1 tablet (40 mg total) by mouth at bedtime. 90 tablet 3   Vibegron (GEMTESA) 75 MG TABS Take 1 tablet (75 mg total) by mouth daily. 30 tablet 11   vitamin B-12 (CYANOCOBALAMIN) 500 MCG tablet Take 1,000 mcg by mouth in the morning.     No current facility-administered medications for this visit.    PHYSICAL EXAM: Vitals:   06/24/23 1436  BP: 126/68  Pulse: 61  SpO2: 92%  Weight: 210 lb (95.3 kg)  Height: 5\' 3"  (1.6 m)    Body mass index is 37.2 kg/m.  Wt Readings from Last 3 Encounters:  06/24/23 210 lb (95.3 kg)  06/18/23 208 lb (94.3 kg)  03/25/23 205 lb (93 kg)    General: Well developed, well nourished female in no apparent distress.  HEENT: AT/[place], no external lesions.  Eyes: Conjunctiva clear and no icterus. Neck: Neck supple  Lungs: Respirations not labored Neurologic: Alert, oriented, normal speech Extremities / Skin: Dry. No sores or rashes noted.  Bilateral pedal edema present. Psychiatric: Does not appear depressed or anxious  Diabetic Foot Exam - Simple   No data filed    LABS Reviewed Lab Results  Component Value Date   HGBA1C 7.2 (A) 06/24/2023   HGBA1C 7.7 (H) 03/20/2023   HGBA1C 9.1 (H) 09/16/2022   No results found for: "FRUCTOSAMINE" Lab Results  Component Value Date   CHOL 120 03/20/2023   HDL 42 03/20/2023   LDLCALC 51 03/20/2023   TRIG 156 (H) 03/20/2023   CHOLHDL 2.9 03/20/2023   Lab Results  Component Value Date   MICRALBCREAT 54 (H) 03/20/2023   MICRALBCREAT 56 (H) 09/17/2022   Lab Results  Component Value Date   CREATININE 2.37 (H) 03/20/2023   Lab Results  Component Value Date   GFR 18.21 (L) 10/02/2021    ASSESSMENT / PLAN  1. Type 2 diabetes mellitus with hyperglycemia,  with long-term current use of insulin (HCC)     Diabetes Mellitus type 2, complicated by retinopathy/neuropathy/CKD/PAD. - Diabetic status / severity: Fair control.  Improving.  Lab Results  Component Value Date   HGBA1C 7.2 (A) 06/24/2023    - Hemoglobin A1c goal : <8%  Diabetes control improving.  - Medications: See below.  No change.  I) continue Tradjenta/linagliptin 5 mg daily. II) continue Lantus 25 units daily in the morning. III) Take Humalog as needed based on sliding scale with before supper as follows.  Mild Sliding Scale Blood Glucose        Insulin 60 -200                   0 units 200-250                 1 units 250-300                 2 units 301-350  3  units 351-400                 5 units   >400                      6 units and call provider   - Home glucose testing: In the morning fasting and before suppertime.  And as needed.  - Discussed/ Gave Hypoglycemia treatment plan.  # Consult : not required at this time.   # Annual urine for microalbuminuria/ creatinine ratio, + microalbuminuria currently, continue ACE/ARB valsartan.  She has CKD 4, following with nephrology. Last  Lab Results  Component Value Date   MICRALBCREAT 54 (H) 03/20/2023    # Foot check nightly / neuropathy, continue gabapentin, following with podiatry and also managed by neurology.  # Annual dilated diabetic eye exams.   -She is not interested in diet restriction, which is reasonable.  2. Blood pressure  -  BP Readings from Last 1 Encounters:  06/24/23 126/68    - Control is in target.  - No change in current plans.  3. Lipid status / Hyperlipidemia - Last  Lab Results  Component Value Date   LDLCALC 51 03/20/2023   - Continue rosuvastatin 40 mg daily.  Managed by primary care provider.  Akisha was seen today for diabetes.  Diagnoses and all orders for this visit:  Type 2 diabetes mellitus with hyperglycemia, with long-term current use of  insulin (HCC) -     POCT glycosylated hemoglobin (Hb A1C)    DISPOSITION Follow up in clinic in 3  months suggested.   All questions answered and patient verbalized understanding of the plan.  [place] Majd Tissue, MD [facility] Endocr[facility] [address] [place], [address] [place], [place] [address] Phone # [phone]  At least part of this note was generated using voice recognition software. Inadvertent word errors may have occurred, which were not recognized during the proofreading process.

## 2023-06-25 ENCOUNTER — Encounter: Payer: Self-pay | Admitting: Endocrinology

## 2023-06-25 DIAGNOSIS — K080 Exfoliation of teeth due to systemic causes: Secondary | ICD-10-CM | POA: Diagnosis not present

## 2023-06-26 ENCOUNTER — Telehealth: Payer: Self-pay | Admitting: *Deleted

## 2023-06-26 NOTE — Telephone Encounter (Unsigned)
 Copied from CRM [id number]. Topic: Clinical - Lab/Test Results >> Jun 26, 2023 10:53 AM Desma Mcgregor wrote: Reason for CRM: Maurine Minister called in about the pt's A1C labs. They have been done and finalized by Dr. Erroll Luna. Wondering if Dr. Adriana Simas can go over it and follow up with him and pt about it.

## 2023-06-27 NOTE — Telephone Encounter (Signed)
 Cook, Jayce G, DO    A1c 7.2 (this is quite good at her age). Continue basal insulin and Tradjenta. Use SSI if needed.

## 2023-06-27 NOTE — Telephone Encounter (Signed)
Son (DPR) notified and verbalized understanding. 

## 2023-07-02 ENCOUNTER — Other Ambulatory Visit: Payer: Self-pay | Admitting: Family Medicine

## 2023-07-02 NOTE — Telephone Encounter (Signed)
 Spoke with son Cornel Diesel on dpr states the rash is located on both lower legs 4 to 5 inches above ankles, blotches are not itching but have small amounts of liquid from them, applies neosporin and bandaids, please advise   Copied from CRM [id number]. Topic: Clinical - Medical Advice >> Jul 02, 2023  2:20 PM Carlatta H wrote: Reason for CRM: Patients son called in to advise that patient now has some break outs on both legs//He was not with his mom and would like a nurse to give him a call back to advised on how to move forward

## 2023-07-04 ENCOUNTER — Ambulatory Visit (INDEPENDENT_AMBULATORY_CARE_PROVIDER_SITE_OTHER): Admitting: Physician Assistant

## 2023-07-04 ENCOUNTER — Encounter: Payer: Self-pay | Admitting: Physician Assistant

## 2023-07-04 VITALS — BP 148/81 | HR 66 | Temp 97.7°F | Ht 63.0 in | Wt 208.0 lb

## 2023-07-04 DIAGNOSIS — L309 Dermatitis, unspecified: Secondary | ICD-10-CM | POA: Diagnosis not present

## 2023-07-04 DIAGNOSIS — E1165 Type 2 diabetes mellitus with hyperglycemia: Secondary | ICD-10-CM | POA: Diagnosis not present

## 2023-07-04 NOTE — Progress Notes (Signed)
   Acute Office Visit  Subjective:     Patient ID: Amy Kelly, female    DOB: [birth year], 89 y.o.   MRN: [id number]   Patient presents today with concerns for lower extremity rash and episodes of hyperglycemia. She reports rash present for about 2 weeks. She reports associated pruritis. She denies fevers or open wounds. She has been using neosporin ointment as needed. Son reports episodes of hyperglycemia noted by her CGM. He reports spikes are later in the evening. They endorse medication and insulin  compliance.        Review of Systems  Constitutional:  Negative for chills and fever.  Musculoskeletal:  Positive for falls. Negative for joint pain and myalgias.  Skin:  Positive for itching and rash.        Objective:     BP (!) 148/81   Pulse 66   Temp 97.7 F (36.5 C)   Ht 5\' 3"  (1.6 m)   Wt 208 lb (94.3 kg)   SpO2 94%   BMI 36.85 kg/m   Physical Exam Constitutional:      Appearance: Normal appearance.  HENT:     Head: Normocephalic.  Cardiovascular:     Rate and Rhythm: Normal rate and regular rhythm.     Heart sounds: Normal heart sounds. No murmur heard. Pulmonary:     Effort: Pulmonary effort is normal.     Breath sounds: Normal breath sounds.  Musculoskeletal:     Right lower leg: Edema present.     Left lower leg: Edema present.  Skin:    General: Skin is warm and dry.     Findings: Erythema and rash present.     Comments: Bilateral lower leg rash. Erythema and few scabs present. Wounds in later stages of healing.   Neurological:     General: No focal deficit present.     Mental Status: She is alert and oriented to person, place, and time.  Psychiatric:        Mood and Affect: Mood normal.        Behavior: Behavior normal.     No results found for any visits on 07/04/23.      Assessment & Plan:  Dermatitis of lower extremity Assessment & Plan: Bilateral lower leg redness with few scabs in the later stages of healing. No signs of acute  infection. Lower leg edema present today. Advised daily medication compliance with Lasik and keeping legs elevated. Patient may continue using Neosporin on lower leg rash. I advised use of previously prescribed clobetasol as needed. Patient to return for worsening symptoms or fevers.    Uncontrolled type 2 diabetes mellitus with hyperglycemia (HCC) Assessment & Plan: Not Controlled. Hyperglycemia. Son voices concern for sugar spikes. Educated on sliding scale insulin  use. Continue current management.  Advised close follow up with endocrinology as needed.     Return if symptoms worsen or fail to improve.  Jearlean Mince Masiel Gentzler, PA-C

## 2023-07-04 NOTE — Assessment & Plan Note (Signed)
 Bilateral lower leg redness with few scabs in the later stages of healing. No signs of acute infection. Lower leg edema present today. Advised daily medication compliance with Lasik and keeping legs elevated. Patient may continue using Neosporin on lower leg rash. I advised use of previously prescribed clobetasol as needed. Patient to return for worsening symptoms or fevers.

## 2023-07-04 NOTE — Assessment & Plan Note (Signed)
 Not Controlled. Hyperglycemia. Son voices concern for sugar spikes. Educated on sliding scale insulin  use. Continue current management.  Advised close follow up with endocrinology as needed.

## 2023-07-14 DIAGNOSIS — M79671 Pain in right foot: Secondary | ICD-10-CM | POA: Diagnosis not present

## 2023-07-14 DIAGNOSIS — E1140 Type 2 diabetes mellitus with diabetic neuropathy, unspecified: Secondary | ICD-10-CM | POA: Diagnosis not present

## 2023-07-14 DIAGNOSIS — M79672 Pain in left foot: Secondary | ICD-10-CM | POA: Diagnosis not present

## 2023-07-14 DIAGNOSIS — I739 Peripheral vascular disease, unspecified: Secondary | ICD-10-CM | POA: Diagnosis not present

## 2023-07-22 ENCOUNTER — Other Ambulatory Visit: Payer: Self-pay | Admitting: Family Medicine

## 2023-07-22 MED ORDER — FAMOTIDINE 20 MG PO TABS: 20.0000 mg | ORAL_TABLET | Freq: Every day | ORAL | 3 refills | Status: AC

## 2023-07-22 MED ORDER — FUROSEMIDE 40 MG PO TABS: 40.0000 mg | ORAL_TABLET | Freq: Every morning | ORAL | 5 refills | Status: DC

## 2023-07-22 NOTE — Telephone Encounter (Signed)
 Copied from CRM [id number]. Topic: Clinical - Medication Refill >> Jul 22, 2023 10:29 AM Zipporah Him wrote: Most Recent Primary Care Visit:  Provider: Marco Severs  Department: [facility]  Visit Type: OFFICE VISIT  Date: 07/04/2023  Medication: furosemide  (LASIX ) 40 MG tablet, famotidine  (PEPCID ) 20 MG tablet  Has the patient contacted their pharmacy? Yes The pharmacy is calling oh behalf of the patient  Is this the correct pharmacy for this prescription? Yes If no, delete pharmacy and type the correct one.  This is the patient's preferred pharmacy:   [facility] - [place], [place] - [name] Phone: [phone] Fax: [phone]    Has the prescription been filled recently? No  Is the patient out of the medication? Yes  Has the patient been seen for an appointment in the last year OR does the patient have an upcoming appointment? Yes  Can we respond through MyChart? No  Agent: Please be advised that Rx refills may take up to 3 business days. We ask that you follow-up with your pharmacy.

## 2023-07-22 NOTE — Telephone Encounter (Signed)
 Copied from CRM [id number]. Topic: Clinical - Medication Refill >> Jul 22, 2023 10:29 AM Zipporah Him wrote: Most Recent Primary Care Visit:  Provider: Marco Severs  Department: [facility]  Visit Type: OFFICE VISIT  Date: 07/04/2023  Medication: furosemide  (LASIX ) 40 MG tablet, famotidine  (PEPCID ) 20 MG tablet  Has the patient contacted their pharmacy? Yes The pharmacy is calling oh behalf of the patient  Is this the correct pharmacy for this prescription? Yes If no, delete pharmacy and type the correct one.  This is the patient's preferred pharmacy:   [facility] - [place], [place] - [name] [place] [address] Phone: [phone] Fax: [phone]    Has the prescription been filled recently? No  Is the patient out of the medication? Yes  Has the patient been seen for an appointment in the last year OR does the patient have an upcoming appointment? Yes  Can we respond through MyChart? No  Agent: Please be advised that Rx refills may take up to 3 business days. We ask that you follow-up with your pharmacy.

## 2023-07-28 ENCOUNTER — Other Ambulatory Visit: Payer: Self-pay | Admitting: Family Medicine

## 2023-08-06 DIAGNOSIS — K219 Gastro-esophageal reflux disease without esophagitis: Secondary | ICD-10-CM | POA: Diagnosis not present

## 2023-08-06 DIAGNOSIS — I739 Peripheral vascular disease, unspecified: Secondary | ICD-10-CM | POA: Diagnosis not present

## 2023-08-07 ENCOUNTER — Other Ambulatory Visit: Payer: Self-pay | Admitting: Family Medicine

## 2023-08-07 ENCOUNTER — Ambulatory Visit: Payer: Self-pay

## 2023-08-07 NOTE — Telephone Encounter (Signed)
 Copied from CRM [id number]. Topic: Clinical - Red Word Triage >> Aug 07, 2023 11:23 AM Avram MATSU wrote: Red Word that prompted transfer to Nurse Triage: son is calling for open sores on legs. Patient keeps treating it like the provider told her but its not going away.   Chief Complaint: Rash  Symptoms: Rash, Redness, Blistered  Frequency: Chronic  Pertinent Negatives: Patient denies chest pain, dyspnea Disposition: [] ED /[] Urgent Care (no appt availability in office) / [] Appointment(In office/virtual)/ []  [facility]/ [] Home Care/ [] Refused Recommended Disposition /[] [place]/ []  Follow-up with PCP Additional Notes: Spoke with son for triage, the patient is being triaged for a rash.   Answer Assessment - Initial Assessment Questions 1. APPEARANCE of RASH: Describe the rash. (e.g., spots, blisters, raised areas, skin peeling, scaly)     Redness, Bumps,  2. SIZE: How big are the spots? (e.g., tip of pen, eraser, coin; inches, centimeters)     From ankle to above calves  3. LOCATION: Where is the rash located?     Both legs  4. COLOR: What color is the rash? (Note: It is difficult to assess rash color in people with darker-colored skin. When this situation occurs, simply ask the caller to describe what they see.)     Reddened  5. ONSET: When did the rash begin?     Chronic, for months  6. FEVER: Do you have a fever? If Yes, ask: What is your temperature, how was it measured, and when did it start?     No  7. ITCHING: Does the rash itch? If Yes, ask: How bad is the itch? (Scale 1-10; or mild, moderate, severe)     Mild  8. CAUSE: What do you think is causing the rash?     Unsure  9. MEDICINE FACTORS: Have you started any new medicines within the last 2 weeks? (e.g., antibiotics)      No  10. OTHER SYMPTOMS: Do you have any other symptoms? (e.g., dizziness, headache, sore throat, joint pain)       Swelling, Drainage  11.  PREGNANCY: Is there any chance you are pregnant? When was your last menstrual period?       No and No  Protocols used: Rash or Redness - [facility]

## 2023-08-07 NOTE — Telephone Encounter (Signed)
 After listening to the recording, the patient's son Cornel Diesel mentioned the patient takes furosemide  20 mg along with the furosemide  40 mg. He is asking for a 90 day supply to be sent to [facility] mail order.   [facility] - [place], [place] - [address] Phone: [phone]  Fax: [phone]      Copied from CRM [id number]. Topic: Clinical - Prescription Issue >> Aug 07, 2023 11:20 AM Stanly Early wrote: Reason for CRM: pharmacy will send in a request furosemide  (LASIX ) 20 MG tablet 90 day supply.

## 2023-08-08 ENCOUNTER — Ambulatory Visit: Payer: Medicare Other | Admitting: Neurology

## 2023-08-08 ENCOUNTER — Encounter: Payer: Self-pay | Admitting: Family Medicine

## 2023-08-08 ENCOUNTER — Ambulatory Visit (INDEPENDENT_AMBULATORY_CARE_PROVIDER_SITE_OTHER): Admitting: Family Medicine

## 2023-08-08 VITALS — BP 138/58 | HR 73 | Temp 97.7°F | Ht 63.0 in | Wt 210.0 lb

## 2023-08-08 DIAGNOSIS — R270 Ataxia, unspecified: Secondary | ICD-10-CM

## 2023-08-08 DIAGNOSIS — R600 Localized edema: Secondary | ICD-10-CM | POA: Diagnosis not present

## 2023-08-08 DIAGNOSIS — R0609 Other forms of dyspnea: Secondary | ICD-10-CM

## 2023-08-08 DIAGNOSIS — N184 Chronic kidney disease, stage 4 (severe): Secondary | ICD-10-CM

## 2023-08-08 MED ORDER — FUROSEMIDE 40 MG PO TABS: ORAL_TABLET | ORAL | 1 refills | Status: DC

## 2023-08-10 NOTE — Progress Notes (Signed)
   Subjective:    Patient ID: Amy Kelly, female    DOB: Nov 05, 1928, 89 y.o.   MRN: [id number]  HPI  Patient has a history of CKD 4 She also has chronic history of pedal edema and lower leg edema She is on medications She sees Dr. Carrolyn Clan for her kidneys She is on 60 mg of Lasix  daily Has not changed her diet any recently Denies any major setbacks or problems Denies chest tightness pressure pain or shortness of breath Does relate that the lower legs at times seem to weep and get a little bit red They are not causing her any pain currently Her son is present with her who is a caretaker for her  Review of Systems     Objective:   Physical Exam  General-in no acute distress Eyes-no discharge Lungs-respiratory rate normal, CTA CV-no murmurs,RRR Extremities skin warm dry moderate lower leg edema Neuro grossly normal Behavior normal, alert       Assessment & Plan:   1. Pedal edema (Primary) We will go ahead and increase the dose of her Lasix  40 mg tablet take 2 each morning In addition to this check a metabolic 7 in approximately 7 days Will send a copy of this documentation to Dr. Fernando Hoyer as well as when we get the lab work Also patient is having a severe time taking care of herself at home she is unsteady with her her walker.  Her son is her caretaker but cannot be with her 24/7 I believe she would benefit from having home health consult but also believe she would benefit from having a social worker call and talk with the son to see if there might be additional help that could be granted for her perhaps under chronic care management? - Ambulatory referral to Home Health - Basic metabolic panel with GFR  2. Ataxia Use walker on a regular basis - Ambulatory referral to Home Health - Basic metabolic panel with GFR  3. CKD (chronic kidney disease), stage IV (HCC) Lab work ordered await results of lab work - [employer] - Ambulatory referral to Home Health - Basic  metabolic panel with GFR  4. DOE (dyspnea on exertion) I believe this is related to her deconditioning and also severe swelling in the legs causing heavy legs I do not find evidence of congestive heart failure issues currently - BMP8+EGFR - Ambulatory referral to Home Health - Basic metabolic panel with GFR  Recommend social service consult with [facility] Commend follow-up office visit with Dr. Debrah Fan

## 2023-08-12 NOTE — Progress Notes (Unsigned)
 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  Secondary stroke prevention as managed by PCP: - Plavix  75mg  daily - Statin therapy:  LDL goal less than 70 - Glycemic control:  Hgb A1c goal less than 7 - Normotensive blood pressure 2.  Follow up 1 year     Subjective:  Amy Kelly is an 89 year old right-handed female with HTN, DM II, HLD, CAD, CKD and RLS who follows up for right thalamic infarct.  Accompanied by her son who supplements history.   UPDATE: Current medications:  Plavix  75mg , rosuvastatin  40mg , amlodipine , losartan glimepiride, gabapentin  100mg  BID, ropinirole  2mg  QHS   ***  HISTORY: Patient had a stroke in November 2021, presenting with left sided weakness and numbness and blurred vision.  She presented to the hospital in [place], [place].  CT head showed no acute findings but MRI of brain showed acute/subacute lacunar infarct within the right thalamus.  She was outside the window for tPA.  Carotid ultrasound demonstrated bilateral calcified plaque without hemodynamically significant stenosis.She was on ASA prior to stroke and discharged on DAPT.  Currently still taking ASA with Plavix .  She reports ongoing numbness and discomfort involving the left side of her head, face, arm, hand and leg.  She already has underlying polyneuropathy, gout and arthritis that impedes her balance and is now less steady.  Uses a rolling walker.      PAST MEDICAL HISTORY: Past Medical History:  Diagnosis Date   Allergy    Anxiety and depression    Arthritis    At risk for falls    Cancer [facility])    Cataract    CHF (congestive heart failure) (HCC)    Chronic kidney disease    Coronary artery disease    Diabetes mellitus (HCC) 11/08/2020   Diabetes  mellitus without complication (HCC)    GERD (gastroesophageal reflux disease)    History of skin cancer    Right shin   HOH (hard of hearing)    Hyperlipidemia    Hypertension    Hypothyroidism    Neuromuscular disorder (HCC)    Osteoporosis    Oxygen deficiency    Stroke (HCC)    Ulcer     MEDICATIONS: Current Outpatient Medications on File Prior to Visit  Medication Sig Dispense Refill   amLODipine  (NORVASC ) 5 MG tablet TAKE 1 TABLET BY MOUTH DAILY 90 tablet 3   Ascorbic Acid  (VITAMIN C GUMMIE PO) Take 1,000 mg by mouth in the morning.     B Complex-C (B-COMPLEX WITH VITAMIN C) tablet Take 1 tablet by mouth in the morning.     Blood Glucose Monitoring Suppl DEVI Use up to 4 times daily to check blood sugar. May substitute to any manufacturer covered by patient's insurance. 1 each 0   calcitRIOL (ROCALTROL) 0.25 MCG capsule Take 0.25 mcg by mouth every Monday, Wednesday, and Friday.     Calcium -Phosphorus-Vitamin D  (CALCIUM  GUMMIES PO) Take 2 tablets by mouth in the morning.     clobetasol (TEMOVATE) 0.05 % external solution Apply 1 Application topically in the morning. Applied to the back of the neck     clopidogrel  (PLAVIX ) 75 MG tablet Take 1 tablet (75 mg total) by mouth in the morning. 90 tablet 3   Continuous Glucose Receiver (FREESTYLE LIBRE 3  READER) DEVI Use to check blood sugar continuously. 1 each 0   Continuous Glucose Sensor (FREESTYLE LIBRE 3 SENSOR) MISC Place 1 sensor on the skin every 14 days. Use to check glucose continuously 6 each 3   doxazosin  (CARDURA ) 2 MG tablet Take 1 tablet (2 mg total) by mouth at bedtime. 90 tablet 3   escitalopram  (LEXAPRO ) 20 MG tablet TAKE 1 TABLET BY MOUTH DAILY 90 tablet 1   estradiol  (ESTRACE  VAGINAL) 0.1 MG/GM vaginal cream Place 1 Applicatorful vaginally at bedtime. For 2 weeks, then 3 times a week. 42.5 g 3   famotidine  (PEPCID ) 20 MG tablet Take 1 tablet (20 mg total) by mouth at bedtime. 90 tablet 3   folic acid  (FOLVITE ) 800  MCG tablet Take 800 mcg by mouth in the morning.     furosemide  (LASIX ) 40 MG tablet 2 q am 180 tablet 1   gabapentin  (NEURONTIN ) 100 MG capsule TAKE 1 CAPSULE BY MOUTH IN THE MORNING AND 2 CAPSULES BY MOUTH AT NIGHT 270 capsule 1   glucose blood (ONETOUCH ULTRA) test strip USE UP TO 4 TIMES DAILY TO CHECK BLOOD SUGAR 400 strip 1   insulin  glargine (LANTUS  SOLOSTAR) 100 UNIT/ML Solostar Pen INJECT 30 UNITS UNDER THE SKIN EVERY DAY 30 mL 1   insulin  lispro (HUMALOG  KWIKPEN) 100 UNIT/ML KwikPen INJECT 1 TO 10 UNITS UNDER THE SKIN THREE TIMES DAILY BEFORE MEALS PER SLIDING SCALE 30 mL 3   levothyroxine  (SYNTHROID ) 75 MCG tablet Take 1 tablet (75 mcg total) by mouth daily before breakfast. 90 tablet 1   linagliptin  (TRADJENTA ) 5 MG TABS tablet Take 1 tablet (5 mg total) by mouth daily. 90 tablet 3   loratadine (CLARITIN) 10 MG tablet Take 10 mg by mouth in the morning.     Multiple Vitamin (MULTIVITAMIN WITH MINERALS) TABS tablet Take 1 tablet by mouth in the morning.     Olopatadine HCl (PATADAY OP) Place 1 drop into both eyes in the morning. Once in the mornings in left eye     omeprazole  (PRILOSEC) 40 MG capsule Take 1 capsule (40 mg total) by mouth daily before breakfast. 90 capsule 3   polyethylene glycol (MIRALAX  / GLYCOLAX ) 17 g packet Take 17 g by mouth every other day.     potassium chloride  (KLOR-CON ) 10 MEQ tablet Take 1 tablet (10 mEq total) by mouth daily. 90 tablet 3   rOPINIRole  (REQUIP ) 2 MG tablet TAKE 1 TABLET BY MOUTH AT BEDTIME 90 tablet 1   rosuvastatin  (CRESTOR ) 40 MG tablet Take 1 tablet (40 mg total) by mouth at bedtime. 90 tablet 3   Vibegron  (GEMTESA ) 75 MG TABS Take 1 tablet (75 mg total) by mouth daily. 30 tablet 11   vitamin B-12 (CYANOCOBALAMIN ) 500 MCG tablet Take 1,000 mcg by mouth in the morning.     No current facility-administered medications on file prior to visit.    ALLERGIES: Allergies  Allergen Reactions   Fesoterodine      Severe dry mouth   Penicillins  Diarrhea and Nausea Only   Prednisone Diarrhea and Nausea Only   Solifenacin      Severe dry mouth   Sulfa Antibiotics Diarrhea and Nausea Only    FAMILY HISTORY: Family History  Problem Relation Age of Onset   Heart disease Father    Cancer Brother       Objective:  *** General: No acute distress.  Patient appears well-groomed.   Head:  Normocephalic/atraumatic Eyes:  Fundi examined but not visualized Neck: supple, no  paraspinal tenderness, full range of motion Neurological Exam: Alert and oriented.  Speech fluent and not dysarthric.  Language intact.  Bilateral hearing loss.  Otherwise, CN II-XII intact.  Bulk and tone normal.  Muscle strength 5/5 throughout.  Sensation to light touch intact.  DTR absent throughout.  Finger to nose testing intact.  Cautious wide-based gait.  Uses walker.     Janne Members, DO  CC: Kathleen Papa, DO

## 2023-08-13 ENCOUNTER — Ambulatory Visit (INDEPENDENT_AMBULATORY_CARE_PROVIDER_SITE_OTHER): Payer: Medicare Other | Admitting: Neurology

## 2023-08-13 ENCOUNTER — Encounter: Payer: Self-pay | Admitting: Neurology

## 2023-08-13 VITALS — BP 110/70 | HR 96 | Ht 62.0 in | Wt 210.0 lb

## 2023-08-13 DIAGNOSIS — G890 Central pain syndrome: Secondary | ICD-10-CM | POA: Diagnosis not present

## 2023-08-13 DIAGNOSIS — R0609 Other forms of dyspnea: Secondary | ICD-10-CM | POA: Diagnosis not present

## 2023-08-13 DIAGNOSIS — G8929 Other chronic pain: Secondary | ICD-10-CM

## 2023-08-13 DIAGNOSIS — N184 Chronic kidney disease, stage 4 (severe): Secondary | ICD-10-CM | POA: Diagnosis not present

## 2023-08-13 NOTE — Patient Instructions (Signed)
 I would continue working on getting the fluid off of your legs and see if some of the pain improves.  I wouldn't increase the gabapentin  until ok with Dr. Debrah Fan

## 2023-08-14 ENCOUNTER — Ambulatory Visit: Payer: Self-pay | Admitting: Family Medicine

## 2023-08-14 LAB — BMP8+EGFR
BUN/Creatinine Ratio: 23 (ref 12–28)
BUN: 51 mg/dL — ABNORMAL HIGH (ref 10–36)
CO2: 24 mmol/L (ref 20–29)
Calcium: 9.1 mg/dL (ref 8.7–10.3)
Chloride: 99 mmol/L (ref 96–106)
Creatinine, Ser: 2.26 mg/dL — ABNORMAL HIGH (ref 0.57–1.00)
Glucose: 191 mg/dL — ABNORMAL HIGH (ref 70–99)
Potassium: 4.9 mmol/L (ref 3.5–5.2)
Sodium: 140 mmol/L (ref 134–144)
eGFR: 20 mL/min/{1.73_m2} — ABNORMAL LOW (ref >59)

## 2023-08-18 ENCOUNTER — Other Ambulatory Visit: Payer: Self-pay | Admitting: Family Medicine

## 2023-08-19 ENCOUNTER — Other Ambulatory Visit: Payer: Self-pay

## 2023-08-19 MED ORDER — ROPINIROLE HCL 2 MG PO TABS
2.0000 mg | ORAL_TABLET | Freq: Every day | ORAL | 1 refills | Status: DC
Start: 2023-08-19 — End: 2023-10-29

## 2023-08-19 NOTE — Addendum Note (Signed)
 Addended by: Rooney Cole on: 08/19/2023 09:42 AM   Modules accepted: Orders

## 2023-08-20 ENCOUNTER — Telehealth: Payer: Self-pay

## 2023-08-20 DIAGNOSIS — I130 Hypertensive heart and chronic kidney disease with heart failure and stage 1 through stage 4 chronic kidney disease, or unspecified chronic kidney disease: Secondary | ICD-10-CM | POA: Diagnosis not present

## 2023-08-20 DIAGNOSIS — E11319 Type 2 diabetes mellitus with unspecified diabetic retinopathy without macular edema: Secondary | ICD-10-CM | POA: Diagnosis not present

## 2023-08-20 DIAGNOSIS — M810 Age-related osteoporosis without current pathological fracture: Secondary | ICD-10-CM | POA: Diagnosis not present

## 2023-08-20 DIAGNOSIS — F419 Anxiety disorder, unspecified: Secondary | ICD-10-CM | POA: Diagnosis not present

## 2023-08-20 DIAGNOSIS — Z7984 Long term (current) use of oral hypoglycemic drugs: Secondary | ICD-10-CM | POA: Diagnosis not present

## 2023-08-20 DIAGNOSIS — E1140 Type 2 diabetes mellitus with diabetic neuropathy, unspecified: Secondary | ICD-10-CM | POA: Diagnosis not present

## 2023-08-20 DIAGNOSIS — Z7902 Long term (current) use of antithrombotics/antiplatelets: Secondary | ICD-10-CM | POA: Diagnosis not present

## 2023-08-20 DIAGNOSIS — E1122 Type 2 diabetes mellitus with diabetic chronic kidney disease: Secondary | ICD-10-CM | POA: Diagnosis not present

## 2023-08-20 DIAGNOSIS — Z794 Long term (current) use of insulin: Secondary | ICD-10-CM | POA: Diagnosis not present

## 2023-08-20 DIAGNOSIS — R270 Ataxia, unspecified: Secondary | ICD-10-CM | POA: Diagnosis not present

## 2023-08-20 DIAGNOSIS — E039 Hypothyroidism, unspecified: Secondary | ICD-10-CM | POA: Diagnosis not present

## 2023-08-20 DIAGNOSIS — E1165 Type 2 diabetes mellitus with hyperglycemia: Secondary | ICD-10-CM | POA: Diagnosis not present

## 2023-08-20 DIAGNOSIS — Z556 Problems related to health literacy: Secondary | ICD-10-CM | POA: Diagnosis not present

## 2023-08-20 DIAGNOSIS — N184 Chronic kidney disease, stage 4 (severe): Secondary | ICD-10-CM | POA: Diagnosis not present

## 2023-08-20 DIAGNOSIS — F32A Depression, unspecified: Secondary | ICD-10-CM | POA: Diagnosis not present

## 2023-08-20 DIAGNOSIS — I5032 Chronic diastolic (congestive) heart failure: Secondary | ICD-10-CM | POA: Diagnosis not present

## 2023-08-20 NOTE — Telephone Encounter (Signed)
 Please advise   Copied from CRM [id number]. Topic: General - Other >> Aug 20, 2023  4:36 PM Santiya F wrote: Reason for CRM: Bartholomew Light with [facility] is calling in because she says patient has cellulitis on her legs and she wants to know if the provider will send in some antibiotics. Bartholomew Light can be reached at [phone]

## 2023-08-21 ENCOUNTER — Ambulatory Visit: Admitting: Family Medicine

## 2023-08-21 ENCOUNTER — Encounter: Payer: Self-pay | Admitting: Family Medicine

## 2023-08-21 VITALS — BP 142/84 | HR 61 | Temp 97.9°F | Ht 62.0 in | Wt 207.0 lb

## 2023-08-21 DIAGNOSIS — I10 Essential (primary) hypertension: Secondary | ICD-10-CM

## 2023-08-21 DIAGNOSIS — Z853 Personal history of malignant neoplasm of breast: Secondary | ICD-10-CM | POA: Diagnosis not present

## 2023-08-21 DIAGNOSIS — R600 Localized edema: Secondary | ICD-10-CM

## 2023-08-21 MED ORDER — INSULIN PEN NEEDLE 31G X 8 MM MISC: 4 refills | Status: DC

## 2023-08-21 NOTE — Patient Instructions (Addendum)
 Stop amlodipine .  I will send in Rx for mastectomy bras.  Take care  Dr. Debrah Fan

## 2023-08-24 DIAGNOSIS — Z853 Personal history of malignant neoplasm of breast: Secondary | ICD-10-CM | POA: Insufficient documentation

## 2023-08-24 DIAGNOSIS — R600 Localized edema: Secondary | ICD-10-CM | POA: Insufficient documentation

## 2023-08-24 NOTE — Assessment & Plan Note (Signed)
 Will send in Rx for Mastectomy bras and prosthesis.

## 2023-08-24 NOTE — Progress Notes (Signed)
 Subjective:  Patient ID: Amy Kelly, female    DOB: 1928-09-08  Age: 89 y.o. MRN: [id number]  CC:   Chief Complaint  Patient presents with   Cellulitis    Both legs     HPI:  89 year old female presents for follow up.    Home health RN called with concerns for lower extremity cellulitis.  Patient has ongoing edema of the lower extremities. Associated redness. Recently seen by Dr. Fairy Homer.  Lasix  was increased. No fever.   Patient needs Rx for mastectomy Aleene Hurry and prosthesis faxed to [employer].  History of breast cancer (R) and mastectomy.   Also needs refill of insulin  needles.   Patient Active Problem List   Diagnosis Date Noted   Lower extremity edema 08/24/2023   History of breast cancer 08/24/2023   Sensorineural hearing loss, bilateral 02/12/2023   Neuropathy 12/18/2022   Physical deconditioning 12/18/2022   Uncontrolled type 2 diabetes mellitus with hyperglycemia (HCC) 07/07/2022   RLS (restless legs syndrome) 07/07/2022   Chronic diastolic heart failure (HCC) 07/03/2022   Constipation 02/24/2022   Urge incontinence 09/10/2021   CKD (chronic kidney disease), stage IV (HCC) 11/09/2020   HTN (hypertension) 11/08/2020   Hypothyroidism 11/08/2020   Anxiety 11/08/2020   Stroke (HCC) 11/08/2020    Social Hx   Social History   Socioeconomic History   Marital status: Widowed    Spouse name: Not on file   Number of children: Not on file   Years of education: Not on file   Highest education level: 5th grade  Occupational History   Not on file  Tobacco Use   Smoking status: Never    Passive exposure: Never   Smokeless tobacco: Never  Vaping Use   Vaping status: Never Used  Substance and Sexual Activity   Alcohol use: Never   Drug use: Never   Sexual activity: Not Currently    Birth control/protection: None  Other Topics Concern   Not on file  Social History Narrative   Right handed   Social Drivers of Health   Financial Resource Strain: Medium Risk  (03/19/2023)   Overall Financial Resource Strain (CARDIA)    Difficulty of Paying Living Expenses: Somewhat hard  Food Insecurity: Food Insecurity Present (03/19/2023)   Hunger Vital Sign    Worried About Running Out of Food in the Last Year: Sometimes true    Ran Out of Food in the Last Year: Sometimes true  Transportation Needs: No Transportation Needs (03/19/2023)   PRAPARE - Administrator, Civil Service (Medical): No    Lack of Transportation (Non-Medical): No  Physical Activity: Inactive (03/19/2023)   Exercise Vital Sign    Days of Exercise per Week: 0 days    Minutes of Exercise per Session: 0 min  Stress: No Stress Concern Present (03/19/2023)   [employer] of Occupational Health - Occupational Stress Questionnaire    Feeling of Stress : Only a little  Social Connections: Socially Isolated (03/19/2023)   Social Connection and Isolation Panel [NHANES]    Frequency of Communication with Friends and Family: Twice a week    Frequency of Social Gatherings with Friends and Family: Never    Attends Religious Services: Never    Database administrator or Organizations: No    Attends Banker Meetings: Never    Marital Status: Widowed    Review of Systems Per HPI  Objective:  BP (!) 142/84   Pulse 61   Temp 97.9 F (  36.6 C)   Ht 5\' 2"  (1.575 m)   Wt 207 lb (93.9 kg)   SpO2 95%   BMI 37.86 kg/m      08/21/2023    2:28 PM 08/13/2023    4:08 PM 08/08/2023    3:28 PM  BP/Weight  Systolic BP 142 110 138  Diastolic BP 84 70 58  Wt. (Lbs) 207 210 210  BMI 37.86 kg/m2 38.41 kg/m2 37.2 kg/m2    Physical Exam Vitals and nursing note reviewed.  Constitutional:      General: She is not in acute distress.    Appearance: She is obese.  HENT:     Head: Normocephalic and atraumatic.  Eyes:     General:        Right eye: No discharge.        Left eye: No discharge.     Conjunctiva/sclera: Conjunctivae normal.  Pulmonary:     Effort: Pulmonary effort is  normal.  Neurological:     Mental Status: She is alert.  Psychiatric:        Mood and Affect: Mood normal.        Behavior: Behavior normal.     Lab Results  Component Value Date   WBC 9.4 03/20/2023   HGB 11.8 03/20/2023   HCT 36.6 03/20/2023   PLT 184 03/20/2023   GLUCOSE 191 (H) 08/13/2023   CHOL 120 03/20/2023   TRIG 156 (H) 03/20/2023   HDL 42 03/20/2023   LDLCALC 51 03/20/2023   ALT 18 03/20/2023   AST 18 03/20/2023   NA 140 08/13/2023   K 4.9 08/13/2023   CL 99 08/13/2023   CREATININE 2.26 (H) 08/13/2023   BUN 51 (H) 08/13/2023   CO2 24 08/13/2023   TSH 5.410 (H) 03/20/2023   HGBA1C 7.2 (A) 06/24/2023     Assessment & Plan:  Lower extremity edema Assessment & Plan: Multifactorial in nature. Stopping Norvasc . Continue Lasix .   Primary hypertension Assessment & Plan: Side effects from Norvasc . Discontinuing.    History of breast cancer Assessment & Plan: Will send in Rx for Mastectomy bras and prosthesis.    Other orders -     Insulin  Pen Needle; Use 4 times daily for administration of insulin .  Dispense: 100 each; Refill: 4    Follow-up:  Has follow up scheduled.  Kathleen Papa DO [facility]

## 2023-08-24 NOTE — Assessment & Plan Note (Signed)
 Multifactorial in nature. Stopping Norvasc . Continue Lasix .

## 2023-08-24 NOTE — Assessment & Plan Note (Signed)
 Side effects from Norvasc . Discontinuing.

## 2023-08-27 DIAGNOSIS — F419 Anxiety disorder, unspecified: Secondary | ICD-10-CM | POA: Diagnosis not present

## 2023-08-27 DIAGNOSIS — I5032 Chronic diastolic (congestive) heart failure: Secondary | ICD-10-CM | POA: Diagnosis not present

## 2023-08-27 DIAGNOSIS — E11319 Type 2 diabetes mellitus with unspecified diabetic retinopathy without macular edema: Secondary | ICD-10-CM | POA: Diagnosis not present

## 2023-08-27 DIAGNOSIS — N184 Chronic kidney disease, stage 4 (severe): Secondary | ICD-10-CM | POA: Diagnosis not present

## 2023-08-27 DIAGNOSIS — Z794 Long term (current) use of insulin: Secondary | ICD-10-CM | POA: Diagnosis not present

## 2023-08-27 DIAGNOSIS — R270 Ataxia, unspecified: Secondary | ICD-10-CM | POA: Diagnosis not present

## 2023-08-27 DIAGNOSIS — M810 Age-related osteoporosis without current pathological fracture: Secondary | ICD-10-CM | POA: Diagnosis not present

## 2023-08-27 DIAGNOSIS — I130 Hypertensive heart and chronic kidney disease with heart failure and stage 1 through stage 4 chronic kidney disease, or unspecified chronic kidney disease: Secondary | ICD-10-CM | POA: Diagnosis not present

## 2023-08-27 DIAGNOSIS — E1165 Type 2 diabetes mellitus with hyperglycemia: Secondary | ICD-10-CM | POA: Diagnosis not present

## 2023-08-27 DIAGNOSIS — F32A Depression, unspecified: Secondary | ICD-10-CM | POA: Diagnosis not present

## 2023-08-27 DIAGNOSIS — Z7984 Long term (current) use of oral hypoglycemic drugs: Secondary | ICD-10-CM | POA: Diagnosis not present

## 2023-08-27 DIAGNOSIS — Z7902 Long term (current) use of antithrombotics/antiplatelets: Secondary | ICD-10-CM | POA: Diagnosis not present

## 2023-08-27 DIAGNOSIS — E039 Hypothyroidism, unspecified: Secondary | ICD-10-CM | POA: Diagnosis not present

## 2023-08-27 DIAGNOSIS — E1122 Type 2 diabetes mellitus with diabetic chronic kidney disease: Secondary | ICD-10-CM | POA: Diagnosis not present

## 2023-08-27 DIAGNOSIS — E1140 Type 2 diabetes mellitus with diabetic neuropathy, unspecified: Secondary | ICD-10-CM | POA: Diagnosis not present

## 2023-08-27 DIAGNOSIS — Z556 Problems related to health literacy: Secondary | ICD-10-CM | POA: Diagnosis not present

## 2023-08-28 ENCOUNTER — Other Ambulatory Visit: Payer: Self-pay | Admitting: Family Medicine

## 2023-09-02 DIAGNOSIS — I130 Hypertensive heart and chronic kidney disease with heart failure and stage 1 through stage 4 chronic kidney disease, or unspecified chronic kidney disease: Secondary | ICD-10-CM | POA: Diagnosis not present

## 2023-09-02 DIAGNOSIS — E039 Hypothyroidism, unspecified: Secondary | ICD-10-CM | POA: Diagnosis not present

## 2023-09-02 DIAGNOSIS — Z7902 Long term (current) use of antithrombotics/antiplatelets: Secondary | ICD-10-CM | POA: Diagnosis not present

## 2023-09-02 DIAGNOSIS — M810 Age-related osteoporosis without current pathological fracture: Secondary | ICD-10-CM | POA: Diagnosis not present

## 2023-09-02 DIAGNOSIS — Z556 Problems related to health literacy: Secondary | ICD-10-CM | POA: Diagnosis not present

## 2023-09-02 DIAGNOSIS — E1140 Type 2 diabetes mellitus with diabetic neuropathy, unspecified: Secondary | ICD-10-CM | POA: Diagnosis not present

## 2023-09-02 DIAGNOSIS — E1122 Type 2 diabetes mellitus with diabetic chronic kidney disease: Secondary | ICD-10-CM | POA: Diagnosis not present

## 2023-09-02 DIAGNOSIS — I5032 Chronic diastolic (congestive) heart failure: Secondary | ICD-10-CM | POA: Diagnosis not present

## 2023-09-02 DIAGNOSIS — E1165 Type 2 diabetes mellitus with hyperglycemia: Secondary | ICD-10-CM | POA: Diagnosis not present

## 2023-09-02 DIAGNOSIS — N184 Chronic kidney disease, stage 4 (severe): Secondary | ICD-10-CM | POA: Diagnosis not present

## 2023-09-02 DIAGNOSIS — Z794 Long term (current) use of insulin: Secondary | ICD-10-CM | POA: Diagnosis not present

## 2023-09-02 DIAGNOSIS — E11319 Type 2 diabetes mellitus with unspecified diabetic retinopathy without macular edema: Secondary | ICD-10-CM | POA: Diagnosis not present

## 2023-09-02 DIAGNOSIS — F32A Depression, unspecified: Secondary | ICD-10-CM | POA: Diagnosis not present

## 2023-09-02 DIAGNOSIS — Z7984 Long term (current) use of oral hypoglycemic drugs: Secondary | ICD-10-CM | POA: Diagnosis not present

## 2023-09-02 DIAGNOSIS — F419 Anxiety disorder, unspecified: Secondary | ICD-10-CM | POA: Diagnosis not present

## 2023-09-02 DIAGNOSIS — R270 Ataxia, unspecified: Secondary | ICD-10-CM | POA: Diagnosis not present

## 2023-09-03 DIAGNOSIS — Z7902 Long term (current) use of antithrombotics/antiplatelets: Secondary | ICD-10-CM | POA: Diagnosis not present

## 2023-09-03 DIAGNOSIS — Z7984 Long term (current) use of oral hypoglycemic drugs: Secondary | ICD-10-CM | POA: Diagnosis not present

## 2023-09-03 DIAGNOSIS — F419 Anxiety disorder, unspecified: Secondary | ICD-10-CM | POA: Diagnosis not present

## 2023-09-03 DIAGNOSIS — I5032 Chronic diastolic (congestive) heart failure: Secondary | ICD-10-CM | POA: Diagnosis not present

## 2023-09-03 DIAGNOSIS — Z794 Long term (current) use of insulin: Secondary | ICD-10-CM | POA: Diagnosis not present

## 2023-09-03 DIAGNOSIS — Z556 Problems related to health literacy: Secondary | ICD-10-CM | POA: Diagnosis not present

## 2023-09-03 DIAGNOSIS — E1165 Type 2 diabetes mellitus with hyperglycemia: Secondary | ICD-10-CM | POA: Diagnosis not present

## 2023-09-03 DIAGNOSIS — F32A Depression, unspecified: Secondary | ICD-10-CM | POA: Diagnosis not present

## 2023-09-03 DIAGNOSIS — E1122 Type 2 diabetes mellitus with diabetic chronic kidney disease: Secondary | ICD-10-CM | POA: Diagnosis not present

## 2023-09-03 DIAGNOSIS — M810 Age-related osteoporosis without current pathological fracture: Secondary | ICD-10-CM | POA: Diagnosis not present

## 2023-09-03 DIAGNOSIS — I130 Hypertensive heart and chronic kidney disease with heart failure and stage 1 through stage 4 chronic kidney disease, or unspecified chronic kidney disease: Secondary | ICD-10-CM | POA: Diagnosis not present

## 2023-09-03 DIAGNOSIS — E11319 Type 2 diabetes mellitus with unspecified diabetic retinopathy without macular edema: Secondary | ICD-10-CM | POA: Diagnosis not present

## 2023-09-03 DIAGNOSIS — E1140 Type 2 diabetes mellitus with diabetic neuropathy, unspecified: Secondary | ICD-10-CM | POA: Diagnosis not present

## 2023-09-03 DIAGNOSIS — R270 Ataxia, unspecified: Secondary | ICD-10-CM | POA: Diagnosis not present

## 2023-09-03 DIAGNOSIS — E039 Hypothyroidism, unspecified: Secondary | ICD-10-CM | POA: Diagnosis not present

## 2023-09-03 DIAGNOSIS — N184 Chronic kidney disease, stage 4 (severe): Secondary | ICD-10-CM | POA: Diagnosis not present

## 2023-09-04 ENCOUNTER — Other Ambulatory Visit: Payer: Self-pay | Admitting: Family Medicine

## 2023-09-05 ENCOUNTER — Encounter: Payer: Self-pay | Admitting: Physician Assistant

## 2023-09-05 ENCOUNTER — Ambulatory Visit (INDEPENDENT_AMBULATORY_CARE_PROVIDER_SITE_OTHER)

## 2023-09-05 ENCOUNTER — Ambulatory Visit: Payer: Medicare Other

## 2023-09-05 ENCOUNTER — Ambulatory Visit: Admitting: Physician Assistant

## 2023-09-05 VITALS — BP 142/82 | HR 66 | Temp 98.1°F | Ht 62.0 in | Wt 206.0 lb

## 2023-09-05 VITALS — BP 142/82 | Ht 62.0 in | Wt 207.0 lb

## 2023-09-05 DIAGNOSIS — Z0000 Encounter for general adult medical examination without abnormal findings: Secondary | ICD-10-CM | POA: Diagnosis not present

## 2023-09-05 DIAGNOSIS — R600 Localized edema: Secondary | ICD-10-CM | POA: Diagnosis not present

## 2023-09-05 DIAGNOSIS — Z780 Asymptomatic menopausal state: Secondary | ICD-10-CM

## 2023-09-05 NOTE — Patient Instructions (Addendum)
 Amy Kelly , Thank you for taking time out of your busy schedule to complete your Annual Wellness Visit with me. I enjoyed our conversation and look forward to speaking with you again next year. I, as well as your care team,  appreciate your ongoing commitment to your health goals. Please review the following plan we discussed and let me know if I can assist you in the future. Your Game plan/ To Do List    You have an order for:  []   2D Mammogram  []   3D Mammogram  [x]   Bone Density     Please call for appointment:   [facility] [address] [place], [place] [address] [phone]   Make sure to wear two-piece clothing.  No lotions, powders, or deodorants the day of the appointment. Make sure to bring picture ID and insurance card.  Bring list of medications you are currently taking including any supplements.    Follow up Visits: Next Medicare AWV with our clinical staff: In 1 year    Have you seen your provider in the last 6 months (3 months if uncontrolled diabetes)? Yes Next Office Visit with your provider: 09/17/23 @ 2:30  Clinician Recommendations:  Aim for 30 minutes of exercise or brisk walking, 6-8 glasses of water, and 5 servings of fruits and vegetables each day.       This is a list of the screening recommended for you and due dates:  Health Maintenance  Topic Date Due   Eye exam for diabetics  Never done   DTaP/Tdap/Td vaccine (1 - Tdap) Never done   COVID-19 Vaccine (6 - 2024-25 season) 03/13/2023   Zoster (Shingles) Vaccine (1 of 2) 10/09/2023*   Flu Shot  10/17/2023   Complete foot exam   12/16/2023   Hemoglobin A1C  12/24/2023   Medicare Annual Wellness Visit  09/04/2024   Pneumococcal Vaccine for age over 84  Completed   DEXA scan (bone density measurement)  Completed   HPV Vaccine  Aged Out   Meningitis B Vaccine  Aged Out  *Topic was postponed. The date shown is not the original due date.    Advanced directives:  (ACP Link)Information on Advanced Care Planning can be found at [employer] [facility] Advance Health Care Directives Advance Health Care Directives. [email or web address]   Advance Care Planning is important because it:  [x]  Makes sure you receive the medical care that is consistent with your values, goals, and preferences  [x]  It provides guidance to your family and loved ones and reduces their decisional burden about whether or not they are making the right decisions based on your wishes.  Follow the link provided in your after visit summary or read over the paperwork we have mailed to you to help you started getting your Advance Directives in place. If you need assistance in completing these, please reach out to us  so that we can help you!  See attachments for Preventive Care and Fall Prevention Tips.

## 2023-09-05 NOTE — Progress Notes (Signed)
 Subjective:   Amy Kelly is a 89 y.o. who presents for a Medicare Wellness preventive visit.  As a reminder, Annual Wellness Visits don't include a physical exam, and some assessments may be limited, especially if this visit is performed virtually. We may recommend an in-person follow-up visit with your provider if needed.  Visit Complete: In person  Persons Participating in Visit: Patient assisted by son Amy Kelly .  AWV Questionnaire: Yes: Patient Medicare AWV questionnaire was completed by the patient on 09/04/23; I have confirmed that all information answered by patient is correct and no changes since this date.  Cardiac Risk Factors include: advanced age (>42men, >5 women);diabetes mellitus;dyslipidemia;hypertension;sedentary lifestyle     Objective:    Today's Vitals   09/05/23 1510  BP: (!) 142/82  Weight: 207 lb (93.9 kg)  Height: 5' 2 (1.575 m)   Body mass index is 37.86 kg/m.     09/05/2023    3:43 PM 08/13/2023    3:29 PM 09/05/2022    4:11 PM 08/30/2022    2:59 PM 08/07/2022    2:20 PM 04/09/2022   11:33 AM 04/04/2022   11:16 AM  Advanced Directives  Does Patient Have a Medical Advance Directive? No No No No Yes No No  Type of Agricultural consultant;Living will    Would patient like information on creating a medical advance directive? Yes (MAU/Ambulatory/Procedural Areas - Information given)   No - Patient declined  No - Patient declined No - Patient declined    Current Medications (verified) Outpatient Encounter Medications as of 09/05/2023  Medication Sig   Ascorbic Acid  (VITAMIN C GUMMIE PO) Take 1,000 mg by mouth in the morning.   B Complex-C (B-COMPLEX WITH VITAMIN C) tablet Take 1 tablet by mouth in the morning.   Blood Glucose Monitoring Suppl DEVI Use up to 4 times daily to check blood sugar. May substitute to any manufacturer covered by patient's insurance.   calcitRIOL (ROCALTROL) 0.25 MCG capsule Take 0.25 mcg by mouth every  Monday, Wednesday, and Friday.   Calcium -Phosphorus-Vitamin D  (CALCIUM  GUMMIES PO) Take 2 tablets by mouth in the morning.   clobetasol (TEMOVATE) 0.05 % external solution Apply 1 Application topically in the morning. Applied to the back of the neck   clopidogrel  (PLAVIX ) 75 MG tablet Take 1 tablet (75 mg total) by mouth in the morning.   Continuous Glucose Receiver (FREESTYLE LIBRE 3 READER) DEVI Use to check blood sugar continuously.   Continuous Glucose Sensor (FREESTYLE LIBRE 3 SENSOR) MISC Place 1 sensor on the skin every 14 days. Use to check glucose continuously   doxazosin  (CARDURA ) 2 MG tablet Take 1 tablet (2 mg total) by mouth at bedtime.   escitalopram  (LEXAPRO ) 20 MG tablet TAKE 1 TABLET BY MOUTH DAILY   estradiol  (ESTRACE  VAGINAL) 0.1 MG/GM vaginal cream Place 1 Applicatorful vaginally at bedtime. For 2 weeks, then 3 times a week.   famotidine  (PEPCID ) 20 MG tablet Take 1 tablet (20 mg total) by mouth at bedtime.   folic acid  (FOLVITE ) 800 MCG tablet Take 800 mcg by mouth in the morning.   furosemide  (LASIX ) 40 MG tablet 2 q am (Patient taking differently: Take 80 mg by mouth daily. 2 q am)   gabapentin  (NEURONTIN ) 100 MG capsule TAKE 1 CAPSULE BY MOUTH IN THE MORNING AND 2 CAPSULES BY MOUTH AT NIGHT   insulin  lispro (HUMALOG  KWIKPEN) 100 UNIT/ML KwikPen INJECT 1 TO 10 UNITS UNDER THE SKIN THREE TIMES DAILY  BEFORE MEALS PER SLIDING SCALE   Insulin  Pen Needle 31G X 8 MM MISC Use 4 times daily for administration of insulin .   LANTUS  SOLOSTAR 100 UNIT/ML Solostar Pen INJECT 30 UNITS UNDER THE SKIN EVERY DAY   levothyroxine  (SYNTHROID ) 75 MCG tablet TAKE 1 TABLET BY MOUTH DAILY BEFORE BREAKFAST   linagliptin  (TRADJENTA ) 5 MG TABS tablet Take 1 tablet (5 mg total) by mouth daily.   loratadine (CLARITIN) 10 MG tablet Take 10 mg by mouth in the morning.   Multiple Vitamin (MULTIVITAMIN WITH MINERALS) TABS tablet Take 1 tablet by mouth in the morning.   Olopatadine HCl (PATADAY OP) Place 1  drop into both eyes in the morning. Once in the mornings in left eye   omeprazole  (PRILOSEC) 40 MG capsule Take 1 capsule (40 mg total) by mouth daily before breakfast.   ONETOUCH ULTRA test strip USE UP TO 4 TIMES DAILY TO CHECK BLOOD SUGAR   polyethylene glycol (MIRALAX  / GLYCOLAX ) 17 g packet Take 17 g by mouth every other day.   potassium chloride  (KLOR-CON ) 10 MEQ tablet Take 1 tablet (10 mEq total) by mouth daily.   rOPINIRole  (REQUIP ) 2 MG tablet Take 1 tablet (2 mg total) by mouth at bedtime.   rosuvastatin  (CRESTOR ) 40 MG tablet Take 1 tablet (40 mg total) by mouth at bedtime.   Vibegron  (GEMTESA ) 75 MG TABS Take 1 tablet (75 mg total) by mouth daily.   vitamin B-12 (CYANOCOBALAMIN ) 500 MCG tablet Take 1,000 mcg by mouth in the morning.   No facility-administered encounter medications on file as of 09/05/2023.    Allergies (verified) Fesoterodine , Penicillins, Prednisone, Solifenacin , and Sulfa antibiotics   History: Past Medical History:  Diagnosis Date   Allergy    Anxiety and depression    Arthritis    At risk for falls    Cancer [facility])    Cataract    CHF (congestive heart failure) (HCC)    Chronic kidney disease    Coronary artery disease    Diabetes mellitus (HCC) 11/08/2020   Diabetes mellitus without complication (HCC)    GERD (gastroesophageal reflux disease)    Heart murmur    History of skin cancer    Right shin   HOH (hard of hearing)    Hyperlipidemia    Hypertension    Hypothyroidism    Neuromuscular disorder (HCC)    Osteoporosis    Oxygen deficiency    Stroke (HCC)    Ulcer    Past Surgical History:  Procedure Laterality Date   ABDOMINAL HYSTERECTOMY     APPENDECTOMY     BREAST SURGERY     CARDIAC CATHETERIZATION     [phone]   CARDIAC CATHETERIZATION     x 2 stents   CARPAL TUNNEL RELEASE Left    CERVICAL SPINE SURGERY     CHOLECYSTECTOMY  1967   DIAGNOSTIC MAMMOGRAM     dnc     ESOPHAGEAL DILATION N/A 04/09/2022   Procedure:  ESOPHAGEAL DILATION;  Surgeon: Urban Garden, MD;  [facility];  Service: Gastroenterology;  Laterality: N/A;   ESOPHAGOGASTRODUODENOSCOPY (EGD) WITH PROPOFOL  N/A 04/09/2022   Procedure: ESOPHAGOGASTRODUODENOSCOPY (EGD) WITH PROPOFOL ;  Surgeon: Urban Garden, MD;  Location: [facility];  Service: Gastroenterology;  Laterality: N/A;  2:30pm, asa 3   EYE SURGERY     HERNIA REPAIR     HYSTERECTOMY ABDOMINAL WITH SALPINGO-OOPHORECTOMY  1960   MASTECTOMY Right 2012   SKIN CANCER EXCISION     SPINE SURGERY  THORACIC SPINE SURGERY     Family History  Problem Relation Age of Onset   Heart disease Father    Cancer Brother    Social History   Socioeconomic History   Marital status: Widowed    Spouse name: Not on file   Number of children: Not on file   Years of education: Not on file   Highest education level: 7th grade  Occupational History   Not on file  Tobacco Use   Smoking status: Never    Passive exposure: Never   Smokeless tobacco: Never  Vaping Use   Vaping status: Never Used  Substance and Sexual Activity   Alcohol use: Never   Drug use: Never   Sexual activity: Not Currently    Birth control/protection: None  Other Topics Concern   Not on file  Social History Narrative   Right handed   Social Drivers of Health   Financial Resource Strain: Low Risk  (09/04/2023)   Overall Financial Resource Strain (CARDIA)    Difficulty of Paying Living Expenses: Not very hard  Food Insecurity: No Food Insecurity (09/04/2023)   Hunger Vital Sign    Worried About Running Out of Food in the Last Year: Never true    Ran Out of Food in the Last Year: Never true  Transportation Needs: No Transportation Needs (09/04/2023)   PRAPARE - Administrator, Civil Service (Medical): No    Lack of Transportation (Non-Medical): No  Physical Activity: Inactive (09/04/2023)   Exercise Vital Sign    Days of Exercise per Week: 0 days    Minutes of  Exercise per Session: 0 min  Stress: Stress Concern Present (09/04/2023)   [employer] of Occupational Health - Occupational Stress Questionnaire    Feeling of Stress: To some extent  Social Connections: Socially Isolated (09/04/2023)   Social Connection and Isolation Panel    Frequency of Communication with Friends and Family: Three times a week    Frequency of Social Gatherings with Friends and Family: Never    Attends Religious Services: Never    Database administrator or Organizations: No    Attends Banker Meetings: Never    Marital Status: Widowed    Tobacco Counseling Counseling given: Not Answered    Clinical Intake:  Pre-visit preparation completed: Yes  Pain : No/denies pain     Diabetes: No  Lab Results  Component Value Date   HGBA1C 7.2 (A) 06/24/2023   HGBA1C 7.7 (H) 03/20/2023   HGBA1C 9.1 (H) 09/16/2022     How often do you need to have someone help you when you read instructions, pamphlets, or other written materials from your doctor or pharmacy?: 1 - Never  Interpreter Needed?: No  Information entered by :: Amy Cypress LPN   Activities of Daily Living     09/04/2023   10:48 AM  In your present state of health, do you have any difficulty performing the following activities:  Hearing? 1  Vision? 0  Difficulty concentrating or making decisions? 1  Walking or climbing stairs? 1  Dressing or bathing? 1  Doing errands, shopping? 1  Preparing Food and eating ? Y  Using the Toilet? N  In the past six months, have you accidently leaked urine? Y  Do you have problems with loss of bowel control? Y  Managing your Medications? Y  Managing your Finances? Y  Housekeeping or managing your Housekeeping? Y    Patient Care Team: Cook, Jayce G, DO as  PCP - General (Family Medicine) Patty, A. Hansel Ley, MD (Ophthalmology) Merriam Abbey, DO as Consulting Physician (Neurology) Thapa, [place], MD as Consulting Physician  (Endocrinology) Claretta Croft Arden Beck, MD as Consulting Physician (Urology)  I have updated your Care Teams any recent Medical Services you may have received from other providers in the past year.     Assessment:   This is a routine wellness examination for Amy Kelly.  Hearing/Vision screen Hearing Screening - Comments:: Hard of hearing  Vision Screening - Comments:: Wears rx glasses - up to date with routine eye exams with [facility]    Goals Addressed   None    Depression Screen     09/05/2023    3:08 PM 08/21/2023    2:34 PM 08/08/2023    3:42 PM 07/04/2023    1:26 PM 03/20/2023    2:09 PM 12/17/2022    1:23 PM 09/16/2022   11:52 AM  PHQ 2/9 Scores  PHQ - 2 Score 4 4 5 5  0 4 2  PHQ- 9 Score 16 17 14 20 5 16 14     Fall Risk     09/04/2023   10:48 AM 08/21/2023    2:34 PM 08/13/2023    3:29 PM 08/08/2023    3:42 PM 07/04/2023    1:26 PM  Fall Risk   Falls in the past year? 1 1 1  0 1  Number falls in past yr: 1 1 1  0 1  Injury with Fall? 1 1 0 0   Risk for fall due to : History of fall(s);Impaired balance/gait;Impaired mobility   Impaired balance/gait;Impaired mobility   Follow up Falls prevention discussed;Education provided;Falls evaluation completed  Falls evaluation completed Falls evaluation completed     MEDICARE RISK AT HOME:  Medicare Risk at Home Any stairs in or around the home?: (Patient-Rptd) No If so, are there any without handrails?: (Patient-Rptd) No Home free of loose throw rugs in walkways, pet beds, electrical cords, etc?: (Patient-Rptd) Yes Adequate lighting in your home to reduce risk of falls?: (Patient-Rptd) Yes Life alert?: (Patient-Rptd) No Use of a cane, walker or w/c?: (Patient-Rptd) Yes Grab bars in the bathroom?: (Patient-Rptd) Yes Shower chair or bench in shower?: (Patient-Rptd) Yes Elevated toilet seat or a handicapped toilet?: (Patient-Rptd) Yes  TIMED UP AND GO:  Was the test performed?  Yes  Length of time to ambulate 10 feet: 12 sec Gait  slow and steady with assistive device  Cognitive Function: Declined/Normal: No cognitive concerns noted by patient or family. Patient alert, oriented, able to answer questions appropriately and recall recent events. No signs of memory loss or confusion.        08/30/2022    3:01 PM  6CIT Screen  What Year? 0 points  What month? 0 points  What time? 0 points  Count back from 20 0 points  Months in reverse 0 points  Repeat phrase 0 points  Total Score 0 points    Immunizations Immunization History  Administered Date(s) Administered   Fluad Trivalent(High Dose 65+) 12/17/2022   Influenza-Unspecified 02/14/2020, 12/14/2021   Moderna SARS-COV2 Booster Vaccination 01/16/2023   Moderna Sars-Covid-2 Vaccination 05/21/2019, 06/21/2019, 03/01/2020, 12/14/2020, 12/21/2021   PNEUMOCOCCAL CONJUGATE-20 05/21/2021   Pneumococcal-Unspecified 12/14/2021   Zoster, Unspecified 12/14/2021, 07/12/2022    Screening Tests Health Maintenance  Topic Date Due   OPHTHALMOLOGY EXAM  Never done   DTaP/Tdap/Td (1 - Tdap) Never done   COVID-19 Vaccine (6 - 2024-25 season) 03/13/2023   Zoster Vaccines- Shingrix (1 of 2) 10/09/2023 (  Originally 11/12/1947)   INFLUENZA VACCINE  10/17/2023   FOOT EXAM  12/16/2023   HEMOGLOBIN A1C  12/24/2023   Medicare Annual Wellness (AWV)  09/04/2024   Pneumococcal Vaccine: 50+ Years  Completed   DEXA SCAN  Completed   HPV VACCINES  Aged Out   Meningococcal B Vaccine  Aged Out    Health Maintenance  Health Maintenance Due  Topic Date Due   OPHTHALMOLOGY EXAM  Never done   DTaP/Tdap/Td (1 - Tdap) Never done   COVID-19 Vaccine (6 - 2024-25 season) 03/13/2023   Health Maintenance Items Addressed: DEXA ordered  Additional Screening:  Vision Screening: Recommended annual ophthalmology exams for early detection of glaucoma and other disorders of the eye. Would you like a referral to an eye doctor? No    Dental Screening: Recommended annual dental exams for  proper oral hygiene  Community Resource Referral / Chronic Care Management: CRR required this visit?  No   CCM required this visit?  No   Plan:    I have personally reviewed and noted the following in the patient's chart:   Medical and social history Use of alcohol, tobacco or illicit drugs  Current medications and supplements including opioid prescriptions. Patient is not currently taking opioid prescriptions. Functional ability and status Nutritional status Physical activity Advanced directives List of other physicians Hospitalizations, surgeries, and ER visits in previous 12 months Vitals Screenings to include cognitive, depression, and falls Referrals and appointments  In addition, I have reviewed and discussed with patient certain preventive protocols, quality metrics, and best practice recommendations. A written personalized care plan for preventive services as well as general preventive health recommendations were provided to patient.   Amy Kelly, [place]   [id number]/[id number]   After Visit Summary: (MyChart) Due to this being a telephonic visit, the after visit summary with patients personalized plan was offered to patient via MyChart   Notes: Nothing significant to report at this time.

## 2023-09-05 NOTE — Progress Notes (Signed)
   Established Patient Office Visit  Subjective   Patient ID: Amy Kelly, female    DOB: Oct 07, 1928  Age: 89 y.o. MRN: [id number]  Chief Complaint  Patient presents with   Leg Swelling    Patient presents today for follow up regarding lower extremity edema. She was seen in clinic on 6/5 for increased lower leg edema. At that time amlodipine  discontinued. Today, edema is much improved. Son reports she has been wearing compression stockings most days. Weight remains stable. Patient due to follow up with nephrology in the upcoming weeks.      Review of Systems  Constitutional:  Negative for fever, malaise/fatigue and weight loss.  Respiratory:  Positive for cough. Negative for shortness of breath and wheezing.   Cardiovascular:  Positive for leg swelling. Negative for chest pain and palpitations.  Neurological:  Positive for dizziness. Negative for headaches.      Objective:     BP (!) 174/52   Pulse 66   Temp 98.1 F (36.7 C)   Ht 5' 2 (1.575 m)   Wt 206 lb (93.4 kg)   SpO2 96%   BMI 37.68 kg/m    Physical Exam Constitutional:      Appearance: Normal appearance. She is obese.  HENT:     Head: Normocephalic and atraumatic.     Mouth/Throat:     Mouth: Mucous membranes are moist.     Pharynx: Oropharynx is clear.   Cardiovascular:     Rate and Rhythm: Normal rate and regular rhythm.     Pulses: Normal pulses.     Heart sounds: Normal heart sounds. No murmur heard. Pulmonary:     Effort: Pulmonary effort is normal.     Breath sounds: Normal breath sounds. No wheezing, rhonchi or rales.   Musculoskeletal:     Right lower leg: Edema present.     Left lower leg: Edema present.   Skin:    General: Skin is warm and dry.   Neurological:     General: No focal deficit present.     Mental Status: She is alert.     No results found for any visits on 09/05/23.  The ASCVD Risk score (Arnett DK, et al., 2019) failed to calculate for the following reasons:   The  2019 ASCVD risk score is only valid for ages 30 to 19   Risk score cannot be calculated because patient has a medical history suggesting prior/existing ASCVD    Assessment & Plan:   No follow-ups on file.   Lower extremity edema Assessment & Plan: Patient presents today for follow up regarding lower extremity edema. Symptoms much improved compared to 2 weeks ago. Continue Lasik. Follow up with Dr. Debrah Fan as scheduled.      Jearlean Mince Madox Corkins, PA-C

## 2023-09-05 NOTE — Assessment & Plan Note (Signed)
 Patient presents today for follow up regarding lower extremity edema. Symptoms much improved compared to 2 weeks ago. Continue Lasik. Follow up with Dr. Debrah Fan as scheduled.

## 2023-09-09 DIAGNOSIS — E039 Hypothyroidism, unspecified: Secondary | ICD-10-CM | POA: Diagnosis not present

## 2023-09-09 DIAGNOSIS — F32A Depression, unspecified: Secondary | ICD-10-CM | POA: Diagnosis not present

## 2023-09-09 DIAGNOSIS — R270 Ataxia, unspecified: Secondary | ICD-10-CM | POA: Diagnosis not present

## 2023-09-09 DIAGNOSIS — I5032 Chronic diastolic (congestive) heart failure: Secondary | ICD-10-CM | POA: Diagnosis not present

## 2023-09-09 DIAGNOSIS — Z794 Long term (current) use of insulin: Secondary | ICD-10-CM | POA: Diagnosis not present

## 2023-09-09 DIAGNOSIS — N184 Chronic kidney disease, stage 4 (severe): Secondary | ICD-10-CM | POA: Diagnosis not present

## 2023-09-09 DIAGNOSIS — Z7902 Long term (current) use of antithrombotics/antiplatelets: Secondary | ICD-10-CM | POA: Diagnosis not present

## 2023-09-09 DIAGNOSIS — F419 Anxiety disorder, unspecified: Secondary | ICD-10-CM | POA: Diagnosis not present

## 2023-09-09 DIAGNOSIS — Z7984 Long term (current) use of oral hypoglycemic drugs: Secondary | ICD-10-CM | POA: Diagnosis not present

## 2023-09-09 DIAGNOSIS — M810 Age-related osteoporosis without current pathological fracture: Secondary | ICD-10-CM | POA: Diagnosis not present

## 2023-09-09 DIAGNOSIS — E1122 Type 2 diabetes mellitus with diabetic chronic kidney disease: Secondary | ICD-10-CM | POA: Diagnosis not present

## 2023-09-09 DIAGNOSIS — I130 Hypertensive heart and chronic kidney disease with heart failure and stage 1 through stage 4 chronic kidney disease, or unspecified chronic kidney disease: Secondary | ICD-10-CM | POA: Diagnosis not present

## 2023-09-09 DIAGNOSIS — E11319 Type 2 diabetes mellitus with unspecified diabetic retinopathy without macular edema: Secondary | ICD-10-CM | POA: Diagnosis not present

## 2023-09-09 DIAGNOSIS — E1165 Type 2 diabetes mellitus with hyperglycemia: Secondary | ICD-10-CM | POA: Diagnosis not present

## 2023-09-09 DIAGNOSIS — Z556 Problems related to health literacy: Secondary | ICD-10-CM | POA: Diagnosis not present

## 2023-09-09 DIAGNOSIS — E1140 Type 2 diabetes mellitus with diabetic neuropathy, unspecified: Secondary | ICD-10-CM | POA: Diagnosis not present

## 2023-09-10 ENCOUNTER — Ambulatory Visit: Admitting: Endocrinology

## 2023-09-16 DIAGNOSIS — R809 Proteinuria, unspecified: Secondary | ICD-10-CM | POA: Diagnosis not present

## 2023-09-16 DIAGNOSIS — N189 Chronic kidney disease, unspecified: Secondary | ICD-10-CM | POA: Diagnosis not present

## 2023-09-16 DIAGNOSIS — D631 Anemia in chronic kidney disease: Secondary | ICD-10-CM | POA: Diagnosis not present

## 2023-09-16 DIAGNOSIS — E211 Secondary hyperparathyroidism, not elsewhere classified: Secondary | ICD-10-CM | POA: Diagnosis not present

## 2023-09-17 ENCOUNTER — Encounter: Payer: Self-pay | Admitting: Family Medicine

## 2023-09-17 ENCOUNTER — Ambulatory Visit: Admitting: Family Medicine

## 2023-09-17 VITALS — BP 178/75 | HR 67 | Temp 97.2°F | Ht 62.0 in | Wt 207.0 lb

## 2023-09-17 DIAGNOSIS — I10 Essential (primary) hypertension: Secondary | ICD-10-CM | POA: Diagnosis not present

## 2023-09-17 MED ORDER — HYDRALAZINE HCL 25 MG PO TABS: 25.0000 mg | ORAL_TABLET | Freq: Two times a day (BID) | ORAL | 1 refills | Status: DC

## 2023-09-17 NOTE — Patient Instructions (Signed)
 Medication as directed.  Follow up in 3 months.

## 2023-09-18 DIAGNOSIS — R270 Ataxia, unspecified: Secondary | ICD-10-CM | POA: Diagnosis not present

## 2023-09-18 DIAGNOSIS — Z794 Long term (current) use of insulin: Secondary | ICD-10-CM | POA: Diagnosis not present

## 2023-09-18 DIAGNOSIS — E1122 Type 2 diabetes mellitus with diabetic chronic kidney disease: Secondary | ICD-10-CM | POA: Diagnosis not present

## 2023-09-18 DIAGNOSIS — E11319 Type 2 diabetes mellitus with unspecified diabetic retinopathy without macular edema: Secondary | ICD-10-CM | POA: Diagnosis not present

## 2023-09-18 DIAGNOSIS — I5032 Chronic diastolic (congestive) heart failure: Secondary | ICD-10-CM | POA: Diagnosis not present

## 2023-09-18 DIAGNOSIS — E1165 Type 2 diabetes mellitus with hyperglycemia: Secondary | ICD-10-CM | POA: Diagnosis not present

## 2023-09-18 DIAGNOSIS — Z556 Problems related to health literacy: Secondary | ICD-10-CM | POA: Diagnosis not present

## 2023-09-18 DIAGNOSIS — F32A Depression, unspecified: Secondary | ICD-10-CM | POA: Diagnosis not present

## 2023-09-18 DIAGNOSIS — F419 Anxiety disorder, unspecified: Secondary | ICD-10-CM | POA: Diagnosis not present

## 2023-09-18 DIAGNOSIS — E039 Hypothyroidism, unspecified: Secondary | ICD-10-CM | POA: Diagnosis not present

## 2023-09-18 DIAGNOSIS — I130 Hypertensive heart and chronic kidney disease with heart failure and stage 1 through stage 4 chronic kidney disease, or unspecified chronic kidney disease: Secondary | ICD-10-CM | POA: Diagnosis not present

## 2023-09-18 DIAGNOSIS — N184 Chronic kidney disease, stage 4 (severe): Secondary | ICD-10-CM | POA: Diagnosis not present

## 2023-09-18 DIAGNOSIS — Z7902 Long term (current) use of antithrombotics/antiplatelets: Secondary | ICD-10-CM | POA: Diagnosis not present

## 2023-09-18 DIAGNOSIS — M810 Age-related osteoporosis without current pathological fracture: Secondary | ICD-10-CM | POA: Diagnosis not present

## 2023-09-18 DIAGNOSIS — Z7984 Long term (current) use of oral hypoglycemic drugs: Secondary | ICD-10-CM | POA: Diagnosis not present

## 2023-09-18 DIAGNOSIS — E1140 Type 2 diabetes mellitus with diabetic neuropathy, unspecified: Secondary | ICD-10-CM | POA: Diagnosis not present

## 2023-09-19 DIAGNOSIS — R270 Ataxia, unspecified: Secondary | ICD-10-CM | POA: Diagnosis not present

## 2023-09-19 DIAGNOSIS — F419 Anxiety disorder, unspecified: Secondary | ICD-10-CM | POA: Diagnosis not present

## 2023-09-19 DIAGNOSIS — F32A Depression, unspecified: Secondary | ICD-10-CM | POA: Diagnosis not present

## 2023-09-19 DIAGNOSIS — M810 Age-related osteoporosis without current pathological fracture: Secondary | ICD-10-CM | POA: Diagnosis not present

## 2023-09-19 DIAGNOSIS — Z7902 Long term (current) use of antithrombotics/antiplatelets: Secondary | ICD-10-CM | POA: Diagnosis not present

## 2023-09-19 DIAGNOSIS — Z7984 Long term (current) use of oral hypoglycemic drugs: Secondary | ICD-10-CM | POA: Diagnosis not present

## 2023-09-19 DIAGNOSIS — N184 Chronic kidney disease, stage 4 (severe): Secondary | ICD-10-CM | POA: Diagnosis not present

## 2023-09-19 DIAGNOSIS — E1140 Type 2 diabetes mellitus with diabetic neuropathy, unspecified: Secondary | ICD-10-CM | POA: Diagnosis not present

## 2023-09-19 DIAGNOSIS — E1165 Type 2 diabetes mellitus with hyperglycemia: Secondary | ICD-10-CM | POA: Diagnosis not present

## 2023-09-19 DIAGNOSIS — E039 Hypothyroidism, unspecified: Secondary | ICD-10-CM | POA: Diagnosis not present

## 2023-09-19 DIAGNOSIS — Z556 Problems related to health literacy: Secondary | ICD-10-CM | POA: Diagnosis not present

## 2023-09-19 DIAGNOSIS — Z794 Long term (current) use of insulin: Secondary | ICD-10-CM | POA: Diagnosis not present

## 2023-09-19 DIAGNOSIS — I130 Hypertensive heart and chronic kidney disease with heart failure and stage 1 through stage 4 chronic kidney disease, or unspecified chronic kidney disease: Secondary | ICD-10-CM | POA: Diagnosis not present

## 2023-09-19 DIAGNOSIS — I5032 Chronic diastolic (congestive) heart failure: Secondary | ICD-10-CM | POA: Diagnosis not present

## 2023-09-19 DIAGNOSIS — E1122 Type 2 diabetes mellitus with diabetic chronic kidney disease: Secondary | ICD-10-CM | POA: Diagnosis not present

## 2023-09-19 DIAGNOSIS — E11319 Type 2 diabetes mellitus with unspecified diabetic retinopathy without macular edema: Secondary | ICD-10-CM | POA: Diagnosis not present

## 2023-09-19 NOTE — Progress Notes (Signed)
 Subjective:  Patient ID: Amy Kelly, female    DOB: 1928/12/23  Age: 89 y.o. MRN: [id number]  CC:   Chief Complaint  Patient presents with   Follow-up    3 month f/u DM2, hypothyroidism    Hypertension    Elevated bp readings at home     HPI:  89 year old female presents for follow-up.  Previously, Norvasc  was discontinued due to side effects.  Blood pressure now elevated.  Home readings quite high.  Son is concerned about this.  Patient is otherwise at her baseline.  Will discuss additional pharmacotherapy for uncontrolled hypertension today.  Patient Active Problem List   Diagnosis Date Noted   Lower extremity edema 08/24/2023   History of breast cancer 08/24/2023   Sensorineural hearing loss, bilateral 02/12/2023   Neuropathy 12/18/2022   Physical deconditioning 12/18/2022   Uncontrolled type 2 diabetes mellitus with hyperglycemia (HCC) 07/07/2022   RLS (restless legs syndrome) 07/07/2022   Chronic diastolic heart failure (HCC) 07/03/2022   Constipation 02/24/2022   Urge incontinence 09/10/2021   CKD (chronic kidney disease), stage IV (HCC) 11/09/2020   HTN (hypertension) 11/08/2020   Hypothyroidism 11/08/2020   Anxiety 11/08/2020   Stroke (HCC) 11/08/2020    Social Hx   Social History   Socioeconomic History   Marital status: Widowed    Spouse name: Not on file   Number of children: Not on file   Years of education: Not on file   Highest education level: 7th grade  Occupational History   Not on file  Tobacco Use   Smoking status: Never    Passive exposure: Never   Smokeless tobacco: Never  Vaping Use   Vaping status: Never Used  Substance and Sexual Activity   Alcohol use: Never   Drug use: Never   Sexual activity: Not Currently    Birth control/protection: None  Other Topics Concern   Not on file  Social History Narrative   Right handed   Social Drivers of Health   Financial Resource Strain: Low Risk  (09/04/2023)   Overall Financial Resource  Strain (CARDIA)    Difficulty of Paying Living Expenses: Not very hard  Food Insecurity: No Food Insecurity (09/04/2023)   Hunger Vital Sign    Worried About Running Out of Food in the Last Year: Never true    Ran Out of Food in the Last Year: Never true  Transportation Needs: No Transportation Needs (09/04/2023)   PRAPARE - Administrator, Civil Service (Medical): No    Lack of Transportation (Non-Medical): No  Physical Activity: Inactive (09/04/2023)   Exercise Vital Sign    Days of Exercise per Week: 0 days    Minutes of Exercise per Session: 0 min  Stress: Stress Concern Present (09/04/2023)   [employer] of Occupational Health - Occupational Stress Questionnaire    Feeling of Stress: To some extent  Social Connections: Socially Isolated (09/04/2023)   Social Connection and Isolation Panel    Frequency of Communication with Friends and Family: Three times a week    Frequency of Social Gatherings with Friends and Family: Never    Attends Religious Services: Never    Database administrator or Organizations: No    Attends Banker Meetings: Never    Marital Status: Widowed    Review of Systems Per HPI  Objective:  BP (!) 178/75   Pulse 67   Temp (!) 97.2 F (36.2 C)   Ht 5' 2 (1.575 m)   Wt  207 lb (93.9 kg)   SpO2 95%   BMI 37.86 kg/m      09/17/2023    2:26 PM 09/05/2023    3:29 PM 09/05/2023    3:10 PM  BP/Weight  Systolic BP 178 142 142  Diastolic BP 75 82 82  Wt. (Lbs) 207  207  BMI 37.86 kg/m2  37.86 kg/m2    Physical Exam Vitals and nursing note reviewed.  Constitutional:      General: She is not in acute distress.    Appearance: Normal appearance.  HENT:     Head: Normocephalic and atraumatic.  Eyes:     General:        Right eye: No discharge.        Left eye: No discharge.     Conjunctiva/sclera: Conjunctivae normal.  Cardiovascular:     Rate and Rhythm: Normal rate and regular rhythm.  Pulmonary:     Effort: Pulmonary  effort is normal.     Breath sounds: Normal breath sounds. No wheezing or rales.  Neurological:     Mental Status: She is alert.  Psychiatric:        Mood and Affect: Mood normal.        Behavior: Behavior normal.     Lab Results  Component Value Date   WBC 9.4 03/20/2023   HGB 11.8 03/20/2023   HCT 36.6 03/20/2023   PLT 184 03/20/2023   GLUCOSE 191 (H) 08/13/2023   CHOL 120 03/20/2023   TRIG 156 (H) 03/20/2023   HDL 42 03/20/2023   LDLCALC 51 03/20/2023   ALT 18 03/20/2023   AST 18 03/20/2023   NA 140 08/13/2023   K 4.9 08/13/2023   CL 99 08/13/2023   CREATININE 2.26 (H) 08/13/2023   BUN 51 (H) 08/13/2023   CO2 24 08/13/2023   TSH 5.410 (H) 03/20/2023   HGBA1C 7.2 (A) 06/24/2023     Assessment & Plan:  Primary hypertension Assessment & Plan: Uncontrolled/worsening.  Adding hydralazine  today.  Orders: -     hydrALAZINE  HCl; Take 1 tablet (25 mg total) by mouth in the morning and at bedtime.  Dispense: 180 tablet; Refill: 1    Follow-up: Follow-up in 3 months  Arelis Neumeier Bluford DO [facility]

## 2023-09-19 NOTE — Assessment & Plan Note (Signed)
 Uncontrolled/worsening.  Adding hydralazine  today.

## 2023-09-22 DIAGNOSIS — I739 Peripheral vascular disease, unspecified: Secondary | ICD-10-CM | POA: Diagnosis not present

## 2023-09-22 DIAGNOSIS — M79671 Pain in right foot: Secondary | ICD-10-CM | POA: Diagnosis not present

## 2023-09-22 DIAGNOSIS — E1140 Type 2 diabetes mellitus with diabetic neuropathy, unspecified: Secondary | ICD-10-CM | POA: Diagnosis not present

## 2023-09-22 DIAGNOSIS — M79672 Pain in left foot: Secondary | ICD-10-CM | POA: Diagnosis not present

## 2023-09-24 DIAGNOSIS — E1122 Type 2 diabetes mellitus with diabetic chronic kidney disease: Secondary | ICD-10-CM | POA: Diagnosis not present

## 2023-09-24 DIAGNOSIS — N184 Chronic kidney disease, stage 4 (severe): Secondary | ICD-10-CM | POA: Diagnosis not present

## 2023-09-24 DIAGNOSIS — D638 Anemia in other chronic diseases classified elsewhere: Secondary | ICD-10-CM | POA: Diagnosis not present

## 2023-09-24 DIAGNOSIS — N2581 Secondary hyperparathyroidism of renal origin: Secondary | ICD-10-CM | POA: Diagnosis not present

## 2023-09-26 DIAGNOSIS — Z7984 Long term (current) use of oral hypoglycemic drugs: Secondary | ICD-10-CM | POA: Diagnosis not present

## 2023-09-26 DIAGNOSIS — F419 Anxiety disorder, unspecified: Secondary | ICD-10-CM | POA: Diagnosis not present

## 2023-09-26 DIAGNOSIS — F32A Depression, unspecified: Secondary | ICD-10-CM | POA: Diagnosis not present

## 2023-09-26 DIAGNOSIS — M810 Age-related osteoporosis without current pathological fracture: Secondary | ICD-10-CM | POA: Diagnosis not present

## 2023-09-26 DIAGNOSIS — Z7902 Long term (current) use of antithrombotics/antiplatelets: Secondary | ICD-10-CM | POA: Diagnosis not present

## 2023-09-26 DIAGNOSIS — Z794 Long term (current) use of insulin: Secondary | ICD-10-CM | POA: Diagnosis not present

## 2023-09-26 DIAGNOSIS — E039 Hypothyroidism, unspecified: Secondary | ICD-10-CM | POA: Diagnosis not present

## 2023-09-26 DIAGNOSIS — I5032 Chronic diastolic (congestive) heart failure: Secondary | ICD-10-CM | POA: Diagnosis not present

## 2023-09-26 DIAGNOSIS — N184 Chronic kidney disease, stage 4 (severe): Secondary | ICD-10-CM | POA: Diagnosis not present

## 2023-09-26 DIAGNOSIS — R270 Ataxia, unspecified: Secondary | ICD-10-CM | POA: Diagnosis not present

## 2023-09-26 DIAGNOSIS — E1140 Type 2 diabetes mellitus with diabetic neuropathy, unspecified: Secondary | ICD-10-CM | POA: Diagnosis not present

## 2023-09-26 DIAGNOSIS — I130 Hypertensive heart and chronic kidney disease with heart failure and stage 1 through stage 4 chronic kidney disease, or unspecified chronic kidney disease: Secondary | ICD-10-CM | POA: Diagnosis not present

## 2023-09-26 DIAGNOSIS — E11319 Type 2 diabetes mellitus with unspecified diabetic retinopathy without macular edema: Secondary | ICD-10-CM | POA: Diagnosis not present

## 2023-09-26 DIAGNOSIS — E1122 Type 2 diabetes mellitus with diabetic chronic kidney disease: Secondary | ICD-10-CM | POA: Diagnosis not present

## 2023-09-26 DIAGNOSIS — Z556 Problems related to health literacy: Secondary | ICD-10-CM | POA: Diagnosis not present

## 2023-09-26 DIAGNOSIS — E1165 Type 2 diabetes mellitus with hyperglycemia: Secondary | ICD-10-CM | POA: Diagnosis not present

## 2023-09-29 ENCOUNTER — Ambulatory Visit: Payer: Self-pay | Admitting: Family Medicine

## 2023-09-29 ENCOUNTER — Ambulatory Visit (HOSPITAL_COMMUNITY)
Admission: RE | Admit: 2023-09-29 | Discharge: 2023-09-29 | Disposition: A | Discharge status: Home / Self Care | Source: Ambulatory Visit | Attending: Family Medicine | Admitting: Family Medicine

## 2023-09-29 DIAGNOSIS — Z780 Asymptomatic menopausal state: Secondary | ICD-10-CM | POA: Diagnosis not present

## 2023-09-29 DIAGNOSIS — M8589 Other specified disorders of bone density and structure, multiple sites: Secondary | ICD-10-CM | POA: Diagnosis not present

## 2023-09-30 DIAGNOSIS — F419 Anxiety disorder, unspecified: Secondary | ICD-10-CM | POA: Diagnosis not present

## 2023-09-30 DIAGNOSIS — R270 Ataxia, unspecified: Secondary | ICD-10-CM | POA: Diagnosis not present

## 2023-09-30 DIAGNOSIS — E1140 Type 2 diabetes mellitus with diabetic neuropathy, unspecified: Secondary | ICD-10-CM | POA: Diagnosis not present

## 2023-09-30 DIAGNOSIS — Z7902 Long term (current) use of antithrombotics/antiplatelets: Secondary | ICD-10-CM | POA: Diagnosis not present

## 2023-09-30 DIAGNOSIS — E11319 Type 2 diabetes mellitus with unspecified diabetic retinopathy without macular edema: Secondary | ICD-10-CM | POA: Diagnosis not present

## 2023-09-30 DIAGNOSIS — Z556 Problems related to health literacy: Secondary | ICD-10-CM | POA: Diagnosis not present

## 2023-09-30 DIAGNOSIS — Z794 Long term (current) use of insulin: Secondary | ICD-10-CM | POA: Diagnosis not present

## 2023-09-30 DIAGNOSIS — F32A Depression, unspecified: Secondary | ICD-10-CM | POA: Diagnosis not present

## 2023-09-30 DIAGNOSIS — I5032 Chronic diastolic (congestive) heart failure: Secondary | ICD-10-CM | POA: Diagnosis not present

## 2023-09-30 DIAGNOSIS — N184 Chronic kidney disease, stage 4 (severe): Secondary | ICD-10-CM | POA: Diagnosis not present

## 2023-09-30 DIAGNOSIS — E1165 Type 2 diabetes mellitus with hyperglycemia: Secondary | ICD-10-CM | POA: Diagnosis not present

## 2023-09-30 DIAGNOSIS — E1122 Type 2 diabetes mellitus with diabetic chronic kidney disease: Secondary | ICD-10-CM | POA: Diagnosis not present

## 2023-09-30 DIAGNOSIS — Z7984 Long term (current) use of oral hypoglycemic drugs: Secondary | ICD-10-CM | POA: Diagnosis not present

## 2023-09-30 DIAGNOSIS — I130 Hypertensive heart and chronic kidney disease with heart failure and stage 1 through stage 4 chronic kidney disease, or unspecified chronic kidney disease: Secondary | ICD-10-CM | POA: Diagnosis not present

## 2023-09-30 DIAGNOSIS — E039 Hypothyroidism, unspecified: Secondary | ICD-10-CM | POA: Diagnosis not present

## 2023-09-30 DIAGNOSIS — M810 Age-related osteoporosis without current pathological fracture: Secondary | ICD-10-CM | POA: Diagnosis not present

## 2023-10-06 ENCOUNTER — Ambulatory Visit: Payer: Self-pay

## 2023-10-06 NOTE — Telephone Encounter (Signed)
 FYI Only or Action Required?: FYI only for provider.  Patient was last seen in primary care on 09/17/2023 by Cook, Jayce G, DO.  Called Nurse Triage reporting Arm Pain and arm pit pain.  Symptoms began several weeks ago.  Interventions attempted: Nothing.  Symptoms are: stable.  Triage Disposition: See PCP When Office is Open (Within 3 Days)  Patient/caregiver understands and will follow disposition?: Yes            Reason for Disposition  [1] MODERATE pain (e.g., interferes with normal activities) AND [2] present > 3 days  Answer Assessment - Initial Assessment Questions 1. ONSET: When did the pain start?     A few weeks ago 2. LOCATION: Where is the pain located?     Right arm and arm pit 3. PAIN: How bad is the pain? (Scale 0-10; or none, mild, moderate, severe)     Enough to wake her up at night 4. WORK OR EXERCISE: Has there been any recent work or exercise that involved this part of the body?     no 5. CAUSE: What do you think is causing the arm pain?     unsure 6. OTHER SYMPTOMS: Do you have any other symptoms? (e.g., neck pain, swelling, rash, fever, numbness, weakness)     no  Protocols used: Arm Pain-A-AH

## 2023-10-06 NOTE — Telephone Encounter (Signed)
 Copied from CRM [id number]. Topic: Clinical - Red Word Triage >> Oct 06, 2023  2:11 PM Selinda RAMAN wrote: Red Word that prompted transfer to Nurse Triage: [name] the son of the patient called in stating his mother has been complaining of severe right arm pain near her arm pit. It has been keeping her up at night. She had a previous mastectomy and they took out 28 lymph nodes at the time but she tells him it feels like something in her lymph nodes. He is on her Dpr list and the patient is deaf. He is not currently with her but I will transfer him to [place] [facility]

## 2023-10-08 ENCOUNTER — Ambulatory Visit (HOSPITAL_COMMUNITY)
Admission: RE | Admit: 2023-10-08 | Discharge: 2023-10-08 | Disposition: A | Discharge status: Home / Self Care | Source: Ambulatory Visit | Attending: Physician Assistant | Admitting: Physician Assistant

## 2023-10-08 ENCOUNTER — Ambulatory Visit: Payer: Self-pay | Admitting: Physician Assistant

## 2023-10-08 ENCOUNTER — Ambulatory Visit: Admitting: Physician Assistant

## 2023-10-08 ENCOUNTER — Encounter: Payer: Self-pay | Admitting: Physician Assistant

## 2023-10-08 VITALS — BP 127/69 | Ht 62.0 in | Wt 205.0 lb

## 2023-10-08 DIAGNOSIS — M79601 Pain in right arm: Secondary | ICD-10-CM | POA: Insufficient documentation

## 2023-10-08 DIAGNOSIS — M85811 Other specified disorders of bone density and structure, right shoulder: Secondary | ICD-10-CM | POA: Diagnosis not present

## 2023-10-08 DIAGNOSIS — M25711 Osteophyte, right shoulder: Secondary | ICD-10-CM | POA: Diagnosis not present

## 2023-10-08 DIAGNOSIS — M25511 Pain in right shoulder: Secondary | ICD-10-CM | POA: Diagnosis not present

## 2023-10-08 DIAGNOSIS — M19011 Primary osteoarthritis, right shoulder: Secondary | ICD-10-CM | POA: Diagnosis not present

## 2023-10-08 NOTE — Assessment & Plan Note (Signed)
 Patient presents today with 2-3 weeks of right arm and shoulder pain. Patient appears well today. Generalized tenderness of palpation of right shoulder. Good ROM. No skin color changes, no erythema or bruising. No localized swelling or warmth. Right shoulder imaging today. Continue tylenol  for pain. Advised alternating ice and heat for pain relief.

## 2023-10-08 NOTE — Progress Notes (Signed)
   Acute Office Visit  Subjective:     Patient ID: Amy Kelly, female    DOB: 1929/03/15, 89 y.o.   MRN: [id number]   Patient presents today with right arm and shoulder pain. She relates symptoms have been ongoing for 2-3 weeks. She relates pain to the last time she had blood work done, stating pain started after they took her blood. She has a history of chronic pain, currently on gabapentin  and has been taking tylenol  as needed for pain, as well as icing as needed for pain relief. Relates pain is worse at night, but better if she lays on her right side. Denies recent falls.      Review of Systems  Constitutional:  Negative for fever, malaise/fatigue and weight loss.  Musculoskeletal:  Positive for back pain, joint pain and neck pain. Negative for falls and myalgias.  Neurological:  Negative for dizziness, loss of consciousness and headaches.        Objective:     Ht 5' 2 (1.575 m)   BMI 37.86 kg/m   Physical Exam Constitutional:      Appearance: Normal appearance. She is obese.  HENT:     Head: Normocephalic and atraumatic.  Cardiovascular:     Rate and Rhythm: Normal rate and regular rhythm.     Heart sounds: Normal heart sounds. No murmur heard. Pulmonary:     Effort: Pulmonary effort is normal.     Breath sounds: Normal breath sounds. No wheezing or rales.  Musculoskeletal:     Right shoulder: Tenderness and bony tenderness present. No swelling, deformity or crepitus. Normal range of motion.     Right upper arm: No swelling, edema, deformity or tenderness.  Skin:    General: Skin is warm and dry.  Neurological:     General: No focal deficit present.     Mental Status: She is alert.  Psychiatric:        Mood and Affect: Mood normal.     No results found for any visits on 10/08/23.      Assessment & Plan:  Right arm pain Assessment & Plan: Patient presents today with 2-3 weeks of right arm and shoulder pain. Patient appears well today. Generalized  tenderness of palpation of right shoulder. Good ROM. No skin color changes, no erythema or bruising. No localized swelling or warmth. Right shoulder imaging today. Continue tylenol  for pain. Advised alternating ice and heat for pain relief.   Orders: -     DG Shoulder Right    Return if symptoms worsen or fail to improve.  Charmaine Hesham [place], PA-C

## 2023-10-09 DIAGNOSIS — I5032 Chronic diastolic (congestive) heart failure: Secondary | ICD-10-CM | POA: Diagnosis not present

## 2023-10-09 DIAGNOSIS — E039 Hypothyroidism, unspecified: Secondary | ICD-10-CM | POA: Diagnosis not present

## 2023-10-09 DIAGNOSIS — Z7984 Long term (current) use of oral hypoglycemic drugs: Secondary | ICD-10-CM | POA: Diagnosis not present

## 2023-10-09 DIAGNOSIS — N184 Chronic kidney disease, stage 4 (severe): Secondary | ICD-10-CM | POA: Diagnosis not present

## 2023-10-09 DIAGNOSIS — I130 Hypertensive heart and chronic kidney disease with heart failure and stage 1 through stage 4 chronic kidney disease, or unspecified chronic kidney disease: Secondary | ICD-10-CM | POA: Diagnosis not present

## 2023-10-09 DIAGNOSIS — F32A Depression, unspecified: Secondary | ICD-10-CM | POA: Diagnosis not present

## 2023-10-09 DIAGNOSIS — E1165 Type 2 diabetes mellitus with hyperglycemia: Secondary | ICD-10-CM | POA: Diagnosis not present

## 2023-10-09 DIAGNOSIS — F419 Anxiety disorder, unspecified: Secondary | ICD-10-CM | POA: Diagnosis not present

## 2023-10-09 DIAGNOSIS — E1140 Type 2 diabetes mellitus with diabetic neuropathy, unspecified: Secondary | ICD-10-CM | POA: Diagnosis not present

## 2023-10-09 DIAGNOSIS — Z556 Problems related to health literacy: Secondary | ICD-10-CM | POA: Diagnosis not present

## 2023-10-09 DIAGNOSIS — Z7902 Long term (current) use of antithrombotics/antiplatelets: Secondary | ICD-10-CM | POA: Diagnosis not present

## 2023-10-09 DIAGNOSIS — R270 Ataxia, unspecified: Secondary | ICD-10-CM | POA: Diagnosis not present

## 2023-10-09 DIAGNOSIS — Z794 Long term (current) use of insulin: Secondary | ICD-10-CM | POA: Diagnosis not present

## 2023-10-09 DIAGNOSIS — E1122 Type 2 diabetes mellitus with diabetic chronic kidney disease: Secondary | ICD-10-CM | POA: Diagnosis not present

## 2023-10-09 DIAGNOSIS — M810 Age-related osteoporosis without current pathological fracture: Secondary | ICD-10-CM | POA: Diagnosis not present

## 2023-10-09 DIAGNOSIS — E11319 Type 2 diabetes mellitus with unspecified diabetic retinopathy without macular edema: Secondary | ICD-10-CM | POA: Diagnosis not present

## 2023-10-15 ENCOUNTER — Ambulatory Visit: Payer: Medicare Other | Admitting: Urology

## 2023-10-15 ENCOUNTER — Encounter: Payer: Self-pay | Admitting: Urology

## 2023-10-15 VITALS — BP 122/56 | HR 70

## 2023-10-15 DIAGNOSIS — N3941 Urge incontinence: Secondary | ICD-10-CM | POA: Diagnosis not present

## 2023-10-15 DIAGNOSIS — Z87440 Personal history of urinary (tract) infections: Secondary | ICD-10-CM | POA: Diagnosis not present

## 2023-10-15 LAB — URINALYSIS, ROUTINE W REFLEX MICROSCOPIC
Bilirubin, UA: NEGATIVE
Glucose, UA: NEGATIVE
Ketones, UA: NEGATIVE
Nitrite, UA: NEGATIVE
Protein,UA: NEGATIVE
RBC, UA: NEGATIVE
Specific Gravity, UA: 1.015 (ref 1.005–1.030)
Urobilinogen, Ur: 0.2 mg/dL (ref 0.2–1.0)
pH, UA: 6 (ref 5.0–7.5)

## 2023-10-15 LAB — MICROSCOPIC EXAMINATION: WBC, UA: >30 /HPF — ABNORMAL HIGH (ref 0–5)

## 2023-10-15 NOTE — Progress Notes (Signed)
 10/15/2023 2:03 PM   Amy Kelly 1928-04-23 [id number]  Referring provider: Cook, Amy G, DO [address] [place] Amy [place],  [place] [address]  Followup OAB and UTI   HPI: Amy Kelly is a 89yo here for followup of OAB and recurrent UTI. She is on lasix  80mg . She remains on gemtesa  75mg  daily which does improve her urgency and urge incontinence. No UTis since last visit. No dysuria and hematuria   PMH: Past Medical History:  Diagnosis Date   Allergy    Anxiety and depression    Arthritis    At risk for falls    Cancer [facility])    Cataract    CHF (congestive heart failure) (HCC)    Chronic kidney disease    Coronary artery disease    Diabetes mellitus (HCC) 11/08/2020   Diabetes mellitus without complication (HCC)    GERD (gastroesophageal reflux disease)    Heart murmur    History of skin cancer    Right shin   HOH (hard of hearing)    Hyperlipidemia    Hypertension    Hypothyroidism    Neuromuscular disorder (HCC)    Osteoporosis    Oxygen deficiency    Stroke [facility])    Ulcer     Surgical History: Past Surgical History:  Procedure Laterality Date   ABDOMINAL HYSTERECTOMY     APPENDECTOMY     BREAST SURGERY     CARDIAC CATHETERIZATION     [phone]   CARDIAC CATHETERIZATION     x 2 stents   CARPAL TUNNEL RELEASE Left    CERVICAL SPINE SURGERY     CHOLECYSTECTOMY  1967   DIAGNOSTIC MAMMOGRAM     dnc     ESOPHAGEAL DILATION N/A 04/09/2022   Procedure: ESOPHAGEAL DILATION;  Surgeon: Amy Angelia Sieving, MD;  [facility];  Service: Gastroenterology;  Laterality: N/A;   ESOPHAGOGASTRODUODENOSCOPY (EGD) WITH PROPOFOL  N/A 04/09/2022   Procedure: ESOPHAGOGASTRODUODENOSCOPY (EGD) WITH PROPOFOL ;  Surgeon: Amy Angelia Sieving, MD;  Location: [facility];  Service: Gastroenterology;  Laterality: N/A;  2:30pm, asa 3   EYE SURGERY     HERNIA REPAIR     HYSTERECTOMY ABDOMINAL WITH SALPINGO-OOPHORECTOMY  1960   MASTECTOMY Right 2012   SKIN  CANCER EXCISION     SPINE SURGERY     THORACIC SPINE SURGERY      Home Medications:  Allergies as of 10/15/2023       Reactions   Fesoterodine     Severe dry mouth   Penicillins Diarrhea, Nausea Only   Prednisone Diarrhea, Nausea Only   Solifenacin     Severe dry mouth   Sulfa Antibiotics Diarrhea, Nausea Only        Medication List        Accurate as of October 15, 2023  2:03 PM. If you have any questions, ask your nurse or doctor.          B-complex with vitamin C tablet Take 1 tablet by mouth in the morning.   Blood Glucose Monitoring Suppl Devi Use up to 4 times daily to check blood sugar. May substitute to any manufacturer covered by patient's insurance.   calcitRIOL 0.25 MCG capsule Commonly known as: ROCALTROL Take 0.25 mcg by mouth every Monday, Wednesday, and Friday.   CALCIUM  GUMMIES PO Take 2 tablets by mouth in the morning.   clobetasol 0.05 % external solution Commonly known as: TEMOVATE Apply 1 Application topically in the morning. Applied to the back of the neck  clopidogrel  75 MG tablet Commonly known as: PLAVIX  Take 1 tablet (75 mg total) by mouth in the morning.   cyanocobalamin  500 MCG tablet Commonly known as: VITAMIN B12 Take 1,000 mcg by mouth in the morning.   doxazosin  2 MG tablet Commonly known as: CARDURA  Take 1 tablet (2 mg total) by mouth at bedtime.   escitalopram  20 MG tablet Commonly known as: LEXAPRO  TAKE 1 TABLET BY MOUTH DAILY   estradiol  0.1 MG/GM vaginal cream Commonly known as: ESTRACE  VAGINAL Place 1 Applicatorful vaginally at bedtime. For 2 weeks, then 3 times a week.   famotidine  20 MG tablet Commonly known as: PEPCID  Take 1 tablet (20 mg total) by mouth at bedtime.   folic acid  800 MCG tablet Commonly known as: FOLVITE  Take 800 mcg by mouth in the morning.   FreeStyle Libre 3 Reader [place] Use to check blood sugar continuously.   FreeStyle Libre 3 Sensor Misc Place 1 sensor on the skin every 14 days. Use  to check glucose continuously   furosemide  40 MG tablet Commonly known as: LASIX  2 q am What changed:  how much to take how to take this when to take this   gabapentin  100 MG capsule Commonly known as: NEURONTIN  TAKE 1 CAPSULE BY MOUTH IN THE MORNING AND 2 CAPSULES BY MOUTH AT NIGHT   Gemtesa  75 MG Tabs Generic drug: Vibegron  Take 1 tablet (75 mg total) by mouth daily.   hydrALAZINE  25 MG tablet Commonly known as: APRESOLINE  Take 1 tablet (25 mg total) by mouth in the morning and at bedtime.   insulin  lispro 100 UNIT/ML KwikPen Commonly known as: HumaLOG  KwikPen INJECT 1 TO 10 UNITS UNDER THE SKIN THREE TIMES DAILY BEFORE MEALS PER SLIDING SCALE   Insulin  Pen Needle 31G X 8 MM Misc Use 4 times daily for administration of insulin .   Lantus  SoloStar 100 UNIT/ML Solostar Pen Generic drug: insulin  glargine INJECT 30 UNITS UNDER THE SKIN EVERY DAY   levothyroxine  75 MCG tablet Commonly known as: SYNTHROID  TAKE 1 TABLET BY MOUTH DAILY BEFORE BREAKFAST   linagliptin  5 MG Tabs tablet Commonly known as: TRADJENTA  Take 1 tablet (5 mg total) by mouth daily.   loratadine 10 MG tablet Commonly known as: CLARITIN Take 10 mg by mouth in the morning.   multivitamin with minerals Tabs tablet Take 1 tablet by mouth in the morning.   omeprazole  40 MG capsule Commonly known as: PRILOSEC Take 1 capsule (40 mg total) by mouth daily before breakfast.   OneTouch Ultra test strip Generic drug: glucose blood USE UP TO 4 TIMES DAILY TO CHECK BLOOD SUGAR   PATADAY OP Place 1 drop into both eyes in the morning. Once in the mornings in left eye   polyethylene glycol 17 g packet Commonly known as: MIRALAX  / GLYCOLAX  Take 17 g by mouth every other day.   potassium chloride  10 MEQ tablet Commonly known as: KLOR-CON  Take 1 tablet (10 mEq total) by mouth daily.   rOPINIRole  2 MG tablet Commonly known as: REQUIP  Take 1 tablet (2 mg total) by mouth at bedtime.   rosuvastatin  40 MG  tablet Commonly known as: CRESTOR  Take 1 tablet (40 mg total) by mouth at bedtime.   VITAMIN C GUMMIE PO Take 1,000 mg by mouth in the morning.        Allergies:  Allergies  Allergen Reactions   Fesoterodine      Severe dry mouth   Penicillins Diarrhea and Nausea Only   Prednisone Diarrhea and Nausea Only  Solifenacin      Severe dry mouth   Sulfa Antibiotics Diarrhea and Nausea Only    Family History: Family History  Problem Relation Age of Onset   Heart disease Father    Cancer Brother     Social History:  reports that she has never smoked. She has never been exposed to tobacco smoke. She has never used smokeless tobacco. She reports that she does not drink alcohol and does not use drugs.  ROS: All other review of systems were reviewed and are negative except what is noted above in HPI  Physical Exam: BP (!) 122/56   Pulse 70   Constitutional:  Alert and oriented, No acute distress. HEENT: [place] AT, moist mucus membranes.  Trachea midline, no masses. Cardiovascular: No clubbing, cyanosis, or edema. Respiratory: Normal respiratory effort, no increased work of breathing. GI: Abdomen is soft, nontender, nondistended, no abdominal masses GU: No CVA tenderness.  Lymph: No cervical or inguinal lymphadenopathy. Skin: No rashes, bruises or suspicious lesions. Neurologic: Grossly intact, no focal deficits, moving all 4 extremities. Psychiatric: Normal mood and affect.  Laboratory Data: Lab Results  Component Value Date   WBC 9.4 03/20/2023   HGB 11.8 03/20/2023   HCT 36.6 03/20/2023   MCV 95 03/20/2023   PLT 184 03/20/2023    Lab Results  Component Value Date   CREATININE 2.26 (H) 08/13/2023    No results found for: PSA  No results found for: TESTOSTERONE  Lab Results  Component Value Date   HGBA1C 7.2 (A) 06/24/2023    Urinalysis    Component Value Date/Time   COLORURINE YELLOW 01/28/2022 1601   APPEARANCEUR Clear 10/07/2022 1526   LABSPEC 1.005  01/28/2022 1601   PHURINE 5.0 01/28/2022 1601   GLUCOSEU Negative 10/07/2022 1526   HGBUR LARGE (A) 01/28/2022 1601   BILIRUBINUR negative 12/17/2022 1332   BILIRUBINUR Negative 10/07/2022 1526   KETONESUR negative 12/17/2022 1332   KETONESUR NEGATIVE 01/28/2022 1601   PROTEINUR Negative 10/07/2022 1526   PROTEINUR 100 (A) 01/28/2022 1601   UROBILINOGEN 0.2 12/17/2022 1332   NITRITE Negative 12/17/2022 1332   NITRITE Negative 10/07/2022 1526   NITRITE NEGATIVE 01/28/2022 1601   LEUKOCYTESUR Negative 12/17/2022 1332   LEUKOCYTESUR Negative 10/07/2022 1526   LEUKOCYTESUR LARGE (A) 01/28/2022 1601    Lab Results  Component Value Date   LABMICR 22.5 03/20/2023   WBCUA 0-5 10/08/2021   LABEPIT 0-10 10/08/2021   MUCUS Present 10/08/2021   BACTERIA MANY (A) 01/28/2022    Pertinent Imaging:  Results for orders placed in visit on 03/20/23  DG Abd 1 View  Narrative CLINICAL DATA:  Abdominal pain.  EXAM: ABDOMEN - 1 VIEW  COMPARISON:  None Available.  FINDINGS: The bowel gas pattern is normal without gaseous distention. No radio-opaque calculi or other significant radiographic abnormality are seen. Increased stool noted consistent with constipation. Postop changes L3-L5 posterior fusion.  IMPRESSION: Constipation.   Electronically Signed By: Fonda Field M.D. On: 05/09/2023 16:41  Results for orders placed during the hospital encounter of 11/08/20  US  Venous Img Lower Bilateral (DVT)  Narrative CLINICAL DATA:  Bilateral calf edema and pain  EXAM: BILATERAL LOWER EXTREMITY VENOUS DOPPLER ULTRASOUND  TECHNIQUE: Gray-scale sonography with graded compression, as well as color Doppler and duplex ultrasound were performed to evaluate the lower extremity deep venous systems from the level of the common femoral vein and including the common femoral, femoral, profunda femoral, popliteal and calf veins including the posterior tibial, peroneal and gastrocnemius  veins when  visible. The superficial great saphenous vein was also interrogated. Spectral Doppler was utilized to evaluate flow at rest and with distal augmentation maneuvers in the common femoral, femoral and popliteal veins.  COMPARISON:  None.  FINDINGS: RIGHT LOWER EXTREMITY  Common Femoral Vein: No evidence of thrombus. Normal compressibility, respiratory phasicity and response to augmentation.  Saphenofemoral Junction: No evidence of thrombus. Normal compressibility and flow on color Doppler imaging.  Profunda Femoral Vein: No evidence of thrombus. Normal compressibility and flow on color Doppler imaging.  Femoral Vein: No evidence of thrombus. Normal compressibility, respiratory phasicity and response to augmentation.  Popliteal Vein: No evidence of thrombus. Normal compressibility, respiratory phasicity and response to augmentation.  Calf Veins: Limited visualization of the calf veins because of body habitus and peripheral edema. No gross thrombus appreciated.  Superficial Great Saphenous Vein: No evidence of thrombus. Normal compressibility.  LEFT LOWER EXTREMITY  Common Femoral Vein: No evidence of thrombus. Normal compressibility, respiratory phasicity and response to augmentation.  Saphenofemoral Junction: No evidence of thrombus. Normal compressibility and flow on color Doppler imaging.  Profunda Femoral Vein: No evidence of thrombus. Normal compressibility and flow on color Doppler imaging.  Femoral Vein: No evidence of thrombus. Normal compressibility, respiratory phasicity and response to augmentation.  Popliteal Vein: No evidence of thrombus. Normal compressibility, respiratory phasicity and response to augmentation.  Calf Veins: Limited visualization of the calf veins because of body habitus and edema. No gross thrombus appreciated.  Superficial Great Saphenous Vein: No evidence of thrombus. Normal compressibility.  Other Findings: Complex  septated left popliteal fossa Baker's cyst measures 3.6 x 2.4 x 3.5 cm  IMPRESSION: Limited exam because of body habitus and peripheral edema.  No significant acute occlusive DVT in either extremity.  3.6 cm left popliteal fossa complex Baker's cyst.   Electronically Signed By: CHRISTELLA.  Shick M.D. On: 11/11/2020 07:22  No results found for this or any previous visit.  No results found for this or any previous visit.  Results for orders placed during the hospital encounter of 07/24/21  US  RENAL  Narrative CLINICAL DATA:  Acute renal failure with tubular necrosis  Type 2 diabetes  EXAM: RENAL / URINARY TRACT ULTRASOUND COMPLETE  COMPARISON:  07/02/2021  FINDINGS: Right Kidney:  Renal measurements: 10.5 x 4.7 x 5.6 cm = volume: 142 mL. Diffusely thin echogenic cortex. No hydronephrosis. Multiple simple cysts are seen measuring up to 2 cm.  Left Kidney:  Renal measurements: 10.1 x 4.9 x 4.9 cm = volume: 127 mL. Diffusely thinned echogenic cortex. Multiple simple cysts are present measuring up to 1.3 cm.  Bladder:  Appears normal for degree of bladder distention.  Other:  None.  IMPRESSION: Thinned echogenic renal cortices consistent with chronic medical renal disease.   Electronically Signed By: Aliene Lloyd M.D. On: 07/25/2021 14:21  No results found for this or any previous visit.  No results found for this or any previous visit.  No results found for this or any previous visit.   Assessment & Plan:    1. Urge incontinence (Primary) -continue gemtesa  75mg  daily - Urinalysis, Routine w reflex microscopic  2. History of UTI Urine for culture, will call with results   No follow-ups on file.  Belvie Clara, MD  [facility] [place]

## 2023-10-15 NOTE — Patient Instructions (Signed)

## 2023-10-16 DIAGNOSIS — R270 Ataxia, unspecified: Secondary | ICD-10-CM | POA: Diagnosis not present

## 2023-10-16 DIAGNOSIS — E1140 Type 2 diabetes mellitus with diabetic neuropathy, unspecified: Secondary | ICD-10-CM | POA: Diagnosis not present

## 2023-10-16 DIAGNOSIS — E11319 Type 2 diabetes mellitus with unspecified diabetic retinopathy without macular edema: Secondary | ICD-10-CM | POA: Diagnosis not present

## 2023-10-16 DIAGNOSIS — E1165 Type 2 diabetes mellitus with hyperglycemia: Secondary | ICD-10-CM | POA: Diagnosis not present

## 2023-10-16 DIAGNOSIS — Z556 Problems related to health literacy: Secondary | ICD-10-CM | POA: Diagnosis not present

## 2023-10-16 DIAGNOSIS — Z7984 Long term (current) use of oral hypoglycemic drugs: Secondary | ICD-10-CM | POA: Diagnosis not present

## 2023-10-16 DIAGNOSIS — I5032 Chronic diastolic (congestive) heart failure: Secondary | ICD-10-CM | POA: Diagnosis not present

## 2023-10-16 DIAGNOSIS — Z7902 Long term (current) use of antithrombotics/antiplatelets: Secondary | ICD-10-CM | POA: Diagnosis not present

## 2023-10-16 DIAGNOSIS — F419 Anxiety disorder, unspecified: Secondary | ICD-10-CM | POA: Diagnosis not present

## 2023-10-16 DIAGNOSIS — Z794 Long term (current) use of insulin: Secondary | ICD-10-CM | POA: Diagnosis not present

## 2023-10-16 DIAGNOSIS — E1122 Type 2 diabetes mellitus with diabetic chronic kidney disease: Secondary | ICD-10-CM | POA: Diagnosis not present

## 2023-10-16 DIAGNOSIS — I130 Hypertensive heart and chronic kidney disease with heart failure and stage 1 through stage 4 chronic kidney disease, or unspecified chronic kidney disease: Secondary | ICD-10-CM | POA: Diagnosis not present

## 2023-10-16 DIAGNOSIS — F32A Depression, unspecified: Secondary | ICD-10-CM | POA: Diagnosis not present

## 2023-10-16 DIAGNOSIS — N184 Chronic kidney disease, stage 4 (severe): Secondary | ICD-10-CM | POA: Diagnosis not present

## 2023-10-16 DIAGNOSIS — M810 Age-related osteoporosis without current pathological fracture: Secondary | ICD-10-CM | POA: Diagnosis not present

## 2023-10-16 DIAGNOSIS — E039 Hypothyroidism, unspecified: Secondary | ICD-10-CM | POA: Diagnosis not present

## 2023-10-18 LAB — URINE CULTURE

## 2023-10-24 ENCOUNTER — Ambulatory Visit: Payer: Self-pay

## 2023-10-26 ENCOUNTER — Other Ambulatory Visit: Payer: Self-pay | Admitting: Urology

## 2023-10-26 MED ORDER — CEPHALEXIN 500 MG PO CAPS: 500.0000 mg | ORAL_CAPSULE | Freq: Two times a day (BID) | ORAL | 0 refills | Status: DC

## 2023-10-27 NOTE — Telephone Encounter (Signed)
 I called son and notified of antibiotic sent in for positive urine culture. Voiced understanding.

## 2023-10-29 ENCOUNTER — Other Ambulatory Visit: Payer: Self-pay | Admitting: Family Medicine

## 2023-10-29 MED ORDER — ROPINIROLE HCL 2 MG PO TABS: 2.0000 mg | ORAL_TABLET | Freq: Every evening | ORAL | 1 refills | Status: AC

## 2023-11-10 ENCOUNTER — Other Ambulatory Visit: Payer: Self-pay | Admitting: Family Medicine

## 2023-11-13 ENCOUNTER — Other Ambulatory Visit: Payer: Self-pay | Admitting: Family Medicine

## 2023-11-22 IMAGING — US US RENAL
1 series · 14 of 25 positions shown · non-contrast
Comparison: December 15, 2020

CLINICAL DATA: Acute renal failure.  Six-month followup.

EXAM:
RENAL / URINARY TRACT ULTRASOUND COMPLETE

[Series 1: us renal · 14 of 48 slices shown]
[im 1/48]
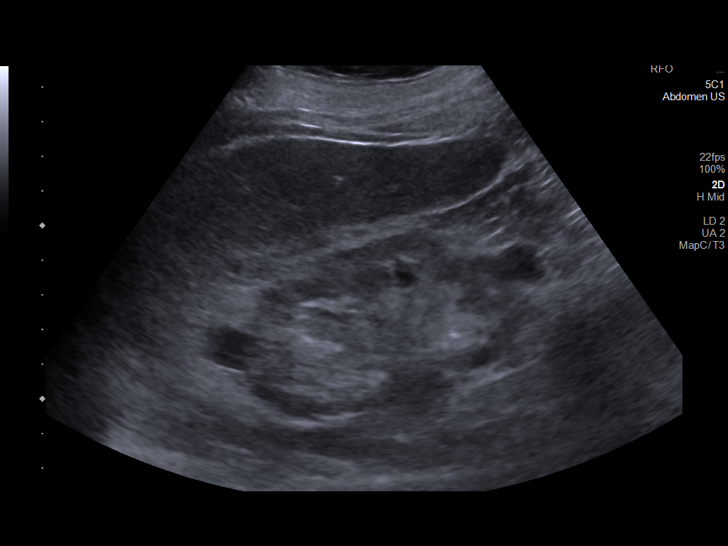
[im 4/48]
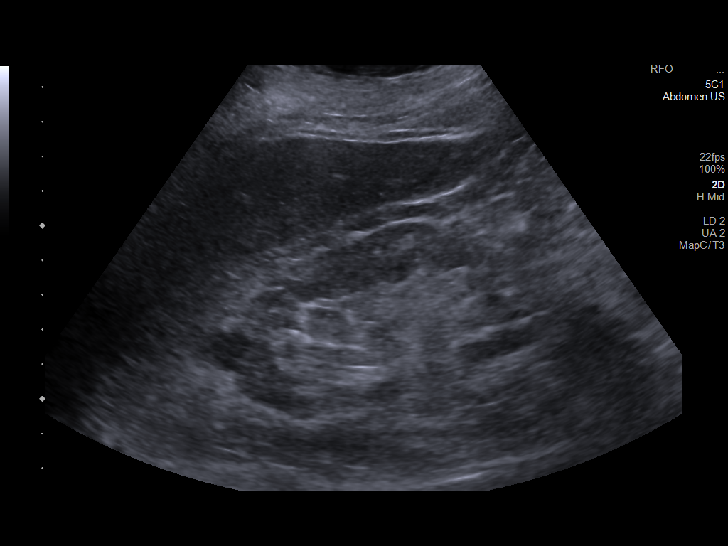
[im 8/48]
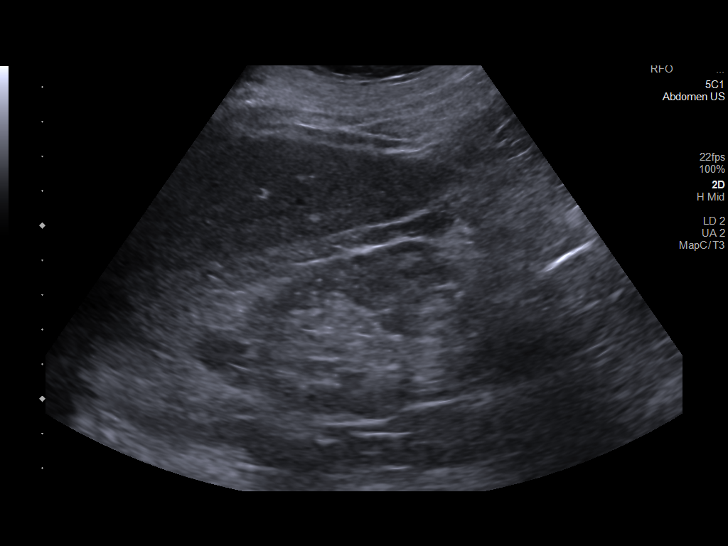
[im 12/48]
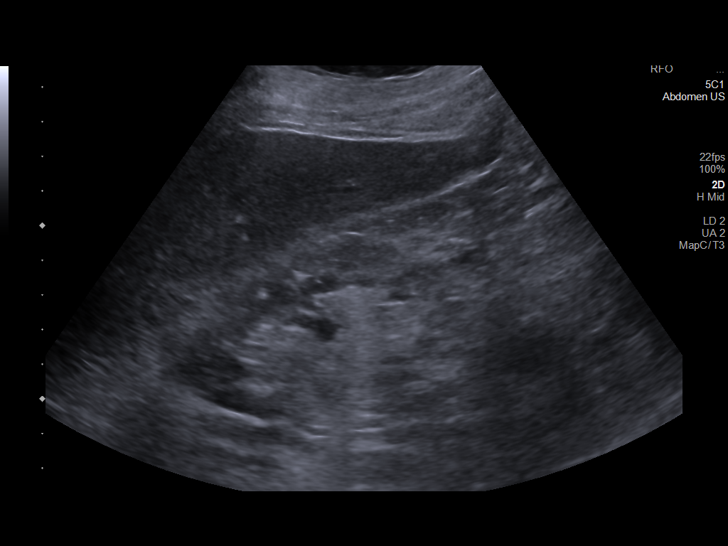
[im 16/48]
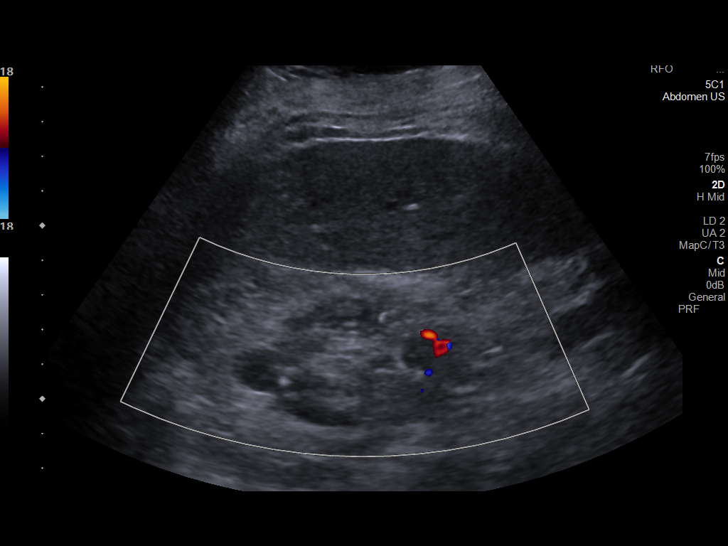
[im 18/48]
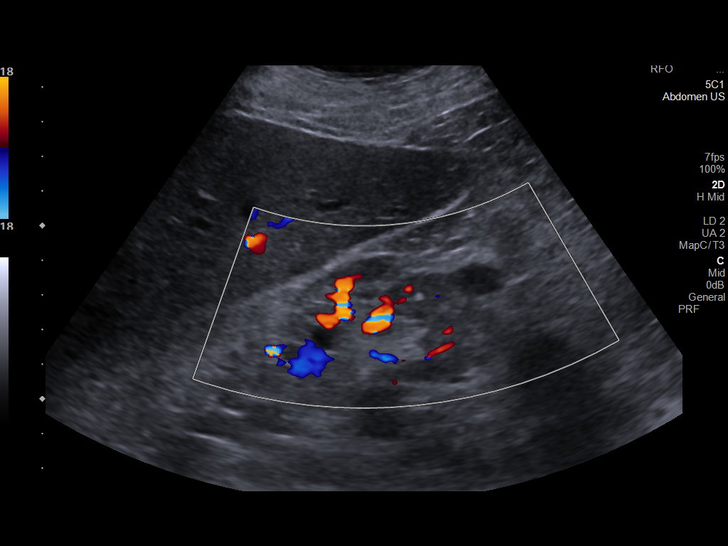
[im 22/48]
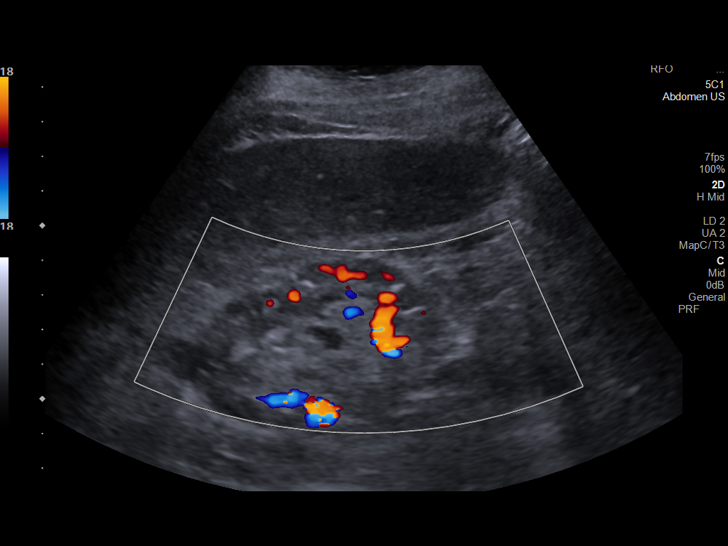
[im 26/48]
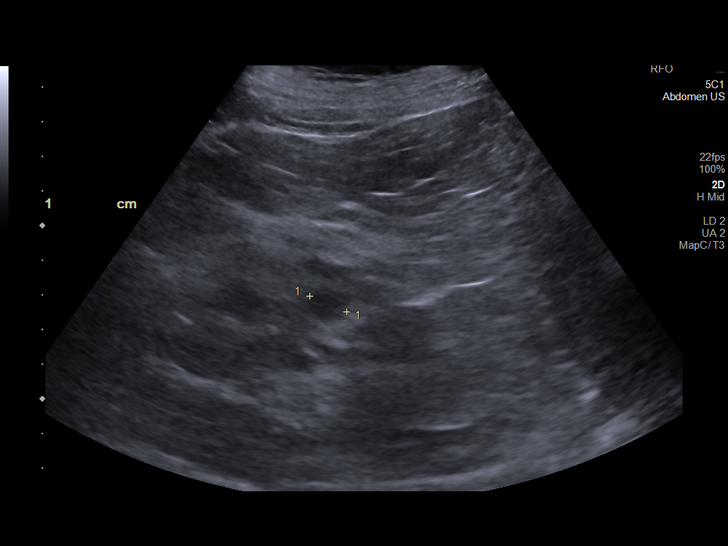
[im 30/48]
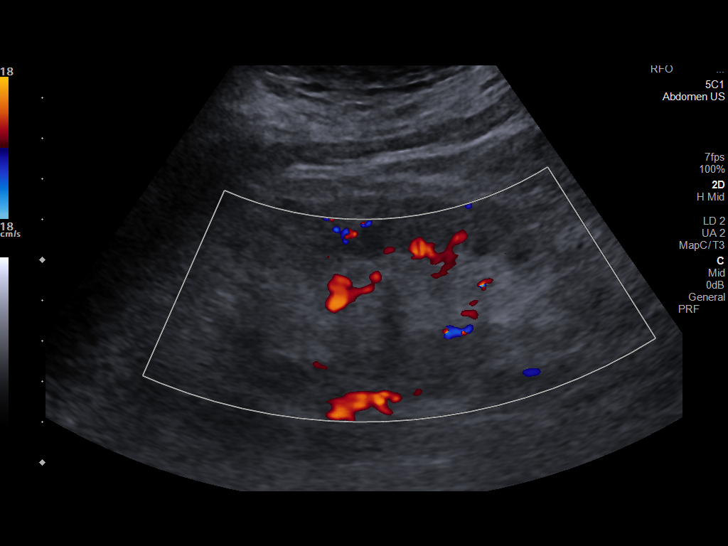
[im 32/48]
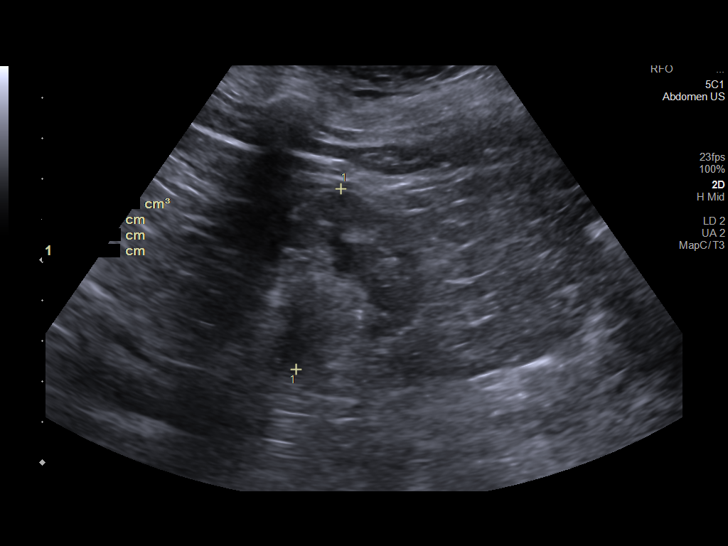
[im 36/48]
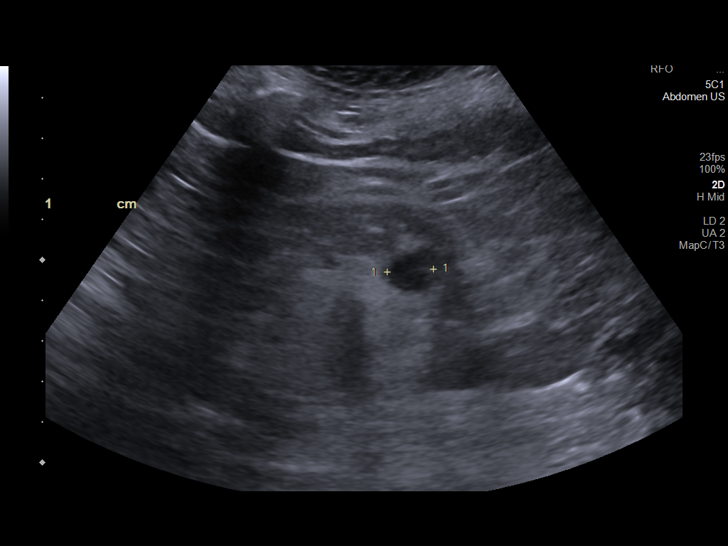
[im 40/48]
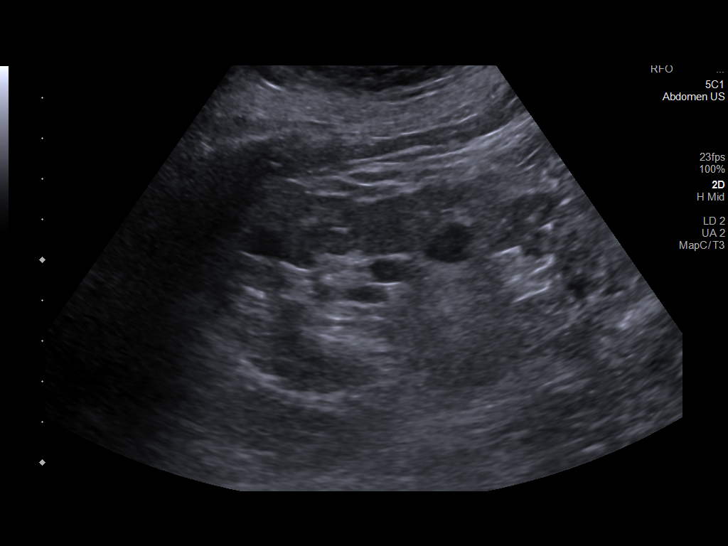
[im 44/48]
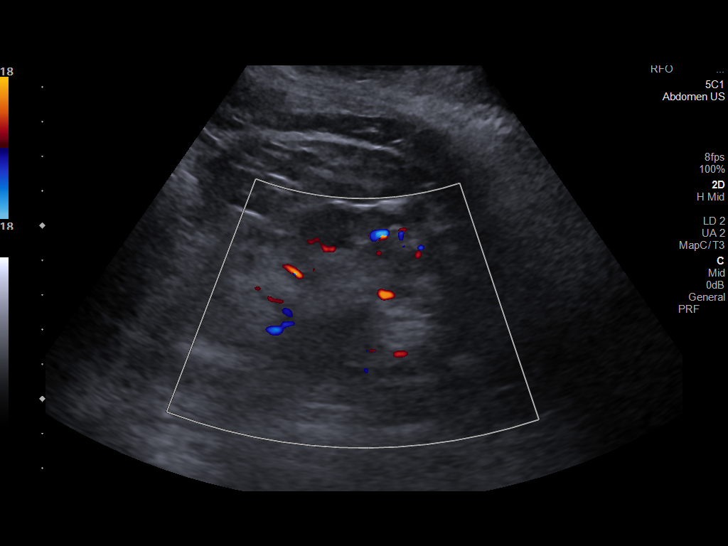
[im 48/48]
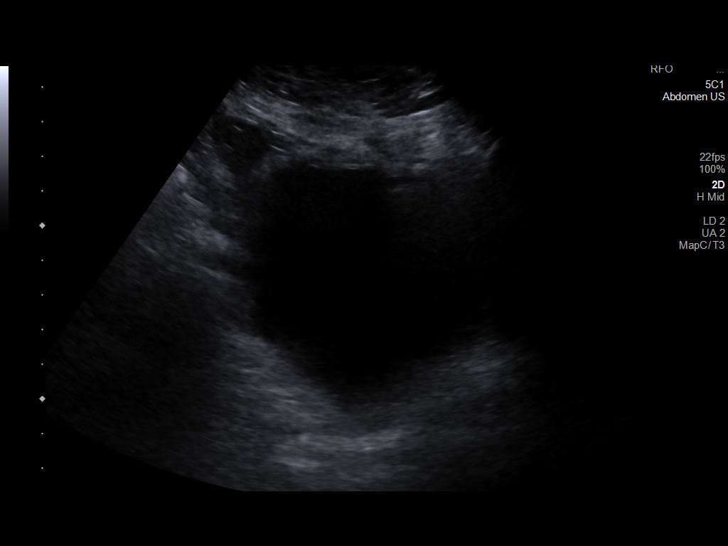

[14 of 25 positions shown; findings below may reference images not displayed]

FINDINGS: Right Kidney:

Renal measurements: 8.4 x 4.8 x 3.7 cm = volume: 78.2 mL. Diffuse
increased echotexture of the kidney is identified. No hydronephrosis
visualized. Less than 10 simple cysts are identified within the
right kidney. The largest cyst measures 1.5 x 1.3 x 1.3 cm in the
lower pole.

Left Kidney:

Renal measurements: 9.8 x 5.4 x 4.6 cm = volume: 126 mL. Diffuse
increased echotexture of the kidney is noted. No hydronephrosis
visualized. Less than 10 simple cysts are identified within the left
kidney. The largest cyst measures 1.5 x 1.1 x 1.1 cm in the upper
pole.

Bladder:

Appears normal for degree of bladder distention.

Other:

None.
IMPRESSION: No significant change in renal ultrasound compared to prior exam.
Increased echotexture of the renal parenchyma likely secondary to
medical renal disease.

Multiple bilateral simple renal cysts.

## 2023-11-25 ENCOUNTER — Other Ambulatory Visit: Payer: Self-pay

## 2023-11-25 ENCOUNTER — Telehealth: Payer: Self-pay | Admitting: Urology

## 2023-11-25 DIAGNOSIS — N390 Urinary tract infection, site not specified: Secondary | ICD-10-CM

## 2023-11-25 NOTE — Telephone Encounter (Signed)
 Urologic History:  Any Recent Urologic Surgeries or Procedures:no Recurrent UTI's:yes Cystitis: no  Prostatitis:no Kidney or Bladder Stones: no Plan: Walk-in Clinic: no Appointment w/Physician: [no Lab visit scheduled for urine drop off: Yes  Do you take on daily medications for UTI suppression No    Son unable to get here today before closing and will drop off urine tomorrow morning.

## 2023-11-25 NOTE — Telephone Encounter (Signed)
 Dysuria  Patient called with c/o dysuria x 1 week days.  Pain: burning  Severity:8/10  Associated Signs and Symptoms:  Fever: noTemp. Chills: no Hematuria: no Urgency: yes Frequency: yes Hesitancy:no Incontinence: no Nausea: no Vomiting: no  Message sent to clinic staff to return call to patient to advise of plan.

## 2023-11-25 NOTE — Addendum Note (Signed)
 Addended by: JAMA ALAN POUR on: 11/25/2023 04:25 PM   Modules accepted: Orders

## 2023-11-26 ENCOUNTER — Other Ambulatory Visit: Payer: Self-pay

## 2023-11-26 ENCOUNTER — Other Ambulatory Visit

## 2023-11-26 ENCOUNTER — Telehealth: Payer: Self-pay

## 2023-11-26 DIAGNOSIS — N390 Urinary tract infection, site not specified: Secondary | ICD-10-CM

## 2023-11-26 LAB — URINALYSIS, ROUTINE W REFLEX MICROSCOPIC
Bilirubin, UA: NEGATIVE
Glucose, UA: NEGATIVE
Ketones, UA: NEGATIVE
Nitrite, UA: NEGATIVE
Protein,UA: NEGATIVE
RBC, UA: NEGATIVE
Specific Gravity, UA: 1.01 (ref 1.005–1.030)
Urobilinogen, Ur: 0.2 mg/dL (ref 0.2–1.0)
pH, UA: 5.5 (ref 5.0–7.5)

## 2023-11-26 LAB — MICROSCOPIC EXAMINATION: WBC, UA: >30 /HPF — AB (ref 0–5)

## 2023-11-26 MED ORDER — NITROFURANTOIN MONOHYD MACRO 100 MG PO CAPS: 100.0000 mg | ORAL_CAPSULE | Freq: Two times a day (BID) | ORAL | 0 refills | Status: DC

## 2023-11-26 NOTE — Telephone Encounter (Signed)
 Called pt and lvm to let her know that her ua came back positive for infections and a prescription was sent to her local pharmacy

## 2023-11-29 LAB — URINE CULTURE

## 2023-12-02 ENCOUNTER — Ambulatory Visit: Payer: Self-pay

## 2023-12-02 NOTE — Telephone Encounter (Signed)
 FYI Only or Action Required?: FYI only for provider.  Patient was last seen in primary care on 10/08/2023 by [place], [place], [place].  Called Nurse Triage reporting Diarrhea.  Symptoms began 2-3 days ago.  Interventions attempted: Rest, hydration, or home remedies.  Symptoms are: stable.  Triage Disposition: Home Care  Patient/caregiver understands and will follow disposition?: Yes  **Home care advice given; patient son will call back if symptoms change/worsen**            Message from Orwell T sent at 12/02/2023 11:08 AM EDT  Son [name] calling for patient- she is being treated for UTI, now has diarrhea for the past 2-3 days and it is getting worse, tremors and weakness getting worse, concerned about pulse ox last night- please call [phone]   Reason for Disposition  [1] MILD diarrhea AND [2] taking antibiotics  Answer Assessment - Initial Assessment Questions 1. ANTIBIOTIC: What antibiotic are you taking? How many times per day?     Patient is currently being treat for UTI by Urology Nitrofurantoine; medication began on 9/10 x 7 day course; 2 days left   2. ANTIBIOTIC ONSET: When was the antibiotic started?     9/10   3. DIARRHEA SEVERITY: How bad is the diarrhea? How many more stools have you had in the past 24 hours than normal?       X 3    4. ONSET: When did the diarrhea begin?      X 2-3 days ago   5. BM CONSISTENCY: How loose or watery is the diarrhea?      Loose   6. VOMITING: Are you also vomiting? If Yes, ask: How many times in the past 24 hours?      No   7. ABDOMEN PAIN: Are you having any abdomen pain? If Yes, ask: What does it feel like? (e.g., crampy, dull, intermittent, constant)      Crampy   8. ABDOMEN PAIN SEVERITY: If present, ask: How bad is the pain?  (e.g., Scale 1-10; mild, moderate, or severe)     None   9. ORAL INTAKE: If vomiting, Have you been able to drink liquids? How much liquids have you had  in the past 24 hours?     No vomiting   10. HYDRATION: Any signs of dehydration? (e.g., dry mouth [not just dry lips], too weak to stand, dizziness, new weight loss) When did you last urinate?      No    12. OTHER SYMPTOMS: Do you have any other symptoms? (e.g., fever, blood in stool) No   Son also stated pulse -ox last last was in upper 80's, he switched fingers, 02 was normal. Home care advice given; son agrees with plan of care and will call back if symptoms change/worsen**  Protocols used: Diarrhea on Antibiotics-A-AH

## 2023-12-08 DIAGNOSIS — D631 Anemia in chronic kidney disease: Secondary | ICD-10-CM | POA: Diagnosis not present

## 2023-12-08 DIAGNOSIS — R809 Proteinuria, unspecified: Secondary | ICD-10-CM | POA: Diagnosis not present

## 2023-12-08 DIAGNOSIS — N189 Chronic kidney disease, unspecified: Secondary | ICD-10-CM | POA: Diagnosis not present

## 2023-12-09 LAB — LAB REPORT - SCANNED: Creatinine, POC: 35.4 mg/dL

## 2023-12-14 IMAGING — US US RENAL
1 series · 14 of 25 positions shown · non-contrast
Comparison: 07/02/2021

CLINICAL DATA: Acute renal failure with tubular necrosis

Type 2 diabetes
EXAM:
RENAL / URINARY TRACT ULTRASOUND COMPLETE

[Series 1: us renal · 14 of 60 slices shown]
[im 1/60]
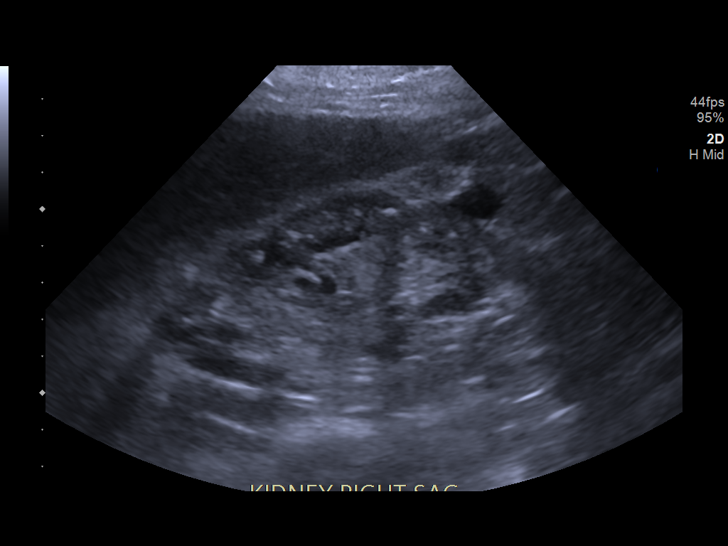
[im 5/60]
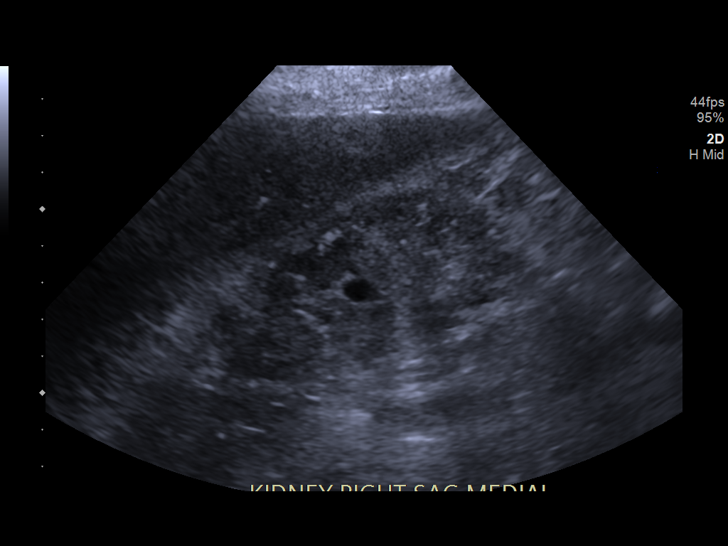
[im 10/60]
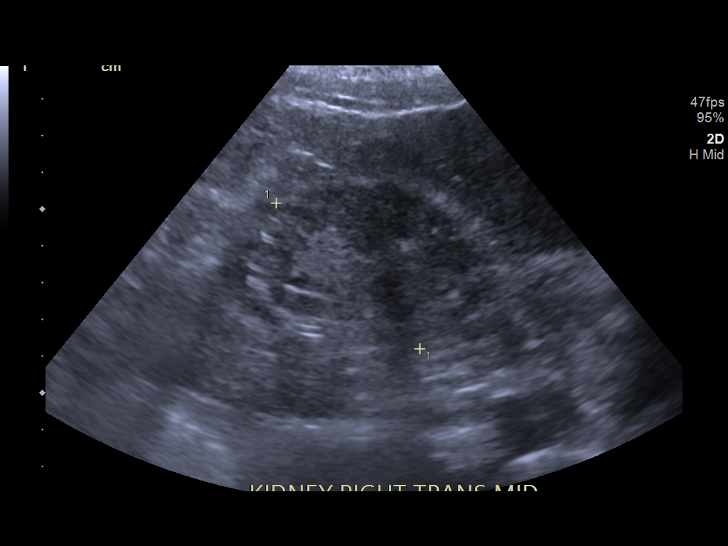
[im 15/60]
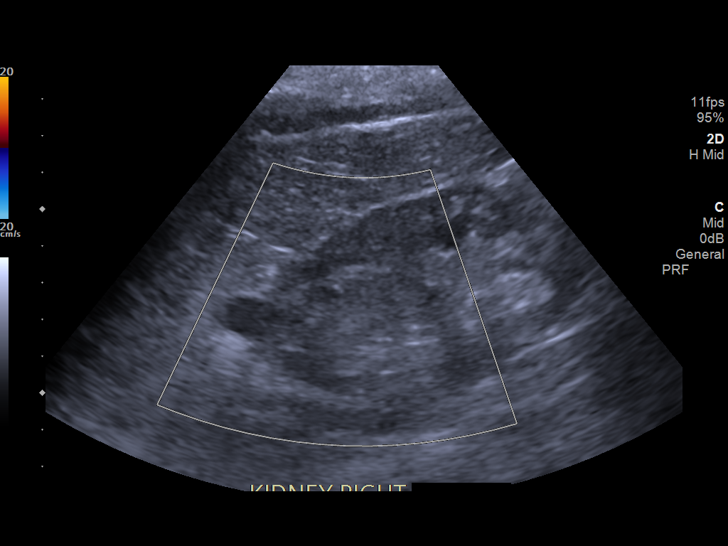
[im 20/60]
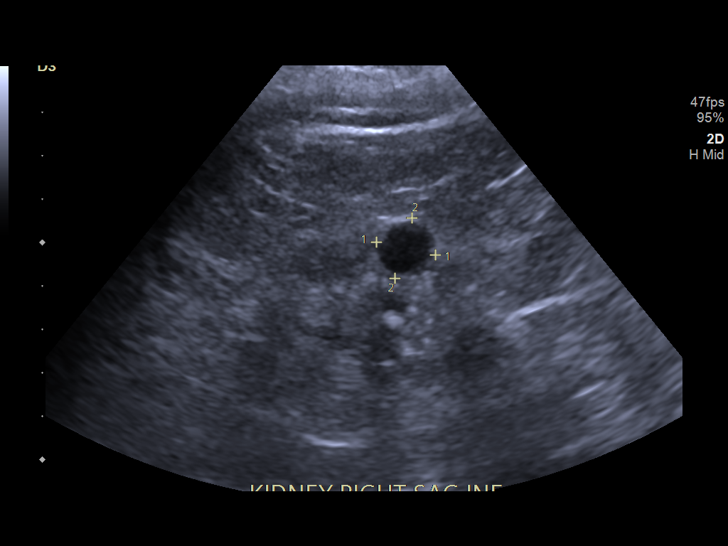
[im 23/60]
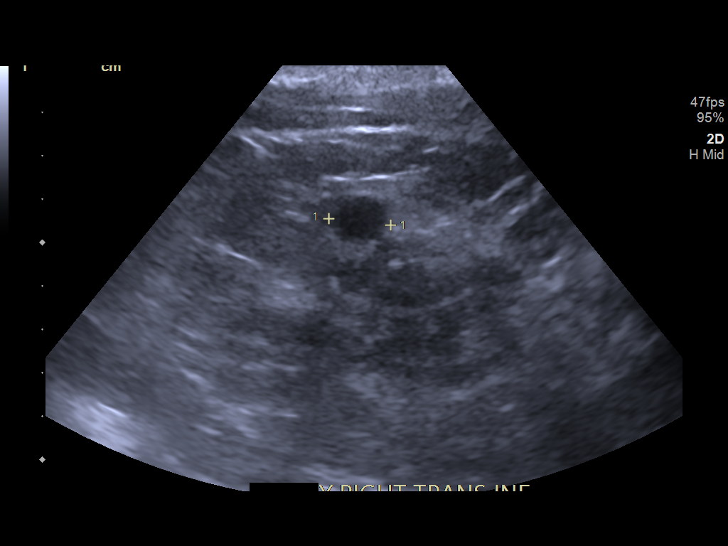
[im 28/60]
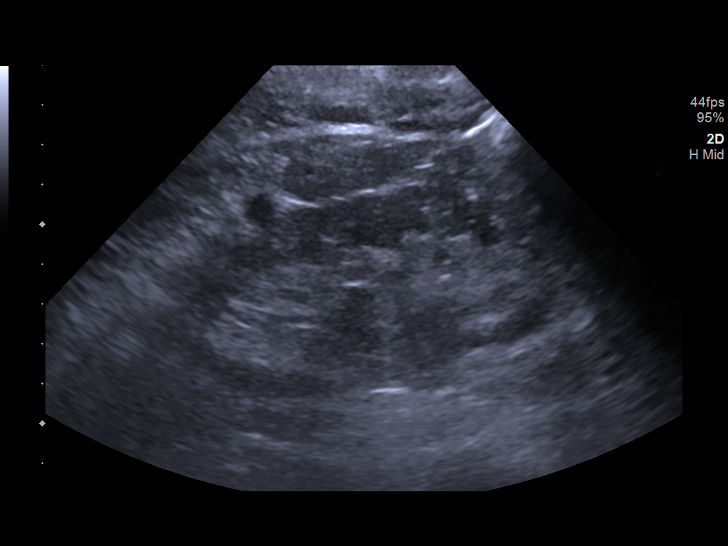
[im 32/60]
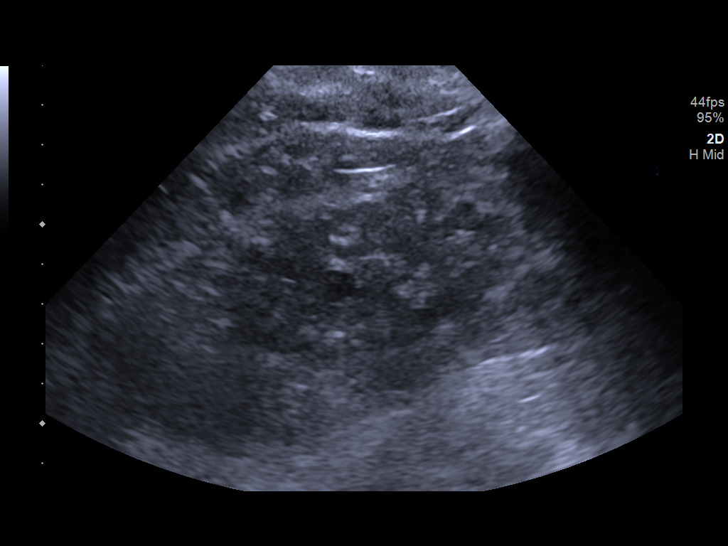
[im 37/60]
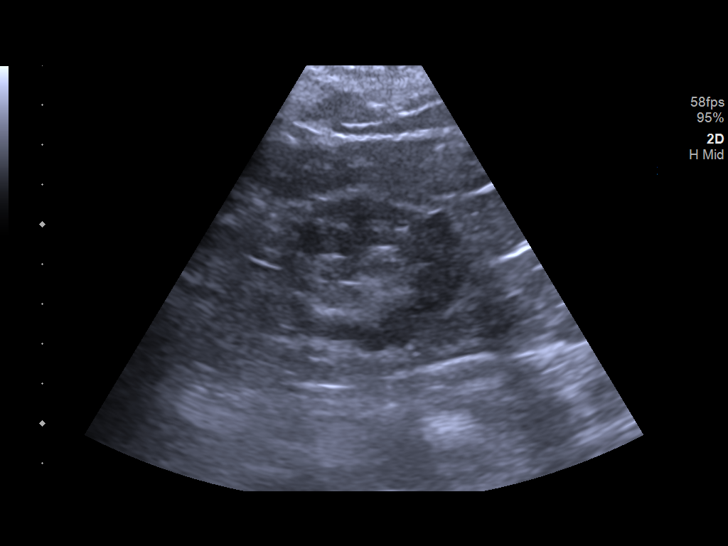
[im 40/60]
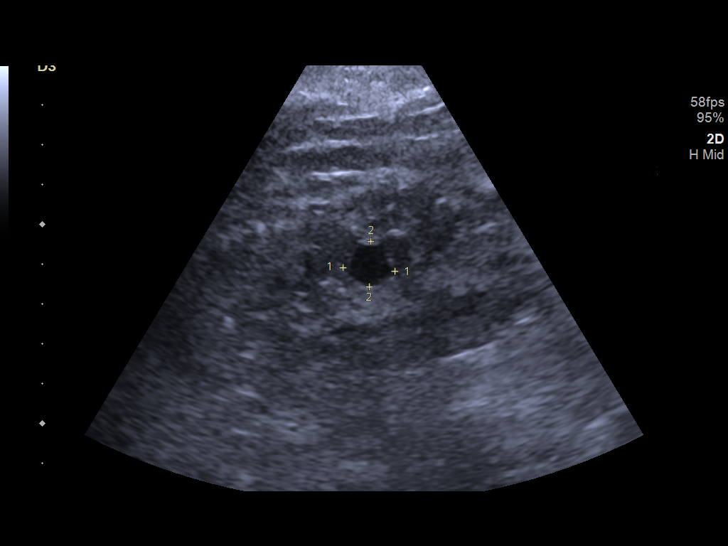
[im 45/60]
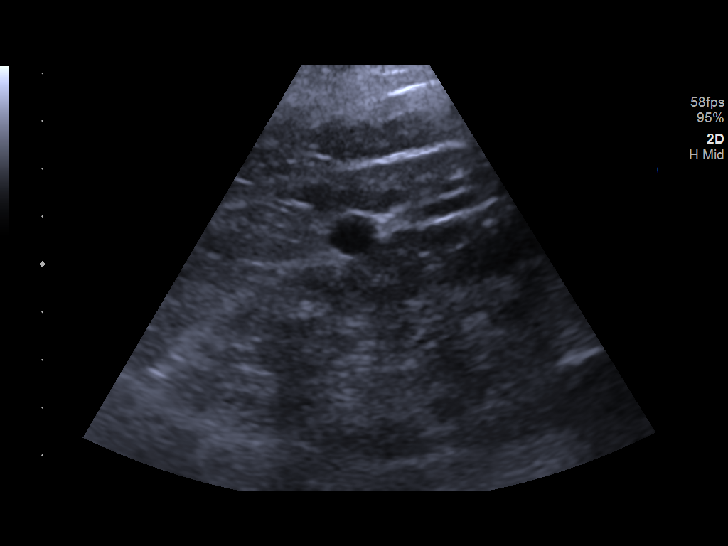
[im 50/60]
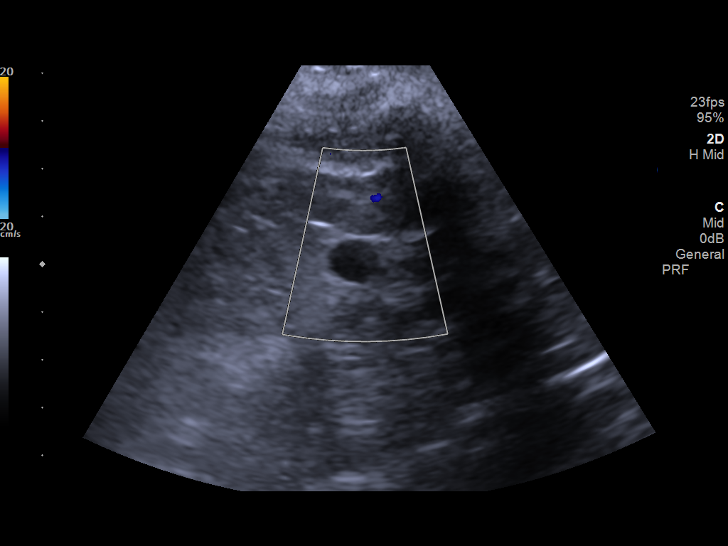
[im 55/60]
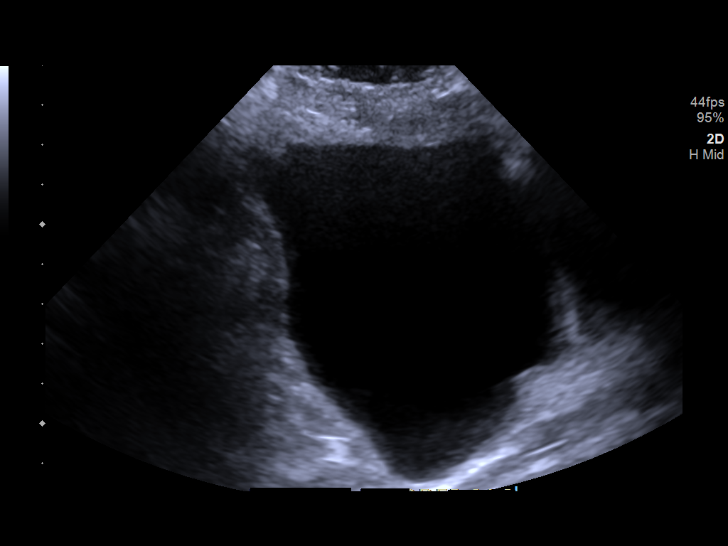
[im 60/60]
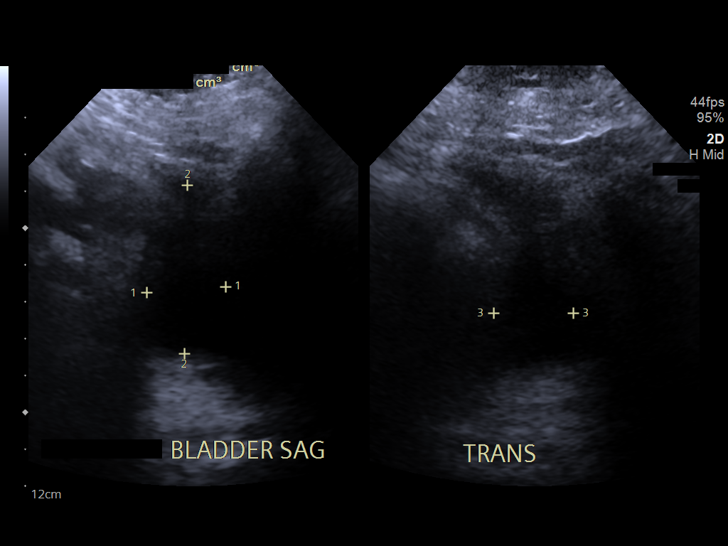

[14 of 25 positions shown; findings below may reference images not displayed]

FINDINGS: Right Kidney:

Renal measurements: 10.5 x 4.7 x 5.6 cm = volume: 142 mL. Diffusely
thin echogenic cortex. No hydronephrosis. Multiple simple cysts are
seen measuring up to 2 cm.

Left Kidney:

Renal measurements: 10.1 x 4.9 x 4.9 cm = volume: 127 mL. Diffusely
thinned echogenic cortex. Multiple simple cysts are present
measuring up to 1.3 cm.

Bladder:

Appears normal for degree of bladder distention.

Other:

None.
IMPRESSION: Thinned echogenic renal cortices consistent with chronic medical
renal disease.

## 2023-12-15 DIAGNOSIS — I129 Hypertensive chronic kidney disease with stage 1 through stage 4 chronic kidney disease, or unspecified chronic kidney disease: Secondary | ICD-10-CM | POA: Diagnosis not present

## 2023-12-15 DIAGNOSIS — N184 Chronic kidney disease, stage 4 (severe): Secondary | ICD-10-CM | POA: Diagnosis not present

## 2023-12-15 DIAGNOSIS — E1129 Type 2 diabetes mellitus with other diabetic kidney complication: Secondary | ICD-10-CM | POA: Diagnosis not present

## 2023-12-15 DIAGNOSIS — R809 Proteinuria, unspecified: Secondary | ICD-10-CM | POA: Diagnosis not present

## 2023-12-16 DIAGNOSIS — M79672 Pain in left foot: Secondary | ICD-10-CM | POA: Diagnosis not present

## 2023-12-16 DIAGNOSIS — M79671 Pain in right foot: Secondary | ICD-10-CM | POA: Diagnosis not present

## 2023-12-16 DIAGNOSIS — I739 Peripheral vascular disease, unspecified: Secondary | ICD-10-CM | POA: Diagnosis not present

## 2023-12-16 DIAGNOSIS — E1140 Type 2 diabetes mellitus with diabetic neuropathy, unspecified: Secondary | ICD-10-CM | POA: Diagnosis not present

## 2023-12-17 ENCOUNTER — Ambulatory Visit: Admitting: Endocrinology

## 2023-12-17 DIAGNOSIS — R531 Weakness: Secondary | ICD-10-CM | POA: Diagnosis not present

## 2023-12-18 ENCOUNTER — Encounter: Payer: Self-pay | Admitting: Family Medicine

## 2023-12-18 ENCOUNTER — Ambulatory Visit: Admitting: Family Medicine

## 2023-12-18 VITALS — BP 139/71 | HR 76 | Ht 60.0 in | Wt 203.0 lb

## 2023-12-18 DIAGNOSIS — I10 Essential (primary) hypertension: Secondary | ICD-10-CM | POA: Diagnosis not present

## 2023-12-18 DIAGNOSIS — Z8673 Personal history of transient ischemic attack (TIA), and cerebral infarction without residual deficits: Secondary | ICD-10-CM

## 2023-12-18 DIAGNOSIS — R251 Tremor, unspecified: Secondary | ICD-10-CM | POA: Diagnosis not present

## 2023-12-18 DIAGNOSIS — R296 Repeated falls: Secondary | ICD-10-CM | POA: Diagnosis not present

## 2023-12-18 NOTE — Patient Instructions (Addendum)
 Stop Gabapentin .  Referrals placed.   Follow up in 1 month.

## 2023-12-21 DIAGNOSIS — R296 Repeated falls: Secondary | ICD-10-CM | POA: Insufficient documentation

## 2023-12-21 DIAGNOSIS — R251 Tremor, unspecified: Secondary | ICD-10-CM | POA: Insufficient documentation

## 2023-12-21 NOTE — Progress Notes (Signed)
 Subjective:  Patient ID: Amy Kelly, female    DOB: 1929-02-12  Age: 89 y.o. MRN: [id number]  CC:   Chief Complaint  Patient presents with   nurse aid    Requesting more help, needing a nurse aid   Fall    Falling more frequently    Tremors    Tremors in arms and legs    HPI:  89 year old female with the below mentioned medical problems presents with multiple issues/complaints.  She is accompanied by her son today.  Had a recent fall and is complaining of left elbow pain (occurred Tuesday). Son states she is having more frequent falls. She is becoming more and more dependent and needs assistance with ADLs at home.  She has also been experiencing tremor. Mostly of the upper extremities. Severe. Started 3-4 weeks ago. She states that she also has them of the lower extremities as well. Son concerned about Gabapentin  contributing.   Patient Active Problem List   Diagnosis Date Noted   Tremor 12/21/2023   Frequent falls 12/21/2023   Right arm pain 10/08/2023   Lower extremity edema 08/24/2023   History of breast cancer 08/24/2023   Sensorineural hearing loss, bilateral 02/12/2023   Neuropathy 12/18/2022   Physical deconditioning 12/18/2022   Uncontrolled type 2 diabetes mellitus with hyperglycemia (HCC) 07/07/2022   RLS (restless legs syndrome) 07/07/2022   Chronic diastolic heart failure (HCC) 07/03/2022   Constipation 02/24/2022   Urge incontinence 09/10/2021   CKD (chronic kidney disease), stage IV (HCC) 11/09/2020   HTN (hypertension) 11/08/2020   Hypothyroidism 11/08/2020   Anxiety 11/08/2020   Stroke (HCC) 11/08/2020    Social Hx   Social History   Socioeconomic History   Marital status: Widowed    Spouse name: Not on file   Number of children: Not on file   Years of education: Not on file   Highest education level: 7th grade  Occupational History   Not on file  Tobacco Use   Smoking status: Never    Passive exposure: Never   Smokeless tobacco: Never   Vaping Use   Vaping status: Never Used  Substance and Sexual Activity   Alcohol use: Never   Drug use: Never   Sexual activity: Not Currently    Birth control/protection: None  Other Topics Concern   Not on file  Social History Narrative   Right handed   Social Drivers of Health   Financial Resource Strain: Low Risk  (09/04/2023)   Overall Financial Resource Strain (CARDIA)    Difficulty of Paying Living Expenses: Not very hard  Food Insecurity: No Food Insecurity (09/04/2023)   Hunger Vital Sign    Worried About Running Out of Food in the Last Year: Never true    Ran Out of Food in the Last Year: Never true  Transportation Needs: No Transportation Needs (09/04/2023)   PRAPARE - Administrator, Civil Service (Medical): No    Lack of Transportation (Non-Medical): No  Physical Activity: Inactive (09/04/2023)   Exercise Vital Sign    Days of Exercise per Week: 0 days    Minutes of Exercise per Session: 0 min  Stress: Stress Concern Present (09/04/2023)   [employer] of Occupational Health - Occupational Stress Questionnaire    Feeling of Stress: To some extent  Social Connections: Socially Isolated (09/04/2023)   Social Connection and Isolation Panel    Frequency of Communication with Friends and Family: Three times a week    Frequency of Social  Gatherings with Friends and Family: Never    Attends Religious Services: Never    Database administrator or Organizations: No    Attends Banker Meetings: Never    Marital Status: Widowed    Review of Systems Per HPI  Objective:  BP 139/71   Pulse 76   Ht 5' (1.524 m)   Wt 203 lb (92.1 kg)   SpO2 94%   BMI 39.65 kg/m      12/18/2023    2:22 PM 10/15/2023    1:42 PM 10/08/2023   10:37 AM  BP/Weight  Systolic BP 139 122 127  Diastolic BP 71 56 69  Wt. (Lbs) 203  205  BMI 39.65 kg/m2  37.49 kg/m2    Physical Exam Vitals and nursing note reviewed.  Constitutional:      General: She is not in  acute distress.    Appearance: She is obese.  HENT:     Head: Normocephalic and atraumatic.  Cardiovascular:     Rate and Rhythm: Normal rate and regular rhythm.  Pulmonary:     Effort: Pulmonary effort is normal.     Breath sounds: Normal breath sounds.  Neurological:     Mental Status: She is alert.     Comments: Tremor of the upper extremity noted.     Lab Results  Component Value Date   WBC 9.4 03/20/2023   HGB 11.8 03/20/2023   HCT 36.6 03/20/2023   PLT 184 03/20/2023   GLUCOSE 191 (H) 08/13/2023   CHOL 120 03/20/2023   TRIG 156 (H) 03/20/2023   HDL 42 03/20/2023   LDLCALC 51 03/20/2023   ALT 18 03/20/2023   AST 18 03/20/2023   NA 140 08/13/2023   K 4.9 08/13/2023   CL 99 08/13/2023   CREATININE 2.26 (H) 08/13/2023   BUN 51 (H) 08/13/2023   CO2 24 08/13/2023   TSH 5.410 (H) 03/20/2023   HGBA1C 7.2 (A) 06/24/2023     Assessment & Plan:  Tremor Assessment & Plan: New problem. Stopping Gabapentin . Referring to neurology.  Orders: -     Ambulatory referral to Neurology -     Ambulatory referral to Home Health  History of stroke -     Ambulatory referral to Social Work -     Ambulatory referral to Home Health  Primary hypertension Assessment & Plan: Stable.   Frequent falls Assessment & Plan: Referring to social work. Referring from home health PT, OT, Nursing, Aide. Exam of the left elbow was normal. No need for imaging.     Follow-up:  1 month  Lejla Moeser DO [facility]

## 2023-12-21 NOTE — Assessment & Plan Note (Signed)
 Stable

## 2023-12-21 NOTE — Assessment & Plan Note (Signed)
 New problem. Stopping Gabapentin . Referring to neurology.

## 2023-12-21 NOTE — Assessment & Plan Note (Signed)
 Referring to social work. Referring from home health PT, OT, Nursing, Aide. Exam of the left elbow was normal. No need for imaging.

## 2023-12-22 ENCOUNTER — Telehealth: Payer: Self-pay

## 2023-12-22 NOTE — Progress Notes (Unsigned)
 Complex Care Management Note Care Guide Note  12/22/2023 Name: Amy Kelly MRN: [id number] DOB: Jul 20, 1928   Complex Care Management Outreach Attempts: An unsuccessful telephone outreach was attempted today to offer the patient information about available complex care management services.  Follow Up Plan:  Additional outreach attempts will be made to offer the patient complex care management information and services.   Encounter Outcome:  No Answer  Jeoffrey Buffalo , RMA     [place]  [facility], [facility] Guide  Direct Dial : [phone]  Website: [email or web address]

## 2023-12-23 NOTE — Progress Notes (Signed)
 Complex Care Management Note  Care Guide Note 12/23/2023 Name: Xylia Scherger MRN: [id number] DOB: 07-19-1928  Zaryia Markel is a 89 y.o. year old female who sees Cook, Jayce G, DO for primary care. I reached out to Alfonse Jerry by phone today to offer complex care management services.  Ms. Laker was given information about Complex Care Management services today including:   The Complex Care Management services include support from the care team which includes your Nurse Care Manager, Clinical Social Worker, or Pharmacist.  The Complex Care Management team is here to help remove barriers to the health concerns and goals most important to you. Complex Care Management services are voluntary, and the patient may decline or stop services at any time by request to their care team member.   Complex Care Management Consent Status: Patient agreed to services and verbal consent obtained.   Follow up plan:  Telephone appointment with complex care management team member scheduled for:  01/01/2024  Encounter Outcome:  Patient Scheduled  Jeoffrey Buffalo , RMA     [place]  [facility], [facility] Guide  Direct Dial : [phone]  Website: [email or web address]

## 2023-12-24 DIAGNOSIS — N184 Chronic kidney disease, stage 4 (severe): Secondary | ICD-10-CM | POA: Diagnosis not present

## 2023-12-24 DIAGNOSIS — F419 Anxiety disorder, unspecified: Secondary | ICD-10-CM | POA: Diagnosis not present

## 2023-12-24 DIAGNOSIS — E1122 Type 2 diabetes mellitus with diabetic chronic kidney disease: Secondary | ICD-10-CM | POA: Diagnosis not present

## 2023-12-24 DIAGNOSIS — E039 Hypothyroidism, unspecified: Secondary | ICD-10-CM | POA: Diagnosis not present

## 2023-12-24 DIAGNOSIS — R296 Repeated falls: Secondary | ICD-10-CM | POA: Diagnosis not present

## 2023-12-24 DIAGNOSIS — Z853 Personal history of malignant neoplasm of breast: Secondary | ICD-10-CM | POA: Diagnosis not present

## 2023-12-24 DIAGNOSIS — Z9181 History of falling: Secondary | ICD-10-CM | POA: Diagnosis not present

## 2023-12-24 DIAGNOSIS — Z794 Long term (current) use of insulin: Secondary | ICD-10-CM | POA: Diagnosis not present

## 2023-12-24 DIAGNOSIS — Z556 Problems related to health literacy: Secondary | ICD-10-CM | POA: Diagnosis not present

## 2023-12-24 DIAGNOSIS — E1165 Type 2 diabetes mellitus with hyperglycemia: Secondary | ICD-10-CM | POA: Diagnosis not present

## 2023-12-24 DIAGNOSIS — Z7902 Long term (current) use of antithrombotics/antiplatelets: Secondary | ICD-10-CM | POA: Diagnosis not present

## 2023-12-24 DIAGNOSIS — E1140 Type 2 diabetes mellitus with diabetic neuropathy, unspecified: Secondary | ICD-10-CM | POA: Diagnosis not present

## 2023-12-24 DIAGNOSIS — Z7984 Long term (current) use of oral hypoglycemic drugs: Secondary | ICD-10-CM | POA: Diagnosis not present

## 2023-12-24 DIAGNOSIS — G2581 Restless legs syndrome: Secondary | ICD-10-CM | POA: Diagnosis not present

## 2023-12-24 DIAGNOSIS — I69354 Hemiplegia and hemiparesis following cerebral infarction affecting left non-dominant side: Secondary | ICD-10-CM | POA: Diagnosis not present

## 2023-12-24 DIAGNOSIS — I5032 Chronic diastolic (congestive) heart failure: Secondary | ICD-10-CM | POA: Diagnosis not present

## 2023-12-24 DIAGNOSIS — I130 Hypertensive heart and chronic kidney disease with heart failure and stage 1 through stage 4 chronic kidney disease, or unspecified chronic kidney disease: Secondary | ICD-10-CM | POA: Diagnosis not present

## 2023-12-24 DIAGNOSIS — N3941 Urge incontinence: Secondary | ICD-10-CM | POA: Diagnosis not present

## 2023-12-24 DIAGNOSIS — M25522 Pain in left elbow: Secondary | ICD-10-CM | POA: Diagnosis not present

## 2023-12-24 DIAGNOSIS — H905 Unspecified sensorineural hearing loss: Secondary | ICD-10-CM | POA: Diagnosis not present

## 2023-12-25 ENCOUNTER — Encounter: Payer: Self-pay | Admitting: Nurse Practitioner

## 2023-12-26 DIAGNOSIS — E1122 Type 2 diabetes mellitus with diabetic chronic kidney disease: Secondary | ICD-10-CM | POA: Diagnosis not present

## 2023-12-26 DIAGNOSIS — N3941 Urge incontinence: Secondary | ICD-10-CM | POA: Diagnosis not present

## 2023-12-26 DIAGNOSIS — I69354 Hemiplegia and hemiparesis following cerebral infarction affecting left non-dominant side: Secondary | ICD-10-CM | POA: Diagnosis not present

## 2023-12-26 DIAGNOSIS — Z794 Long term (current) use of insulin: Secondary | ICD-10-CM | POA: Diagnosis not present

## 2023-12-26 DIAGNOSIS — E1140 Type 2 diabetes mellitus with diabetic neuropathy, unspecified: Secondary | ICD-10-CM | POA: Diagnosis not present

## 2023-12-26 DIAGNOSIS — H905 Unspecified sensorineural hearing loss: Secondary | ICD-10-CM | POA: Diagnosis not present

## 2023-12-26 DIAGNOSIS — M25522 Pain in left elbow: Secondary | ICD-10-CM | POA: Diagnosis not present

## 2023-12-26 DIAGNOSIS — G2581 Restless legs syndrome: Secondary | ICD-10-CM | POA: Diagnosis not present

## 2023-12-26 DIAGNOSIS — E039 Hypothyroidism, unspecified: Secondary | ICD-10-CM | POA: Diagnosis not present

## 2023-12-26 DIAGNOSIS — Z556 Problems related to health literacy: Secondary | ICD-10-CM | POA: Diagnosis not present

## 2023-12-26 DIAGNOSIS — N184 Chronic kidney disease, stage 4 (severe): Secondary | ICD-10-CM | POA: Diagnosis not present

## 2023-12-26 DIAGNOSIS — E1165 Type 2 diabetes mellitus with hyperglycemia: Secondary | ICD-10-CM | POA: Diagnosis not present

## 2023-12-26 DIAGNOSIS — R296 Repeated falls: Secondary | ICD-10-CM | POA: Diagnosis not present

## 2023-12-26 DIAGNOSIS — Z7902 Long term (current) use of antithrombotics/antiplatelets: Secondary | ICD-10-CM | POA: Diagnosis not present

## 2023-12-26 DIAGNOSIS — Z853 Personal history of malignant neoplasm of breast: Secondary | ICD-10-CM | POA: Diagnosis not present

## 2023-12-26 DIAGNOSIS — Z7984 Long term (current) use of oral hypoglycemic drugs: Secondary | ICD-10-CM | POA: Diagnosis not present

## 2023-12-26 DIAGNOSIS — F419 Anxiety disorder, unspecified: Secondary | ICD-10-CM | POA: Diagnosis not present

## 2023-12-26 DIAGNOSIS — Z9181 History of falling: Secondary | ICD-10-CM | POA: Diagnosis not present

## 2023-12-26 DIAGNOSIS — I5032 Chronic diastolic (congestive) heart failure: Secondary | ICD-10-CM | POA: Diagnosis not present

## 2023-12-26 DIAGNOSIS — I130 Hypertensive heart and chronic kidney disease with heart failure and stage 1 through stage 4 chronic kidney disease, or unspecified chronic kidney disease: Secondary | ICD-10-CM | POA: Diagnosis not present

## 2023-12-29 DIAGNOSIS — Z7984 Long term (current) use of oral hypoglycemic drugs: Secondary | ICD-10-CM | POA: Diagnosis not present

## 2023-12-29 DIAGNOSIS — I130 Hypertensive heart and chronic kidney disease with heart failure and stage 1 through stage 4 chronic kidney disease, or unspecified chronic kidney disease: Secondary | ICD-10-CM | POA: Diagnosis not present

## 2023-12-29 DIAGNOSIS — G2581 Restless legs syndrome: Secondary | ICD-10-CM | POA: Diagnosis not present

## 2023-12-29 DIAGNOSIS — I5032 Chronic diastolic (congestive) heart failure: Secondary | ICD-10-CM | POA: Diagnosis not present

## 2023-12-29 DIAGNOSIS — E1165 Type 2 diabetes mellitus with hyperglycemia: Secondary | ICD-10-CM | POA: Diagnosis not present

## 2023-12-29 DIAGNOSIS — N3941 Urge incontinence: Secondary | ICD-10-CM | POA: Diagnosis not present

## 2023-12-29 DIAGNOSIS — Z7902 Long term (current) use of antithrombotics/antiplatelets: Secondary | ICD-10-CM | POA: Diagnosis not present

## 2023-12-29 DIAGNOSIS — Z794 Long term (current) use of insulin: Secondary | ICD-10-CM | POA: Diagnosis not present

## 2023-12-29 DIAGNOSIS — E1122 Type 2 diabetes mellitus with diabetic chronic kidney disease: Secondary | ICD-10-CM | POA: Diagnosis not present

## 2023-12-29 DIAGNOSIS — F419 Anxiety disorder, unspecified: Secondary | ICD-10-CM | POA: Diagnosis not present

## 2023-12-29 DIAGNOSIS — Z9181 History of falling: Secondary | ICD-10-CM | POA: Diagnosis not present

## 2023-12-29 DIAGNOSIS — E1140 Type 2 diabetes mellitus with diabetic neuropathy, unspecified: Secondary | ICD-10-CM | POA: Diagnosis not present

## 2023-12-29 DIAGNOSIS — Z556 Problems related to health literacy: Secondary | ICD-10-CM | POA: Diagnosis not present

## 2023-12-29 DIAGNOSIS — R296 Repeated falls: Secondary | ICD-10-CM | POA: Diagnosis not present

## 2023-12-29 DIAGNOSIS — H905 Unspecified sensorineural hearing loss: Secondary | ICD-10-CM | POA: Diagnosis not present

## 2023-12-29 DIAGNOSIS — E039 Hypothyroidism, unspecified: Secondary | ICD-10-CM | POA: Diagnosis not present

## 2023-12-29 DIAGNOSIS — Z853 Personal history of malignant neoplasm of breast: Secondary | ICD-10-CM | POA: Diagnosis not present

## 2023-12-29 DIAGNOSIS — I69354 Hemiplegia and hemiparesis following cerebral infarction affecting left non-dominant side: Secondary | ICD-10-CM | POA: Diagnosis not present

## 2023-12-29 DIAGNOSIS — M25522 Pain in left elbow: Secondary | ICD-10-CM | POA: Diagnosis not present

## 2023-12-29 DIAGNOSIS — N184 Chronic kidney disease, stage 4 (severe): Secondary | ICD-10-CM | POA: Diagnosis not present

## 2023-12-30 DIAGNOSIS — I130 Hypertensive heart and chronic kidney disease with heart failure and stage 1 through stage 4 chronic kidney disease, or unspecified chronic kidney disease: Secondary | ICD-10-CM | POA: Diagnosis not present

## 2023-12-30 DIAGNOSIS — G2581 Restless legs syndrome: Secondary | ICD-10-CM | POA: Diagnosis not present

## 2023-12-30 DIAGNOSIS — Z9181 History of falling: Secondary | ICD-10-CM | POA: Diagnosis not present

## 2023-12-30 DIAGNOSIS — Z853 Personal history of malignant neoplasm of breast: Secondary | ICD-10-CM | POA: Diagnosis not present

## 2023-12-30 DIAGNOSIS — H905 Unspecified sensorineural hearing loss: Secondary | ICD-10-CM | POA: Diagnosis not present

## 2023-12-30 DIAGNOSIS — E039 Hypothyroidism, unspecified: Secondary | ICD-10-CM | POA: Diagnosis not present

## 2023-12-30 DIAGNOSIS — M25522 Pain in left elbow: Secondary | ICD-10-CM | POA: Diagnosis not present

## 2023-12-30 DIAGNOSIS — N3941 Urge incontinence: Secondary | ICD-10-CM | POA: Diagnosis not present

## 2023-12-30 DIAGNOSIS — F419 Anxiety disorder, unspecified: Secondary | ICD-10-CM | POA: Diagnosis not present

## 2023-12-30 DIAGNOSIS — Z7902 Long term (current) use of antithrombotics/antiplatelets: Secondary | ICD-10-CM | POA: Diagnosis not present

## 2023-12-30 DIAGNOSIS — R296 Repeated falls: Secondary | ICD-10-CM | POA: Diagnosis not present

## 2023-12-30 DIAGNOSIS — N184 Chronic kidney disease, stage 4 (severe): Secondary | ICD-10-CM | POA: Diagnosis not present

## 2023-12-30 DIAGNOSIS — E1140 Type 2 diabetes mellitus with diabetic neuropathy, unspecified: Secondary | ICD-10-CM | POA: Diagnosis not present

## 2023-12-30 DIAGNOSIS — Z7984 Long term (current) use of oral hypoglycemic drugs: Secondary | ICD-10-CM | POA: Diagnosis not present

## 2023-12-30 DIAGNOSIS — Z556 Problems related to health literacy: Secondary | ICD-10-CM | POA: Diagnosis not present

## 2023-12-30 DIAGNOSIS — Z794 Long term (current) use of insulin: Secondary | ICD-10-CM | POA: Diagnosis not present

## 2023-12-30 DIAGNOSIS — I5032 Chronic diastolic (congestive) heart failure: Secondary | ICD-10-CM | POA: Diagnosis not present

## 2023-12-30 DIAGNOSIS — E1165 Type 2 diabetes mellitus with hyperglycemia: Secondary | ICD-10-CM | POA: Diagnosis not present

## 2023-12-30 DIAGNOSIS — I69354 Hemiplegia and hemiparesis following cerebral infarction affecting left non-dominant side: Secondary | ICD-10-CM | POA: Diagnosis not present

## 2023-12-30 DIAGNOSIS — E1122 Type 2 diabetes mellitus with diabetic chronic kidney disease: Secondary | ICD-10-CM | POA: Diagnosis not present

## 2023-12-31 ENCOUNTER — Encounter (INDEPENDENT_AMBULATORY_CARE_PROVIDER_SITE_OTHER): Payer: Self-pay | Admitting: Gastroenterology

## 2024-01-01 ENCOUNTER — Ambulatory Visit: Payer: Self-pay

## 2024-01-01 ENCOUNTER — Other Ambulatory Visit: Payer: Self-pay | Admitting: Licensed Clinical Social Worker

## 2024-01-01 DIAGNOSIS — F419 Anxiety disorder, unspecified: Secondary | ICD-10-CM | POA: Diagnosis not present

## 2024-01-01 DIAGNOSIS — Z556 Problems related to health literacy: Secondary | ICD-10-CM | POA: Diagnosis not present

## 2024-01-01 DIAGNOSIS — Z9181 History of falling: Secondary | ICD-10-CM | POA: Diagnosis not present

## 2024-01-01 DIAGNOSIS — R296 Repeated falls: Secondary | ICD-10-CM | POA: Diagnosis not present

## 2024-01-01 DIAGNOSIS — N3941 Urge incontinence: Secondary | ICD-10-CM | POA: Diagnosis not present

## 2024-01-01 DIAGNOSIS — Z7902 Long term (current) use of antithrombotics/antiplatelets: Secondary | ICD-10-CM | POA: Diagnosis not present

## 2024-01-01 DIAGNOSIS — H905 Unspecified sensorineural hearing loss: Secondary | ICD-10-CM | POA: Diagnosis not present

## 2024-01-01 DIAGNOSIS — Z7984 Long term (current) use of oral hypoglycemic drugs: Secondary | ICD-10-CM | POA: Diagnosis not present

## 2024-01-01 DIAGNOSIS — N184 Chronic kidney disease, stage 4 (severe): Secondary | ICD-10-CM | POA: Diagnosis not present

## 2024-01-01 DIAGNOSIS — E1165 Type 2 diabetes mellitus with hyperglycemia: Secondary | ICD-10-CM | POA: Diagnosis not present

## 2024-01-01 DIAGNOSIS — M25522 Pain in left elbow: Secondary | ICD-10-CM | POA: Diagnosis not present

## 2024-01-01 DIAGNOSIS — E1122 Type 2 diabetes mellitus with diabetic chronic kidney disease: Secondary | ICD-10-CM | POA: Diagnosis not present

## 2024-01-01 DIAGNOSIS — Z794 Long term (current) use of insulin: Secondary | ICD-10-CM | POA: Diagnosis not present

## 2024-01-01 DIAGNOSIS — I69354 Hemiplegia and hemiparesis following cerebral infarction affecting left non-dominant side: Secondary | ICD-10-CM | POA: Diagnosis not present

## 2024-01-01 DIAGNOSIS — I130 Hypertensive heart and chronic kidney disease with heart failure and stage 1 through stage 4 chronic kidney disease, or unspecified chronic kidney disease: Secondary | ICD-10-CM | POA: Diagnosis not present

## 2024-01-01 DIAGNOSIS — G2581 Restless legs syndrome: Secondary | ICD-10-CM | POA: Diagnosis not present

## 2024-01-01 DIAGNOSIS — Z853 Personal history of malignant neoplasm of breast: Secondary | ICD-10-CM | POA: Diagnosis not present

## 2024-01-01 DIAGNOSIS — E1140 Type 2 diabetes mellitus with diabetic neuropathy, unspecified: Secondary | ICD-10-CM | POA: Diagnosis not present

## 2024-01-01 DIAGNOSIS — I5032 Chronic diastolic (congestive) heart failure: Secondary | ICD-10-CM | POA: Diagnosis not present

## 2024-01-01 DIAGNOSIS — E039 Hypothyroidism, unspecified: Secondary | ICD-10-CM | POA: Diagnosis not present

## 2024-01-01 NOTE — Patient Instructions (Signed)
 Visit Information  Thank you for taking time to visit with me today. Please don't hesitate to contact me if I can be of assistance to you before our next scheduled appointment.  Our next appointment is by telephone on 01/16/24 at 1:15 pm Please call the care guide team at [phone] if you need to cancel or reschedule your appointment.   Following is a copy of your care plan:   Goals Addressed             This Visit's Progress    LCSW VBCI Social Work Care Plan       Problems:   Cognitive Deficits, Disease Management support and education needs related to Anxiety with Panic Symptoms,, and Level of Care Concerns:Memory Deficits  CSW Clinical Goal(s):   Over the next 60 days the Patient will attend all scheduled medical appointments as evidenced by patient report and care team review of appointment completion in electronic MEDICAL RECORD NUMBERby VBCI team demonstrate a reduction in symptoms related to Caregiver Stress Depression: anxiety  explore community resource options for unmet needs related to Depression  , Food Insecurity , Social Connections, and Stress.  Interventions:  Level of Care Concerns in a patient with Anxiety, DMII, and HTN Current level of care: home, alone, however patient's son [name] lives in the same apartment complex and assist her daily with her needs (and medications.) Evaluation of patient's unmet needs in current living environment ADL's Assessed needs, level of care concerns, how currently meeting needs and barriers to care Discuss community support options (ADTS) Reviewed basic eligibility and provided education on Personal Care Service process, Solution-Focused Strategies employed: other options to improve quality of life and safety within the home  Facility  Assessed needs and reviewed facility placement process; as well as the different levels of care Emotional Support Provided Motivational Interviewing employed Family does not want LTC placement  at this time  Mental Health:  Evaluation of current treatment plan related to Anxiety with Panic Symptoms, and Caregiver Stress Active listening / Reflection utilized Consideration on in-home help encouraged : options discussed Financial risk analyst / information provided Depression screen reviewed Discussed referral for psychiatry: Family prefer pt to see neurology first before considering psychiatry even though this was strongly encouraged Emotional Support Provided Participation in support group encouraged : Caregiver Relief Support Groups PHQ2/PHQ9 completed Provided general psycho-education for mental health needs Quality of sleep assessed & Sleep Hygiene techniques promoted Reviewed mental health medications and discussed importance of compliance:   Suicidal Ideation/Homicidal Ideation assessed: No SI/HI, family made aware of 988 resource Referral placed for VBCI RNCM and BSW per family request.  Patient Goals/Self-Care Activities:  Increase coping skills, healthy habits, self-management skills, and stress reduction  Plan:   The care management team will reach out to the patient again over the next 30 days.         Please call the Suicide and Crisis Lifeline: 988 call the [place]  National Suicide Prevention Lifeline: 682-542-0850 or TTY: (416)525-9747 TTY 952-092-2534) to talk to a trained counselor call 1-800-273-TALK (toll free, 24 hour hotline) go to [facility] [address], [place] [phone]) call the [facility] Crisis Line: 712-737-6064 call 911 if you are experiencing a Mental Health or Behavioral Health Crisis or need someone to talk to.  Patient verbalizes understanding of instructions and care plan provided today and agrees to view in MyChart. Active MyChart status and patient understanding of how to access instructions and care plan via MyChart confirmed  with patient.     Lyle Rung, BSW, MSW,  LCSW Licensed Clinical Social Worker [employer]   [email or web address] Direct Dial : [phone]

## 2024-01-01 NOTE — Patient Outreach (Addendum)
 Complex Care Management   Visit Note  01/01/2024  Name:  Amy Kelly MRN: [id number] DOB: 1928-05-22  Situation: Referral received for Complex Care Management related to Mental/Behavioral Health diagnosis panic attacks/anxiety and level of care concerns. I obtained verbal consent from Patient.  Visit completed with Caregiver  on the phone  Background:   Past Medical History:  Diagnosis Date   Allergy    Anxiety and depression    Arthritis    At risk for falls    Cancer [facility])    Cataract    CHF (congestive heart failure) (HCC)    Chronic kidney disease    Coronary artery disease    Diabetes mellitus (HCC) 11/08/2020   Diabetes mellitus without complication (HCC)    GERD (gastroesophageal reflux disease)    Heart murmur    History of skin cancer    Right shin   HOH (hard of hearing)    Hyperlipidemia    Hypertension    Hypothyroidism    Neuromuscular disorder (HCC)    Osteoporosis    Oxygen deficiency    Stroke [facility])    Ulcer     Assessment: Patient Reported Symptoms:  Cognitive Cognitive Status: Unable to Assess Cognitive/Intellectual Conditions Management [RPT]: Not Assessed   Health Maintenance Behaviors: Annual physical exam, Stress management Healing Pattern: Average Health Facilitated by: Rest, Stress management  Neurological Neurological Review of Symptoms: Not assessed Neurological Management Strategies: Adequate rest, Routine screening Neurological Self-Management Outcome: 3 (uncertain) Neurological Comment: Upcoming Neurology Appointment is not until May of 2026  HEENT HEENT Symptoms Reported: No symptoms reported HEENT Management Strategies: Routine screening HEENT Self-Management Outcome: 4 (good)    Cardiovascular Cardiovascular Symptoms Reported: Fatigue Does patient have uncontrolled Hypertension?: Yes Cardiovascular Management Strategies: Adequate rest, Routine screening Cardiovascular Self-Management Outcome: 4 (good)  Respiratory Respiratory  Symptoms Reported: Shortness of breath Additional Respiratory Details: Panic Attacks Respiratory Management Strategies: Adequate rest Respiratory Self-Management Outcome: 3 (uncertain)  Endocrine Endocrine Symptoms Reported: Not assessed    Gastrointestinal Gastrointestinal Symptoms Reported: Not assessed      Genitourinary Genitourinary Symptoms Reported: Not assessed    Integumentary Integumentary Symptoms Reported: Not assessed    Musculoskeletal Musculoskelatal Symptoms Reviewed: Difficulty walking, Weakness, Unsteady gait Additional Musculoskeletal Details: PT OT recently started Musculoskeletal Management Strategies: Adequate rest, Coping strategies, Routine screening, Medication therapy Musculoskeletal Self-Management Outcome: 2 (bad)      Psychosocial Psychosocial Symptoms Reported: Depression - if selected complete PHQ 2-9, Anxiety - if selected complete GAD Behavioral Management Strategies: Coping strategies, Medication therapy, Support system Behavioral Health Self-Management Outcome: 3 (uncertain) Major Change/Loss/Stressor/Fears (CP): Medical condition, self Techniques to Cope with Loss/Stress/Change: Diversional activities Quality of Family Relationships: involved, helpful, supportive Do you feel physically threatened by others?: No    01/01/2024    PHQ2-9 Depression Screening   Little interest or pleasure in doing things Nearly every day  Feeling down, depressed, or hopeless Nearly every day  PHQ-2 - Total Score 6  Trouble falling or staying asleep, or sleeping too much Nearly every day  Feeling tired or having little energy Nearly every day  Poor appetite or overeating  Several days  Feeling bad about yourself - or that you are a failure or have let yourself or your family down More than half the days  Trouble concentrating on things, such as reading the newspaper or watching television More than half the days  Moving or speaking so slowly that other people could  have noticed.  Or the opposite - being so fidgety or  restless that you have been moving around a lot more than usual Nearly every day  Thoughts that you would be better off dead, or hurting yourself in some way Not at all  PHQ2-9 Total Score 20  If you checked off any problems, how difficult have these problems made it for you to do your work, take care of things at home, or get along with other people Somewhat difficult  Depression Interventions/Treatment Medication, Counseling      01/01/2024    4:34 PM 12/18/2023    2:23 PM 10/08/2023   10:07 AM 09/17/2023    2:34 PM  GAD 7 : Generalized Anxiety Score  Nervous, Anxious, on Edge 3 3 2 2   Control/stop worrying 3 3 2 2   Worry too much - different things 3 3 2 2   Trouble relaxing 1 2 2 1   Restless 1 2 2 2   Easily annoyed or irritable 1 2 2 2   Afraid - awful might happen 2 3 2 2   Total GAD 7 Score 14 18 14 13   Anxiety Difficulty  Extremely difficult Very difficult Very difficult   There were no vitals filed for this visit.  Medications Reviewed Today     Reviewed by Merlynn Lyle CROME, LCSW (Social Worker) on 01/01/24 at 1426  Med List Status: <None>   Medication Order Taking? Sig Documenting Provider Last Dose Status Informant  Ascorbic Acid  (VITAMIN C GUMMIE PO) [id number]  Take 1,000 mg by mouth in the morning. [provider]  Active Family Member  B Complex-C (B-COMPLEX WITH VITAMIN C) tablet [id number]  Take 1 tablet by mouth in the morning. [provider]  Active Family Member  Blood Glucose Monitoring Suppl DEVI [id number]  Use up to 4 times daily to check blood sugar. May substitute to any manufacturer covered by patient's insurance. Cook, Jayce G, DO  Active   calcitRIOL (ROCALTROL) 0.25 MCG capsule [id number]  Take 0.25 mcg by mouth every Monday, Wednesday, and Friday. [provider]  Active Family Member  Calcium -Phosphorus-Vitamin D  (CALCIUM  GUMMIES PO) [id number]  Take 2 tablets by mouth in the morning.  [provider]  Active Family Member  cephALEXin  (KEFLEX ) 500 MG capsule [id number]  Take 1 capsule (500 mg total) by mouth 2 (two) times daily. Matilda Senior, MD  Active   clobetasol (TEMOVATE) 0.05 % external solution [id number]  Apply 1 Application topically in the morning. Applied to the back of the neck [provider]  Active Family Member  clopidogrel  (PLAVIX ) 75 MG tablet [id number]  Take 1 tablet (75 mg total) by mouth in the morning. Cook, Jayce G, DO  Active   Continuous Glucose Receiver (FREESTYLE LIBRE 3 READER) DEVI [id number]  Use to check blood sugar continuously. Cook, Jayce G, DO  Active   Continuous Glucose Sensor (FREESTYLE LIBRE 3 SENSOR) MISC [id number]  Place 1 sensor on the skin every 14 days. Use to check glucose continuously Cook, Jayce G, DO  Active   doxazosin  (CARDURA ) 2 MG tablet [id number]  Take 1 tablet (2 mg total) by mouth at bedtime. Cook, Jayce G, DO  Active   escitalopram  (LEXAPRO ) 20 MG tablet [id number]  TAKE 1 TABLET BY MOUTH DAILY Cook, Jayce G, DO  Active   estradiol  (ESTRACE  VAGINAL) 0.1 MG/GM vaginal cream [id number]  Place 1 Applicatorful vaginally at bedtime. For 2 weeks, then 3 times a week. Cook, Jayce G, DO  Active   famotidine  (PEPCID ) 20 MG tablet [id number]  Take 1 tablet (20 mg total) by mouth  at bedtime. Cook, Jayce G, DO  Active   folic acid  (FOLVITE ) 800 MCG tablet [id number]  Take 800 mcg by mouth in the morning. [provider]  Active Family Member  furosemide  (LASIX ) 40 MG tablet [id number]  2 q am  Patient taking differently: Take 80 mg by mouth daily. 2 q am   Alphonsa Glendia LABOR, MD  Active   hydrALAZINE  (APRESOLINE ) 25 MG tablet [id number]  Take 1 tablet (25 mg total) by mouth in the morning and at bedtime. Cook, Jayce G, DO  Active   insulin  lispro (HUMALOG  KWIKPEN) 100 UNIT/ML KwikPen [id number]  INJECT 1 TO 10 UNITS UNDER THE SKIN THREE TIMES DAILY BEFORE MEALS PER SLIDING SCALE Cook, Jayce G, DO  Active   Insulin  Pen  Needle (BD PEN NEEDLE SHORT ULTRAFINE) 31G X 8 MM MISC [id number]  USE 4 TIMES DAILY FOR ADMINISTRATION OF INSULIN  Cook, Jayce G, DO  Active   LANTUS  SOLOSTAR 100 UNIT/ML Solostar Pen [id number]  INJECT 30 UNITS UNDER THE SKIN EVERY DAY Cook, Jayce G, DO  Active   levothyroxine  (SYNTHROID ) 75 MCG tablet [id number]  TAKE 1 TABLET BY MOUTH DAILY BEFORE BREAKFAST Cook, Jayce G, DO  Active   linagliptin  (TRADJENTA ) 5 MG TABS tablet [id number]  Take 1 tablet (5 mg total) by mouth daily. Thapa, [place], MD  Active   loratadine (CLARITIN) 10 MG tablet [id number]  Take 10 mg by mouth in the morning. [provider]  Active Family Member  Multiple Vitamin (MULTIVITAMIN WITH MINERALS) TABS tablet [id number]  Take 1 tablet by mouth in the morning. [provider]  Active Family Member  nitrofurantoin , macrocrystal-monohydrate, (MACROBID ) 100 MG capsule [id number]  Take 1 capsule (100 mg total) by mouth every 12 (twelve) hours. McKenzie, Belvie CROME, MD  Active   Olopatadine HCl (PATADAY OP) [id number]  Place 1 drop into both eyes in the morning. Once in the mornings in left eye [provider]  Active Family Member  omeprazole  (PRILOSEC) 40 MG capsule [id number]  Take 1 capsule (40 mg total) by mouth daily before breakfast. Cook, Jayce G, DO  Active   [facility] ULTRA test strip [id number]  USE UP TO 4 TIMES DAILY TO CHECK BLOOD SUGAR Bluford, Jayce G, DO  Active   polyethylene glycol (MIRALAX  / GLYCOLAX ) 17 g packet [id number]  Take 17 g by mouth every other day. [provider]  Active Family Member  potassium chloride  (KLOR-CON ) 10 MEQ tablet [id number]  Take 1 tablet (10 mEq total) by mouth daily. Cook, Jayce G, DO  Active   rOPINIRole  (REQUIP ) 2 MG tablet [id number]  Take 1 tablet (2 mg total) by mouth at bedtime. Cook, Jayce G, DO  Active   rosuvastatin  (CRESTOR ) 40 MG tablet [id number]  Take 1 tablet (40 mg total) by mouth at bedtime. Cook, Jayce G, DO  Active   Vibegron  (GEMTESA ) 75 MG TABS  [id number]  Take 1 tablet (75 mg total) by mouth daily. McKenzie, Belvie CROME, MD  Active   vitamin B-12 (CYANOCOBALAMIN ) 500 MCG tablet [id number]  Take 1,000 mcg by mouth in the morning. [provider]  Active Family Member           SDOH Interventions    Flowsheet Row Patient Outreach Telephone from 01/01/2024 in [place] [facility] Office Visit from 12/18/2023 in [facility] Clinical Support from 09/05/2023 in [facility] Office Visit from 08/08/2023 in [facility] Clinical Support from 08/30/2022 in  [facility]  SDOH Interventions       Food Insecurity Interventions -- -- Intervention Not Indicated -- AMB Referral  Housing Interventions -- -- Intervention Not Indicated -- Intervention Not Indicated  Transportation Interventions Intervention Not Indicated -- Intervention Not Indicated -- AMB Referral  Utilities Interventions Intervention Not Indicated -- Intervention Not Indicated -- Intervention Not Indicated  Alcohol Usage Interventions -- -- Intervention Not Indicated (Score <7) -- Intervention Not Indicated (Score <7)  Depression Interventions/Treatment  Medication, Counseling Medication, Counseling -- Counseling, Medication --  Financial Strain Interventions Intervention Not Indicated -- Intervention Not Indicated -- Intervention Not Indicated  Physical Activity Interventions -- -- Patient Declined -- Patient Declined  Stress Interventions Offered [employer], Provide Counseling -- Patient Declined -- Patient Declined  Social Connections Interventions Community Resources Provided, Intervention Not Indicated  [ADTS and Airline pilot provided to family] -- Intervention Not Indicated -- Intervention Not Indicated  Health Literacy Interventions -- -- Intervention Not Indicated -- --    Recommendation:   PCP Follow-up Continue Current  Plan of Care  Follow Up Plan:   Telephone follow-up in 1 month Referral to RN Case Manager BSW  Lyle Rung, BSW, MSW, LCSW Licensed Clinical Social Worker [employer]   [email or web address] Direct Dial : [phone]

## 2024-01-01 NOTE — Telephone Encounter (Signed)
 FYI Only or Action Required?: Action required by provider: clinical question for provider and update on patient condition.  Patient was last seen in primary care on 12/18/2023 by Kelly, Amy G, DO.  Called Nurse Triage reporting Depression.  Symptoms began several days ago.  Interventions attempted: Prescription medications: lexapro .  Symptoms are: gradually worsening.  Triage Disposition: See PCP When Office is Open (Within 3 Days)  Patient/caregiver understands and will follow disposition?: Yes  Copied from CRM [id number]. Topic: Clinical - Red Word Triage >> Jan 01, 2024  3:40 PM Amy Kelly wrote: [facility] that prompted transfer to Nurse Triage: Depression Reason for Disposition  [1] New or changed anti-depressant medication > 2 weeks ago AND [2] not feeling any better  Answer Assessment - Initial Assessment Questions Patient taking Lexapro , which was minimally helpful, son would like to know if Dr. Bluford would consider increasing her dosage, or a medication change. Scheduled to come in 11/3, will come in sooner if preferred by provider  Please call [phone], son Amy Kelly, on [place] for questions   1. CONCERN: What happened that made you call today?     Decreased mobility Crying spells in the middle of the night Panicky feelings 3. RISK OF HARM - SUICIDAL IDEATION:  Do you ever have thoughts of hurting or killing yourself?  (e.g., yes, no, no but preoccupation with thoughts about death)     denies 4. RISK OF HARM - HOMICIDAL IDEATION:  Do you ever have thoughts of hurting or killing someone else?  (e.g., yes, no, no but preoccupation with thoughts about death)     denies 5. FUNCTIONAL IMPAIRMENT: How have things been going for you overall? Have you had more difficulty than usual doing your normal daily activities?  (e.g., better, same, worse; self-care, school, work, interactions)     Decreased mobility 6. SUPPORT: Who is with you now? Who do you live with? Do you have  family or friends who you can talk to?      Son, who she lives next door to 7. THERAPIST: Do you have a counselor or therapist? If Yes, ask: What is their name?     Has tried therapist in the past, but it was not helpful 8. STRESSORS: Has there been any new stress or recent changes in your life?     Decreased mobility 9. ALCOHOL USE OR SUBSTANCE USE (DRUG USE): Do you drink alcohol or use any illegal drugs?     denies 11. PREGNANCY: Is there any chance you are pregnant? When was your last menstrual period?       N/a  Protocols used: Depression-A-AH

## 2024-01-05 ENCOUNTER — Other Ambulatory Visit: Payer: Self-pay | Admitting: Family Medicine

## 2024-01-05 DIAGNOSIS — M25522 Pain in left elbow: Secondary | ICD-10-CM | POA: Diagnosis not present

## 2024-01-05 DIAGNOSIS — Z853 Personal history of malignant neoplasm of breast: Secondary | ICD-10-CM | POA: Diagnosis not present

## 2024-01-05 DIAGNOSIS — I69354 Hemiplegia and hemiparesis following cerebral infarction affecting left non-dominant side: Secondary | ICD-10-CM | POA: Diagnosis not present

## 2024-01-05 DIAGNOSIS — I5032 Chronic diastolic (congestive) heart failure: Secondary | ICD-10-CM | POA: Diagnosis not present

## 2024-01-05 DIAGNOSIS — N184 Chronic kidney disease, stage 4 (severe): Secondary | ICD-10-CM | POA: Diagnosis not present

## 2024-01-05 DIAGNOSIS — Z9181 History of falling: Secondary | ICD-10-CM | POA: Diagnosis not present

## 2024-01-05 DIAGNOSIS — Z7984 Long term (current) use of oral hypoglycemic drugs: Secondary | ICD-10-CM | POA: Diagnosis not present

## 2024-01-05 DIAGNOSIS — E1165 Type 2 diabetes mellitus with hyperglycemia: Secondary | ICD-10-CM | POA: Diagnosis not present

## 2024-01-05 DIAGNOSIS — Z556 Problems related to health literacy: Secondary | ICD-10-CM | POA: Diagnosis not present

## 2024-01-05 DIAGNOSIS — Z7902 Long term (current) use of antithrombotics/antiplatelets: Secondary | ICD-10-CM | POA: Diagnosis not present

## 2024-01-05 DIAGNOSIS — E039 Hypothyroidism, unspecified: Secondary | ICD-10-CM | POA: Diagnosis not present

## 2024-01-05 DIAGNOSIS — E1122 Type 2 diabetes mellitus with diabetic chronic kidney disease: Secondary | ICD-10-CM | POA: Diagnosis not present

## 2024-01-05 DIAGNOSIS — N3941 Urge incontinence: Secondary | ICD-10-CM | POA: Diagnosis not present

## 2024-01-05 DIAGNOSIS — H905 Unspecified sensorineural hearing loss: Secondary | ICD-10-CM | POA: Diagnosis not present

## 2024-01-05 DIAGNOSIS — F419 Anxiety disorder, unspecified: Secondary | ICD-10-CM | POA: Diagnosis not present

## 2024-01-05 DIAGNOSIS — G2581 Restless legs syndrome: Secondary | ICD-10-CM | POA: Diagnosis not present

## 2024-01-05 DIAGNOSIS — Z794 Long term (current) use of insulin: Secondary | ICD-10-CM | POA: Diagnosis not present

## 2024-01-05 DIAGNOSIS — I130 Hypertensive heart and chronic kidney disease with heart failure and stage 1 through stage 4 chronic kidney disease, or unspecified chronic kidney disease: Secondary | ICD-10-CM | POA: Diagnosis not present

## 2024-01-05 DIAGNOSIS — R296 Repeated falls: Secondary | ICD-10-CM | POA: Diagnosis not present

## 2024-01-05 DIAGNOSIS — E1140 Type 2 diabetes mellitus with diabetic neuropathy, unspecified: Secondary | ICD-10-CM | POA: Diagnosis not present

## 2024-01-06 DIAGNOSIS — R296 Repeated falls: Secondary | ICD-10-CM | POA: Diagnosis not present

## 2024-01-06 DIAGNOSIS — Z7984 Long term (current) use of oral hypoglycemic drugs: Secondary | ICD-10-CM | POA: Diagnosis not present

## 2024-01-06 DIAGNOSIS — E1140 Type 2 diabetes mellitus with diabetic neuropathy, unspecified: Secondary | ICD-10-CM | POA: Diagnosis not present

## 2024-01-06 DIAGNOSIS — Z556 Problems related to health literacy: Secondary | ICD-10-CM | POA: Diagnosis not present

## 2024-01-06 DIAGNOSIS — Z9181 History of falling: Secondary | ICD-10-CM | POA: Diagnosis not present

## 2024-01-06 DIAGNOSIS — I69354 Hemiplegia and hemiparesis following cerebral infarction affecting left non-dominant side: Secondary | ICD-10-CM | POA: Diagnosis not present

## 2024-01-06 DIAGNOSIS — F419 Anxiety disorder, unspecified: Secondary | ICD-10-CM | POA: Diagnosis not present

## 2024-01-06 DIAGNOSIS — Z794 Long term (current) use of insulin: Secondary | ICD-10-CM | POA: Diagnosis not present

## 2024-01-06 DIAGNOSIS — E039 Hypothyroidism, unspecified: Secondary | ICD-10-CM | POA: Diagnosis not present

## 2024-01-06 DIAGNOSIS — N184 Chronic kidney disease, stage 4 (severe): Secondary | ICD-10-CM | POA: Diagnosis not present

## 2024-01-06 DIAGNOSIS — Z853 Personal history of malignant neoplasm of breast: Secondary | ICD-10-CM | POA: Diagnosis not present

## 2024-01-06 DIAGNOSIS — I5032 Chronic diastolic (congestive) heart failure: Secondary | ICD-10-CM | POA: Diagnosis not present

## 2024-01-06 DIAGNOSIS — H905 Unspecified sensorineural hearing loss: Secondary | ICD-10-CM | POA: Diagnosis not present

## 2024-01-06 DIAGNOSIS — Z7902 Long term (current) use of antithrombotics/antiplatelets: Secondary | ICD-10-CM | POA: Diagnosis not present

## 2024-01-06 DIAGNOSIS — I130 Hypertensive heart and chronic kidney disease with heart failure and stage 1 through stage 4 chronic kidney disease, or unspecified chronic kidney disease: Secondary | ICD-10-CM | POA: Diagnosis not present

## 2024-01-06 DIAGNOSIS — E1122 Type 2 diabetes mellitus with diabetic chronic kidney disease: Secondary | ICD-10-CM | POA: Diagnosis not present

## 2024-01-06 DIAGNOSIS — M25522 Pain in left elbow: Secondary | ICD-10-CM | POA: Diagnosis not present

## 2024-01-06 DIAGNOSIS — E1165 Type 2 diabetes mellitus with hyperglycemia: Secondary | ICD-10-CM | POA: Diagnosis not present

## 2024-01-06 DIAGNOSIS — N3941 Urge incontinence: Secondary | ICD-10-CM | POA: Diagnosis not present

## 2024-01-06 DIAGNOSIS — G2581 Restless legs syndrome: Secondary | ICD-10-CM | POA: Diagnosis not present

## 2024-01-07 ENCOUNTER — Encounter: Payer: Self-pay | Admitting: Neurology

## 2024-01-07 DIAGNOSIS — Z556 Problems related to health literacy: Secondary | ICD-10-CM | POA: Diagnosis not present

## 2024-01-07 DIAGNOSIS — E1165 Type 2 diabetes mellitus with hyperglycemia: Secondary | ICD-10-CM | POA: Diagnosis not present

## 2024-01-07 DIAGNOSIS — N184 Chronic kidney disease, stage 4 (severe): Secondary | ICD-10-CM | POA: Diagnosis not present

## 2024-01-07 DIAGNOSIS — I69354 Hemiplegia and hemiparesis following cerebral infarction affecting left non-dominant side: Secondary | ICD-10-CM | POA: Diagnosis not present

## 2024-01-07 DIAGNOSIS — I5032 Chronic diastolic (congestive) heart failure: Secondary | ICD-10-CM | POA: Diagnosis not present

## 2024-01-07 DIAGNOSIS — Z794 Long term (current) use of insulin: Secondary | ICD-10-CM | POA: Diagnosis not present

## 2024-01-07 DIAGNOSIS — E1122 Type 2 diabetes mellitus with diabetic chronic kidney disease: Secondary | ICD-10-CM | POA: Diagnosis not present

## 2024-01-07 DIAGNOSIS — G2581 Restless legs syndrome: Secondary | ICD-10-CM | POA: Diagnosis not present

## 2024-01-07 DIAGNOSIS — I130 Hypertensive heart and chronic kidney disease with heart failure and stage 1 through stage 4 chronic kidney disease, or unspecified chronic kidney disease: Secondary | ICD-10-CM | POA: Diagnosis not present

## 2024-01-07 DIAGNOSIS — Z7984 Long term (current) use of oral hypoglycemic drugs: Secondary | ICD-10-CM | POA: Diagnosis not present

## 2024-01-07 DIAGNOSIS — Z9181 History of falling: Secondary | ICD-10-CM | POA: Diagnosis not present

## 2024-01-07 DIAGNOSIS — Z853 Personal history of malignant neoplasm of breast: Secondary | ICD-10-CM | POA: Diagnosis not present

## 2024-01-07 DIAGNOSIS — N3941 Urge incontinence: Secondary | ICD-10-CM | POA: Diagnosis not present

## 2024-01-07 DIAGNOSIS — F419 Anxiety disorder, unspecified: Secondary | ICD-10-CM | POA: Diagnosis not present

## 2024-01-07 DIAGNOSIS — E1140 Type 2 diabetes mellitus with diabetic neuropathy, unspecified: Secondary | ICD-10-CM | POA: Diagnosis not present

## 2024-01-07 DIAGNOSIS — H905 Unspecified sensorineural hearing loss: Secondary | ICD-10-CM | POA: Diagnosis not present

## 2024-01-07 DIAGNOSIS — Z7902 Long term (current) use of antithrombotics/antiplatelets: Secondary | ICD-10-CM | POA: Diagnosis not present

## 2024-01-07 DIAGNOSIS — R296 Repeated falls: Secondary | ICD-10-CM | POA: Diagnosis not present

## 2024-01-07 DIAGNOSIS — E039 Hypothyroidism, unspecified: Secondary | ICD-10-CM | POA: Diagnosis not present

## 2024-01-07 DIAGNOSIS — M25522 Pain in left elbow: Secondary | ICD-10-CM | POA: Diagnosis not present

## 2024-01-08 ENCOUNTER — Other Ambulatory Visit: Payer: Self-pay | Admitting: Family Medicine

## 2024-01-09 ENCOUNTER — Other Ambulatory Visit: Payer: Self-pay

## 2024-01-09 NOTE — Patient Outreach (Signed)
 Complex Care Management   Visit Note  01/09/2024  Name:  Amy Kelly MRN: [id number] DOB: [birth year]  Situation: Referral received for Complex Care Management related to SDOH Barriers:  Personal Care I obtained verbal consent from Caregiver.  Visit completed with Caregiver  on the phone  Background:   Past Medical History:  Diagnosis Date   Allergy    Anxiety and depression    Arthritis    At risk for falls    Cancer [facility])    Cataract    CHF (congestive heart failure) (HCC)    Chronic kidney disease    Coronary artery disease    Diabetes mellitus (HCC) 11/08/2020   Diabetes mellitus without complication (HCC)    GERD (gastroesophageal reflux disease)    Heart murmur    History of skin cancer    Right shin   HOH (hard of hearing)    Hyperlipidemia    Hypertension    Hypothyroidism    Neuromuscular disorder (HCC)    Osteoporosis    Oxygen deficiency    Stroke [facility])    Ulcer     Assessment:  Patient son reports that patient has home health providing OT/PT/Bath and a nurse checking vitals. Mr. Stobaugh completed Meal on Wheels request and is on the waiting list. Mr. Furney reports he is the only caregiver and needs help with preparing meals and housekeeping as he does not live in the home. Mr. Jastrzebski tried [employer] last year but it did not work and was not able to use the service. Mr. Ballo agreed to allow SW to call [employer]. SW t/c [employer] and is informed of the 60 hrs of personal care that patient is eligible to receive. [employer] transfers call to Care Link [phone]. Care Link places a new request to search for a provider. Care Link does confirm the previous provided did not show for services. Patient son is instructed to call back after 5 days if no one contacts from Care Link.   SDOH Interventions    Flowsheet Row Patient Outreach Telephone from 01/09/2024 in [place] POPULATION HEALTH DEPARTMENT Patient Outreach Telephone from 01/01/2024 in [place] POPULATION HEALTH DEPARTMENT  Office Visit from 12/18/2023 in [facility] Clinical Support from 09/05/2023 in [facility] Office Visit from 08/08/2023 in [facility] Clinical Support from 08/30/2022 in [facility]  SDOH Interventions        Food Insecurity Interventions Other (Comment)  [On waiting list of Meals on Wheels] -- -- Intervention Not Indicated -- AMB Referral  Housing Interventions Intervention Not Indicated -- -- Intervention Not Indicated -- Intervention Not Indicated  Transportation Interventions -- Intervention Not Indicated -- Intervention Not Indicated -- AMB Referral  Utilities Interventions -- Intervention Not Indicated -- Intervention Not Indicated -- Intervention Not Indicated  Alcohol Usage Interventions -- -- -- Intervention Not Indicated (Score <7) -- Intervention Not Indicated (Score <7)  Depression Interventions/Treatment  -- Medication, Counseling Medication, Counseling -- Counseling, Medication --  Financial Strain Interventions -- Intervention Not Indicated -- Intervention Not Indicated -- Intervention Not Indicated  Physical Activity Interventions -- -- -- Patient Declined -- Patient Declined  Stress Interventions -- [employer], Provide Counseling -- Patient Declined -- Patient Declined  Social Connections Interventions -- Community Resources Provided, Intervention Not Indicated  [ADTS and Airline pilot provided to family] -- Intervention Not Indicated -- Intervention Not Indicated  Health Literacy Interventions -- -- -- Intervention Not Indicated -- --  Recommendation:   None  Follow Up Plan:   Telephone follow up appointment date/time:  01/21/24 at 2pm  [name], BSW [place]  [facility], [facility] Social Worker Direct Dial : [phone]  Fax: [phone] Website: [email or web address]

## 2024-01-09 NOTE — Patient Instructions (Signed)
 Visit Information  Thank you for taking time to visit with me today. Please don't hesitate to contact me if I can be of assistance to you before our next scheduled appointment.  Our next appointment is by telephone on 01/21/24 at 2pm Please call the care guide team at [phone] if you need to cancel or reschedule your appointment.   Following is a copy of your care plan:   Goals Addressed             This Visit's Progress    BSW VBCI Social Work Care Plan       Problems:   Personal care  CSW Clinical Goal(s):   Over the next 10 days the Caregiver will will follow up with Care Link as directed by Social Work.  Interventions:  Social Determinants of Health in Patient with CKD Stage 4, DMII, and HTN: SDOH assessments completed: Personal care Evaluation of current treatment plan related to unmet needs Patient son reports that patient has home health providing OT/PT/Bath and a nurse checking vitals.  Mr. Happ completed Meal on Wheels request and is on the waiting list.  Mr. Alberty reports he is the only caregiver and needs help with preparing meals and housekeeping as he does not live in the home. Mr. Kalbfleisch tried Care Link last year but it did not work and was not able to use the service.  Mr. Bacchi agreed to allow SW to call [employer]. SW t/c [employer] and is informed of the 60 hrs of personal care that patient is eligible to receive.  BCBS transfers call to Care Link [phone].  Care Link places a new request to search for a provider.  Care Link does confirm the previous provided did not show for services.  Patient son is instructed to call back after 5 days if no one contacts from Care Link.      Patient Goals/Self-Care Activities:  Patient and caregiver will await contact from Care Link and follow up in 5 days.  Plan:   Telephone follow up appointment with care management team member scheduled for:  01/21/24 at 2pm        Please call 911 if you are experiencing a Mental Health or  Behavioral Health Crisis or need someone to talk to.  Son Khandi Kernes verbalized understanding of Care plan and visit instructions communicated this visit  [name], BSW [place]  [facility], [facility] Social Worker Direct Dial : [phone]  Fax: [phone] Website: [email or web address]

## 2024-01-13 DIAGNOSIS — N3941 Urge incontinence: Secondary | ICD-10-CM | POA: Diagnosis not present

## 2024-01-13 DIAGNOSIS — Z9181 History of falling: Secondary | ICD-10-CM | POA: Diagnosis not present

## 2024-01-13 DIAGNOSIS — H905 Unspecified sensorineural hearing loss: Secondary | ICD-10-CM | POA: Diagnosis not present

## 2024-01-13 DIAGNOSIS — Z853 Personal history of malignant neoplasm of breast: Secondary | ICD-10-CM | POA: Diagnosis not present

## 2024-01-13 DIAGNOSIS — Z7984 Long term (current) use of oral hypoglycemic drugs: Secondary | ICD-10-CM | POA: Diagnosis not present

## 2024-01-13 DIAGNOSIS — F419 Anxiety disorder, unspecified: Secondary | ICD-10-CM | POA: Diagnosis not present

## 2024-01-13 DIAGNOSIS — I130 Hypertensive heart and chronic kidney disease with heart failure and stage 1 through stage 4 chronic kidney disease, or unspecified chronic kidney disease: Secondary | ICD-10-CM | POA: Diagnosis not present

## 2024-01-13 DIAGNOSIS — R296 Repeated falls: Secondary | ICD-10-CM | POA: Diagnosis not present

## 2024-01-13 DIAGNOSIS — E1165 Type 2 diabetes mellitus with hyperglycemia: Secondary | ICD-10-CM | POA: Diagnosis not present

## 2024-01-13 DIAGNOSIS — Z556 Problems related to health literacy: Secondary | ICD-10-CM | POA: Diagnosis not present

## 2024-01-13 DIAGNOSIS — I69354 Hemiplegia and hemiparesis following cerebral infarction affecting left non-dominant side: Secondary | ICD-10-CM | POA: Diagnosis not present

## 2024-01-13 DIAGNOSIS — E1140 Type 2 diabetes mellitus with diabetic neuropathy, unspecified: Secondary | ICD-10-CM | POA: Diagnosis not present

## 2024-01-13 DIAGNOSIS — Z7902 Long term (current) use of antithrombotics/antiplatelets: Secondary | ICD-10-CM | POA: Diagnosis not present

## 2024-01-13 DIAGNOSIS — E1122 Type 2 diabetes mellitus with diabetic chronic kidney disease: Secondary | ICD-10-CM | POA: Diagnosis not present

## 2024-01-13 DIAGNOSIS — M25522 Pain in left elbow: Secondary | ICD-10-CM | POA: Diagnosis not present

## 2024-01-13 DIAGNOSIS — Z794 Long term (current) use of insulin: Secondary | ICD-10-CM | POA: Diagnosis not present

## 2024-01-13 DIAGNOSIS — N184 Chronic kidney disease, stage 4 (severe): Secondary | ICD-10-CM | POA: Diagnosis not present

## 2024-01-13 DIAGNOSIS — E039 Hypothyroidism, unspecified: Secondary | ICD-10-CM | POA: Diagnosis not present

## 2024-01-13 DIAGNOSIS — G2581 Restless legs syndrome: Secondary | ICD-10-CM | POA: Diagnosis not present

## 2024-01-13 DIAGNOSIS — I5032 Chronic diastolic (congestive) heart failure: Secondary | ICD-10-CM | POA: Diagnosis not present

## 2024-01-14 DIAGNOSIS — E1140 Type 2 diabetes mellitus with diabetic neuropathy, unspecified: Secondary | ICD-10-CM | POA: Diagnosis not present

## 2024-01-14 DIAGNOSIS — M25522 Pain in left elbow: Secondary | ICD-10-CM | POA: Diagnosis not present

## 2024-01-14 DIAGNOSIS — E1165 Type 2 diabetes mellitus with hyperglycemia: Secondary | ICD-10-CM | POA: Diagnosis not present

## 2024-01-14 DIAGNOSIS — Z7984 Long term (current) use of oral hypoglycemic drugs: Secondary | ICD-10-CM | POA: Diagnosis not present

## 2024-01-14 DIAGNOSIS — I69354 Hemiplegia and hemiparesis following cerebral infarction affecting left non-dominant side: Secondary | ICD-10-CM | POA: Diagnosis not present

## 2024-01-14 DIAGNOSIS — H905 Unspecified sensorineural hearing loss: Secondary | ICD-10-CM | POA: Diagnosis not present

## 2024-01-14 DIAGNOSIS — E1122 Type 2 diabetes mellitus with diabetic chronic kidney disease: Secondary | ICD-10-CM | POA: Diagnosis not present

## 2024-01-14 DIAGNOSIS — N3941 Urge incontinence: Secondary | ICD-10-CM | POA: Diagnosis not present

## 2024-01-14 DIAGNOSIS — Z7902 Long term (current) use of antithrombotics/antiplatelets: Secondary | ICD-10-CM | POA: Diagnosis not present

## 2024-01-14 DIAGNOSIS — I5032 Chronic diastolic (congestive) heart failure: Secondary | ICD-10-CM | POA: Diagnosis not present

## 2024-01-14 DIAGNOSIS — Z556 Problems related to health literacy: Secondary | ICD-10-CM | POA: Diagnosis not present

## 2024-01-14 DIAGNOSIS — Z794 Long term (current) use of insulin: Secondary | ICD-10-CM | POA: Diagnosis not present

## 2024-01-14 DIAGNOSIS — I130 Hypertensive heart and chronic kidney disease with heart failure and stage 1 through stage 4 chronic kidney disease, or unspecified chronic kidney disease: Secondary | ICD-10-CM | POA: Diagnosis not present

## 2024-01-14 DIAGNOSIS — R296 Repeated falls: Secondary | ICD-10-CM | POA: Diagnosis not present

## 2024-01-14 DIAGNOSIS — N184 Chronic kidney disease, stage 4 (severe): Secondary | ICD-10-CM | POA: Diagnosis not present

## 2024-01-14 DIAGNOSIS — G2581 Restless legs syndrome: Secondary | ICD-10-CM | POA: Diagnosis not present

## 2024-01-14 DIAGNOSIS — Z853 Personal history of malignant neoplasm of breast: Secondary | ICD-10-CM | POA: Diagnosis not present

## 2024-01-14 DIAGNOSIS — E039 Hypothyroidism, unspecified: Secondary | ICD-10-CM | POA: Diagnosis not present

## 2024-01-14 DIAGNOSIS — F419 Anxiety disorder, unspecified: Secondary | ICD-10-CM | POA: Diagnosis not present

## 2024-01-14 DIAGNOSIS — Z9181 History of falling: Secondary | ICD-10-CM | POA: Diagnosis not present

## 2024-01-16 ENCOUNTER — Other Ambulatory Visit: Payer: Self-pay | Admitting: Licensed Clinical Social Worker

## 2024-01-16 DIAGNOSIS — I5032 Chronic diastolic (congestive) heart failure: Secondary | ICD-10-CM | POA: Diagnosis not present

## 2024-01-16 DIAGNOSIS — Z9181 History of falling: Secondary | ICD-10-CM | POA: Diagnosis not present

## 2024-01-16 DIAGNOSIS — M25522 Pain in left elbow: Secondary | ICD-10-CM | POA: Diagnosis not present

## 2024-01-16 DIAGNOSIS — F419 Anxiety disorder, unspecified: Secondary | ICD-10-CM | POA: Diagnosis not present

## 2024-01-16 DIAGNOSIS — H905 Unspecified sensorineural hearing loss: Secondary | ICD-10-CM | POA: Diagnosis not present

## 2024-01-16 DIAGNOSIS — Z556 Problems related to health literacy: Secondary | ICD-10-CM | POA: Diagnosis not present

## 2024-01-16 DIAGNOSIS — E1122 Type 2 diabetes mellitus with diabetic chronic kidney disease: Secondary | ICD-10-CM | POA: Diagnosis not present

## 2024-01-16 DIAGNOSIS — Z794 Long term (current) use of insulin: Secondary | ICD-10-CM | POA: Diagnosis not present

## 2024-01-16 DIAGNOSIS — G2581 Restless legs syndrome: Secondary | ICD-10-CM | POA: Diagnosis not present

## 2024-01-16 DIAGNOSIS — E039 Hypothyroidism, unspecified: Secondary | ICD-10-CM | POA: Diagnosis not present

## 2024-01-16 DIAGNOSIS — E1140 Type 2 diabetes mellitus with diabetic neuropathy, unspecified: Secondary | ICD-10-CM | POA: Diagnosis not present

## 2024-01-16 DIAGNOSIS — Z7984 Long term (current) use of oral hypoglycemic drugs: Secondary | ICD-10-CM | POA: Diagnosis not present

## 2024-01-16 DIAGNOSIS — I69354 Hemiplegia and hemiparesis following cerebral infarction affecting left non-dominant side: Secondary | ICD-10-CM | POA: Diagnosis not present

## 2024-01-16 DIAGNOSIS — Z7902 Long term (current) use of antithrombotics/antiplatelets: Secondary | ICD-10-CM | POA: Diagnosis not present

## 2024-01-16 DIAGNOSIS — I130 Hypertensive heart and chronic kidney disease with heart failure and stage 1 through stage 4 chronic kidney disease, or unspecified chronic kidney disease: Secondary | ICD-10-CM | POA: Diagnosis not present

## 2024-01-16 DIAGNOSIS — N3941 Urge incontinence: Secondary | ICD-10-CM | POA: Diagnosis not present

## 2024-01-16 DIAGNOSIS — R296 Repeated falls: Secondary | ICD-10-CM | POA: Diagnosis not present

## 2024-01-16 DIAGNOSIS — Z853 Personal history of malignant neoplasm of breast: Secondary | ICD-10-CM | POA: Diagnosis not present

## 2024-01-16 DIAGNOSIS — N184 Chronic kidney disease, stage 4 (severe): Secondary | ICD-10-CM | POA: Diagnosis not present

## 2024-01-16 DIAGNOSIS — E1165 Type 2 diabetes mellitus with hyperglycemia: Secondary | ICD-10-CM | POA: Diagnosis not present

## 2024-01-16 NOTE — Patient Outreach (Signed)
 Complex Care Management   Visit Note  01/16/2024  Name:  Amy Kelly MRN: [id number] DOB: 1928/08/17  Situation: Referral received for Complex Care Management related to Mental/Behavioral Health diagnosis anxiety. I obtained verbal consent from Patient.  Visit completed with Caregiver  on the phone  Background:   Past Medical History:  Diagnosis Date   Allergy    Anxiety and depression    Arthritis    At risk for falls    Cancer [facility])    Cataract    CHF (congestive heart failure) (HCC)    Chronic kidney disease    Coronary artery disease    Diabetes mellitus (HCC) 11/08/2020   Diabetes mellitus without complication (HCC)    GERD (gastroesophageal reflux disease)    Heart murmur    History of skin cancer    Right shin   HOH (hard of hearing)    Hyperlipidemia    Hypertension    Hypothyroidism    Neuromuscular disorder (HCC)    Osteoporosis    Oxygen deficiency    Stroke [facility])    Ulcer     Assessment: Patient Reported Symptoms:  Cognitive Cognitive Status: Unable to Assess Cognitive/Intellectual Conditions Management [RPT]: Behavior Disorders Behavior Disorders: Dementia dx   Health Maintenance Behaviors: Annual physical exam, Stress management Healing Pattern: Average Health Facilitated by: Rest, Stress management  Neurological Neurological Review of Symptoms: Weakness Neurological Management Strategies: Adequate rest, Routine screening Neurological Self-Management Outcome: 3 (uncertain)  HEENT HEENT Symptoms Reported: No symptoms reported HEENT Management Strategies: Routine screening HEENT Self-Management Outcome: 4 (good)    Cardiovascular Cardiovascular Symptoms Reported: Fatigue Does patient have uncontrolled Hypertension?: Yes Is patient checking Blood Pressure at home?: No Cardiovascular Management Strategies: Routine screening, Medication therapy Cardiovascular Self-Management Outcome: 4 (good)  Respiratory Respiratory Symptoms Reported: Shortness of  breath Respiratory Management Strategies: Adequate rest Respiratory Self-Management Outcome: 3 (uncertain)  Endocrine Endocrine Symptoms Reported: Not assessed Is patient diabetic?: No Endocrine Self-Management Outcome: 4 (good)  Gastrointestinal Gastrointestinal Symptoms Reported: Change in appetite Gastrointestinal Management Strategies: Adequate rest Gastrointestinal Self-Management Outcome: 3 (uncertain)    Genitourinary Genitourinary Symptoms Reported: No symptoms reported Genitourinary Self-Management Outcome: 4 (good)  Integumentary Integumentary Symptoms Reported: No symptoms reported Skin Management Strategies: Routine screening Skin Self-Management Outcome: 4 (good)  Musculoskeletal Musculoskelatal Symptoms Reviewed: Difficulty walking, Weakness, Unsteady gait Musculoskeletal Management Strategies: Coping strategies, Medication therapy, Routine screening Musculoskeletal Self-Management Outcome: 2 (bad) Falls in the past year?: Yes Number of falls in past year: 1 or less Was there an injury with Fall?: Yes Fall Risk Category Calculator: 2 Patient Fall Risk Level: Moderate Fall Risk Patient at Risk for Falls Due to: Impaired mobility Fall risk Follow up: Falls prevention discussed  Psychosocial Psychosocial Symptoms Reported: Anxiety - if selected complete GAD Behavioral Management Strategies: Coping strategies, Medication therapy Behavioral Health Self-Management Outcome: 3 (uncertain) Major Change/Loss/Stressor/Fears (CP): Medical condition, self Techniques to Cope with Loss/Stress/Change: Diversional activities Quality of Family Relationships: involved, helpful Do you feel physically threatened by others?: No    01/16/2024    PHQ2-9 Depression Screening   Little interest or pleasure in doing things Nearly every day  Feeling down, depressed, or hopeless Nearly every day  PHQ-2 - Total Score 6  Trouble falling or staying asleep, or sleeping too much Nearly every day   Feeling tired or having little energy Nearly every day  Poor appetite or overeating  Several days  Feeling bad about yourself - or that you are a failure or have let yourself or your family  down More than half the days  Trouble concentrating on things, such as reading the newspaper or watching television Nearly every day  Moving or speaking so slowly that other people could have noticed.  Or the opposite - being so fidgety or restless that you have been moving around a lot more than usual Several days  Thoughts that you would be better off dead, or hurting yourself in some way Not at all  PHQ2-9 Total Score 19  If you checked off any problems, how difficult have these problems made it for you to do your work, take care of things at home, or get along with other people Somewhat difficult  Depression Interventions/Treatment Medication, Counseling      01/16/2024    1:17 PM 01/01/2024    4:34 PM 12/18/2023    2:23 PM 10/08/2023   10:07 AM  GAD 7 : Generalized Anxiety Score  Nervous, Anxious, on Edge 3 3 3 2   Control/stop worrying 3 3 3 2   Worry too much - different things 3 3 3 2   Trouble relaxing 1 1 2 2   Restless 0 1 2 2   Easily annoyed or irritable 1 1 2 2   Afraid - awful might happen 1 2 3 2   Total GAD 7 Score 12 14 18 14   Anxiety Difficulty Extremely difficult  Extremely difficult Very difficult    SDOH Interventions    Flowsheet Row Patient Outreach Telephone from 01/16/2024 in [place] POPULATION HEALTH DEPARTMENT Patient Outreach Telephone from 01/09/2024 in [place] POPULATION HEALTH DEPARTMENT Patient Outreach Telephone from 01/01/2024 in  POPULATION HEALTH DEPARTMENT Office Visit from 12/18/2023 in [facility] Clinical Support from 09/05/2023 in [facility] [place] [facility] Office Visit from 08/08/2023 in [facility]  SDOH Interventions        Food Insecurity Interventions Other (Comment)  [On wait list  for Meals on Wheels which is a long wait] Other (Comment)  [On waiting list of Meals on Wheels] -- -- Intervention Not Indicated --  Housing Interventions Intervention Not Indicated Intervention Not Indicated -- -- Intervention Not Indicated --  Transportation Interventions Intervention Not Indicated -- Intervention Not Indicated -- Intervention Not Indicated --  Utilities Interventions -- -- Intervention Not Indicated -- Intervention Not Indicated --  Alcohol Usage Interventions -- -- -- -- Intervention Not Indicated (Score <7) --  Depression Interventions/Treatment  Medication, Counseling -- Medication, Counseling Medication, Counseling -- Counseling, Medication  Financial Strain Interventions Community Resources Provided -- Intervention Not Indicated -- Intervention Not Indicated --  Physical Activity Interventions -- -- -- -- Patient Declined --  Stress Interventions Offered [employer], Provide Counseling -- Offered [employer], Provide Counseling -- Patient Declined --  Social Connections Interventions -- -- Programmer, Applications Provided, Intervention Not Indicated  [ADTS and Airline Pilot provided to family] -- Intervention Not Indicated --  Health Literacy Interventions -- -- -- -- Intervention Not Indicated --   There were no vitals filed for this visit.  Medications Reviewed Today     Reviewed by Merlynn Lyle CROME, LCSW (Social Worker) on 01/16/24 at 1314  Med List Status: <None>   Medication Order Taking? Sig Documenting Provider Last Dose Status Informant  Ascorbic Acid  (VITAMIN C GUMMIE PO) [id number]  Take 1,000 mg by mouth in the morning. [provider]  Active Family Member  B Complex-C (B-COMPLEX WITH VITAMIN C) tablet [id number]  Take 1 tablet by mouth in the morning. [provider]  Active  Family Member  Blood Glucose Monitoring Suppl DEVI [id number]  Use up to 4 times daily to check blood sugar. May substitute to any  manufacturer covered by patient's insurance. Cook, Jayce G, DO  Active   calcitRIOL (ROCALTROL) 0.25 MCG capsule [id number]  Take 0.25 mcg by mouth every Monday, Wednesday, and Friday. [provider]  Active Family Member  Calcium -Phosphorus-Vitamin D  (CALCIUM  GUMMIES PO) [id number]  Take 2 tablets by mouth in the morning. [provider]  Active Family Member  cephALEXin  (KEFLEX ) 500 MG capsule [id number]  Take 1 capsule (500 mg total) by mouth 2 (two) times daily. Matilda Senior, MD  Active   clobetasol (TEMOVATE) 0.05 % external solution [id number]  Apply 1 Application topically in the morning. Applied to the back of the neck [provider]  Active Family Member  clopidogrel  (PLAVIX ) 75 MG tablet [id number]  TAKE 1 TABLET BY MOUTH IN THE MORNING [place], Jayce G, DO  Active   Continuous Glucose Receiver (FREESTYLE LIBRE 3 READER) DEVI [id number]  Use to check blood sugar continuously. Cook, Jayce G, DO  Active   Continuous Glucose Sensor (FREESTYLE LIBRE 3 SENSOR) MISC [id number]  Place 1 sensor on the skin every 14 days. Use to check glucose continuously Cook, Jayce G, DO  Active   doxazosin  (CARDURA ) 2 MG tablet [id number]  TAKE 1 TABLET BY MOUTH AT BEDTIME Cook, Jayce G, DO  Active   escitalopram  (LEXAPRO ) 20 MG tablet [id number]  TAKE 1 TABLET BY MOUTH DAILY Cook, Jayce G, DO  Active   estradiol  (ESTRACE  VAGINAL) 0.1 MG/GM vaginal cream [id number]  Place 1 Applicatorful vaginally at bedtime. For 2 weeks, then 3 times a week. Cook, Jayce G, DO  Active   famotidine  (PEPCID ) 20 MG tablet [id number]  Take 1 tablet (20 mg total) by mouth at bedtime. Cook, Jayce G, DO  Active   folic acid  (FOLVITE ) 800 MCG tablet [id number]  Take 800 mcg by mouth in the morning. [provider]  Active Family Member  furosemide  (LASIX ) 40 MG tablet [id number]  2 q am  Patient taking differently: Take 80 mg by mouth daily. 2 q am   Alphonsa Glendia LABOR, MD  Active   hydrALAZINE  (APRESOLINE ) 25 MG  tablet [id number]  Take 1 tablet (25 mg total) by mouth in the morning and at bedtime. Bluford, Jayce G, DO  Active   insulin  lispro (HUMALOG  KWIKPEN) 100 UNIT/ML KwikPen [id number]  INJECT 1 TO 10 UNITS UNDER THE SKIN THREE TIMES DAILY BEFORE MEALS PER SLIDING SCALE Cook, Jayce G, DO  Active   Insulin  Pen Needle (BD PEN NEEDLE SHORT ULTRAFINE) 31G X 8 MM MISC [id number]  USE 4 TIMES DAILY FOR ADMINISTRATION OF INSULIN  Cook, Jayce G, DO  Active   LANTUS  SOLOSTAR 100 UNIT/ML Solostar Pen [id number]  INJECT 30 UNITS UNDER THE SKIN EVERY DAY Cook, Jayce G, DO  Active   levothyroxine  (SYNTHROID ) 75 MCG tablet [id number]  TAKE 1 TABLET BY MOUTH DAILY BEFORE BREAKFAST Cook, Jayce G, DO  Active   linagliptin  (TRADJENTA ) 5 MG TABS tablet [id number]  Take 1 tablet (5 mg total) by mouth daily. Thapa, [place], MD  Active   loratadine (CLARITIN) 10 MG tablet [id number]  Take 10 mg by mouth in the morning. [provider]  Active Family Member  Multiple Vitamin (MULTIVITAMIN WITH MINERALS) TABS tablet [id number]  Take 1 tablet by mouth in the morning. [provider]  Active Family Member  nitrofurantoin , macrocrystal-monohydrate, (MACROBID ) 100 MG capsule [id number]  Take 1  capsule (100 mg total) by mouth every 12 (twelve) hours. McKenzie, Belvie CROME, MD  Active   Olopatadine HCl (PATADAY OP) [id number]  Place 1 drop into both eyes in the morning. Once in the mornings in left eye [provider]  Active Family Member  omeprazole  (PRILOSEC) 40 MG capsule [id number]  TAKE ONE CAPSULE BY MOUTH DAILY BEFORE BREAKFAST Cook, Jayce G, DO  Active   ONETOUCH ULTRA test strip [id number]  USE UP TO 4 TIMES DAILY TO CHECK BLOOD SUGAR Bluford, Jayce G, DO  Active   polyethylene glycol (MIRALAX  / GLYCOLAX ) 17 g packet [id number]  Take 17 g by mouth every other day. [provider]  Active Family Member  potassium chloride  (KLOR-CON ) 10 MEQ tablet [id number]  Take 1 tablet (10 mEq total) by mouth daily. Cook, Jayce  G, DO  Active   rOPINIRole  (REQUIP ) 2 MG tablet [id number]  Take 1 tablet (2 mg total) by mouth at bedtime. Cook, Jayce G, DO  Active   rosuvastatin  (CRESTOR ) 40 MG tablet [id number]  Take 1 tablet (40 mg total) by mouth at bedtime. Cook, Jayce G, DO  Active   Vibegron  (GEMTESA ) 75 MG TABS [id number]  Take 1 tablet (75 mg total) by mouth daily. McKenzie, Belvie CROME, MD  Active   vitamin B-12 (CYANOCOBALAMIN ) 500 MCG tablet [id number]  Take 1,000 mcg by mouth in the morning. [provider]  Active Family Member            Recommendation:   PCP Follow-up Continue Current Plan of Care  Follow Up Plan:   Telephone follow-up in 1 month  Lyle Rung, BSW, MSW, LCSW Licensed Clinical Social Worker [employer]   [email or web address] Direct Dial : [phone]

## 2024-01-16 NOTE — Patient Instructions (Signed)
 Visit Information  Thank you for taking time to visit with me today. Please don't hesitate to contact me if I can be of assistance to you before our next scheduled appointment.  Your next care management appointment is by telephone on 02/10/24 at 1 pm  Telephone follow-up in 1 month  Please call the care guide team at [phone] if you need to cancel, schedule, or reschedule an appointment.   Please call the Suicide and Crisis Lifeline: 988 call the [place]  National Suicide Prevention Lifeline: 680-472-8649 or TTY: (256) 762-1701 TTY 541-633-9632) to talk to a trained counselor call 1-800-273-TALK (toll free, 24 hour hotline) call the [facility]: [phone] call 911 if you are experiencing a Mental Health or Behavioral Health Crisis or need someone to talk to.  Lyle Rung, BSW, MSW, LCSW Licensed Clinical Social Worker [employer]   [email or web address] [email or web address] Direct Dial : [phone]

## 2024-01-19 ENCOUNTER — Ambulatory Visit: Admitting: Family Medicine

## 2024-01-19 VITALS — BP 141/58 | HR 74 | Temp 97.7°F | Ht 60.0 in | Wt 193.1 lb

## 2024-01-19 DIAGNOSIS — F419 Anxiety disorder, unspecified: Secondary | ICD-10-CM

## 2024-01-19 DIAGNOSIS — R251 Tremor, unspecified: Secondary | ICD-10-CM

## 2024-01-19 DIAGNOSIS — Z23 Encounter for immunization: Secondary | ICD-10-CM

## 2024-01-19 MED ORDER — BUSPIRONE HCL 5 MG PO TABS: 5.0000 mg | ORAL_TABLET | Freq: Two times a day (BID) | ORAL | 1 refills | Status: DC

## 2024-01-19 NOTE — Assessment & Plan Note (Signed)
 Improved off Gabapentin . Has neuro follow up. Will continue to monitor.

## 2024-01-19 NOTE — Patient Instructions (Addendum)
 Lexapro  10 mg daily for 7-10 days then stop.  Start Buspar.  Follow up in 3 months.

## 2024-01-19 NOTE — Progress Notes (Signed)
 Subjective:  Patient ID: Amy Kelly, female    DOB: 1928/04/07  Age: 89 y.o. MRN: [id number]  CC:   Chief Complaint  Patient presents with   Follow-up    Patient is here for a 1 month follow up from her past tremors.  Patient wants to discuss (lexapro )    HPI:  89 year old female with the below mentioned medical problems presents for follow up.   Tremor has dramatically improved following discontinuation of Gabapentin .  Patient reports that she is very emotional and cries easily. Has had a recent panic attack. Does not feel that Lexapro  is working. Wants to discuss discontinuation and other treatment options today.  She desires her flu vaccine today.  Patient Active Problem List   Diagnosis Date Noted   Tremor 12/21/2023   Frequent falls 12/21/2023   Right arm pain 10/08/2023   Lower extremity edema 08/24/2023   History of breast cancer 08/24/2023   Sensorineural hearing loss, bilateral 02/12/2023   Neuropathy 12/18/2022   Physical deconditioning 12/18/2022   Uncontrolled type 2 diabetes mellitus with hyperglycemia (HCC) 07/07/2022   RLS (restless legs syndrome) 07/07/2022   Chronic diastolic heart failure (HCC) 07/03/2022   Constipation 02/24/2022   Urge incontinence 09/10/2021   CKD (chronic kidney disease), stage IV (HCC) 11/09/2020   HTN (hypertension) 11/08/2020   Hypothyroidism 11/08/2020   Anxiety 11/08/2020   Stroke (HCC) 11/08/2020    Social Hx   Social History   Socioeconomic History   Marital status: Widowed    Spouse name: Not on file   Number of children: Not on file   Years of education: Not on file   Highest education level: 7th grade  Occupational History   Not on file  Tobacco Use   Smoking status: Never    Passive exposure: Never   Smokeless tobacco: Never  Vaping Use   Vaping status: Never Used  Substance and Sexual Activity   Alcohol use: Never   Drug use: Never   Sexual activity: Not Currently    Birth control/protection: None   Other Topics Concern   Not on file  Social History Narrative   Right handed   Social Drivers of Health   Financial Resource Strain: Medium Risk (01/16/2024)   Overall Financial Resource Strain (CARDIA)    Difficulty of Paying Living Expenses: Somewhat hard  Food Insecurity: No Food Insecurity (01/16/2024)   Hunger Vital Sign    Worried About Running Out of Food in the Last Year: Never true    Ran Out of Food in the Last Year: Never true  Transportation Needs: No Transportation Needs (01/16/2024)   PRAPARE - Administrator, Civil Service (Medical): No    Lack of Transportation (Non-Medical): No  Physical Activity: Inactive (01/15/2024)   Exercise Vital Sign    Days of Exercise per Week: 0 days    Minutes of Exercise per Session: Not on file  Stress: Stress Concern Present (01/16/2024)   [employer] of Occupational Health - Occupational Stress Questionnaire    Feeling of Stress: Rather much  Social Connections: Unknown (01/15/2024)   Social Connection and Isolation Panel    Frequency of Communication with Friends and Family: Once a week    Frequency of Social Gatherings with Friends and Family: Never    Attends Religious Services: Never    Database Administrator or Organizations: No    Attends Engineer, Structural: Not on file    Marital Status: Not on file  Recent  Concern: Social Connections - Socially Isolated (01/01/2024)   Social Connection and Isolation Panel    Frequency of Communication with Friends and Family: Three times a week    Frequency of Social Gatherings with Friends and Family: Once a week    Attends Religious Services: Never    Database Administrator or Organizations: No    Attends Banker Meetings: Never    Marital Status: Widowed    Review of Systems Per HPI  Objective:  BP (!) 141/58 (BP Location: Left Arm, Patient Position: Sitting)   Pulse 74   Temp 97.7 F (36.5 C)   Ht 5' (1.524 m)   Wt 193 lb 2 oz  (87.6 kg)   BMI 37.72 kg/m      01/19/2024    2:18 PM 12/18/2023    2:22 PM 10/15/2023    1:42 PM  BP/Weight  Systolic BP 141 139 122  Diastolic BP 58 71 56  Wt. (Lbs) 193.13 203   BMI 37.72 kg/m2 39.65 kg/m2     Physical Exam Vitals and nursing note reviewed.  Constitutional:      General: She is not in acute distress.    Appearance: Normal appearance.  HENT:     Head: Normocephalic and atraumatic.  Cardiovascular:     Rate and Rhythm: Normal rate and regular rhythm.  Pulmonary:     Effort: Pulmonary effort is normal.     Breath sounds: Normal breath sounds.  Neurological:     Mental Status: She is alert.  Psychiatric:        Mood and Affect: Mood normal.        Behavior: Behavior normal.     Lab Results  Component Value Date   WBC 9.4 03/20/2023   HGB 11.8 03/20/2023   HCT 36.6 03/20/2023   PLT 184 03/20/2023   GLUCOSE 191 (H) 08/13/2023   CHOL 120 03/20/2023   TRIG 156 (H) 03/20/2023   HDL 42 03/20/2023   LDLCALC 51 03/20/2023   ALT 18 03/20/2023   AST 18 03/20/2023   NA 140 08/13/2023   K 4.9 08/13/2023   CL 99 08/13/2023   CREATININE 2.26 (H) 08/13/2023   BUN 51 (H) 08/13/2023   CO2 24 08/13/2023   TSH 5.410 (H) 03/20/2023   HGBA1C 7.2 (A) 06/24/2023     Assessment & Plan:  Tremor Assessment & Plan: Improved off Gabapentin . Has neuro follow up. Will continue to monitor.   Immunization due -     Flu vaccine HIGH DOSE PF(Fluzone Trivalent)  Anxiety Assessment & Plan: Uncontrolled and worsening.  Tapering Lexapro . Starting low dose Buspar.   Orders: -     busPIRone HCl; Take 1 tablet (5 mg total) by mouth 2 (two) times daily.  Dispense: 60 tablet; Refill: 1    Follow-up:  3 months  Jeran Hiltz Bluford DO [facility]

## 2024-01-19 NOTE — Assessment & Plan Note (Signed)
 Uncontrolled and worsening.  Tapering Lexapro . Starting low dose Buspar.

## 2024-01-20 ENCOUNTER — Encounter: Payer: Self-pay | Admitting: Endocrinology

## 2024-01-20 ENCOUNTER — Ambulatory Visit: Payer: Self-pay | Admitting: Endocrinology

## 2024-01-20 ENCOUNTER — Ambulatory Visit: Admitting: Endocrinology

## 2024-01-20 VITALS — BP 138/68 | Ht 60.0 in | Wt 193.0 lb

## 2024-01-20 DIAGNOSIS — E1165 Type 2 diabetes mellitus with hyperglycemia: Secondary | ICD-10-CM | POA: Diagnosis not present

## 2024-01-20 DIAGNOSIS — Z794 Long term (current) use of insulin: Secondary | ICD-10-CM

## 2024-01-20 LAB — POCT GLYCOSYLATED HEMOGLOBIN (HGB A1C): Hemoglobin A1C: 6.7 % — AB (ref 4.0–5.6)

## 2024-01-20 MED ORDER — LANTUS SOLOSTAR 100 UNIT/ML ~~LOC~~ SOPN: 20.0000 [IU] | PEN_INJECTOR | Freq: Every day | SUBCUTANEOUS | 4 refills | Status: AC

## 2024-01-20 MED ORDER — FREESTYLE LIBRE 3 PLUS SENSOR MISC: 1.0000 | 3 refills | Status: DC

## 2024-01-20 NOTE — Progress Notes (Signed)
 Outpatient Endocrinology Note Jaris Kohles, MD   Patient's Name: Amy Kelly    DOB: Aug 17, 1928    MRN: [id number]                                                    REASON OF VISIT: Follow-up for type 2 diabetes mellitus  REFERRING PROVIDER: Cook, Jayce G, DO  PCP: Cook, Jayce G, DO  HISTORY OF PRESENT ILLNESS:   Amy Kelly is a 89 y.o. old female with past medical history listed below, is here for follow-up for type 2 diabetes mellitus.   Pertinent Diabetes History: Patient was diagnosed with type 2 diabetes mellitus several years ago, about 20+ years ago.  Patient used to be on metformin and pioglitazone/metformin in the past, probably stopped due to renal insufficiency.  She has been on insulin  therapy started around 2020-2021 timeframe, insulin  dose was adjusted over time.  Patient had hemoglobin A1c of 9.1% consistent with uncontrolled diabetes mellitus and referred to endocrinology for evaluation and management, initial consult in January 2025.  Previous diabetes education: ? Yes   Family h/o diabetes mellitus: Type 2 diabetes.    Chronic Diabetes Complications : Retinopathy: yes. Last ophthalmology exam was done on annually, following with ophthalmology regularly.  Nephropathy: yes, microalbuminuria, CKD IV, on ACE/ARB /valsartan. Following with nephrology.  Peripheral neuropathy: yes, on gabapentin , following neurology.  Following with podiatry. Coronary artery disease: ? Yes, has CHF on furosemide . Stroke: yes  Relevant comorbidities and cardiovascular risk factors: Obesity: yes Body mass index is 37.69 kg/m.  Hypertension: Yes  Hyperlipidemia : Yes, on statin   Current / Home Diabetic regimen includes:  Lantus  25 units daily. Humalog  sliding scale before supper.  Tradjenta  5 mg daily.  Prior diabetic medications: Metformin stopped due to renal insufficiency. Actoplus met in the past.   Glycemic data:    CONTINUOUS GLUCOSE MONITORING SYSTEM (CGMS)  INTERPRETATION:            FreeStyle Libre 3 CGM-  Sensor Download (Sensor download was reviewed and summarized below.) Dates: October 22 to January 20, 2024, 14 days    Impression: - Mostly acceptable blood sugar.  Blood sugar trending down and in the 90 to low 100 range in the early morning.  Occasional hyperglycemia with blood sugar up to 300 range in the evening related to supper and sometimes at bedtime and in the afternoon.  No hypoglycemia.  Hypoglycemia: Patient has no hypoglycemic episodes. Patient has hypoglycemia awareness.  Factors modifying glucose control: 1.  Diabetic diet assessment: 2 meals a day usually.  2.  Staying active or exercising: Limited physical activities.  3.  Medication compliance: compliant all of the time.  # Osteoporosis : managed by PCP.   # Primary hypothyroidism : Currently on levothyroxine  75 mcg daily, levothyroxine  dose was recently increased.  Managed by primary care provider.  Interval history  Hemoglobin A1c 6.7%.  CGM data as reviewed above.  Diabetes regimen as reviewed and noted above.  She has complaints of decreased sensation and numbness and tingling of the feet.  No new complaints.  Patient is accompanied by son in the clinic today.  REVIEW OF SYSTEMS As per history of present illness.   PAST MEDICAL HISTORY: Past Medical History:  Diagnosis Date   Allergy    Anxiety and depression    Arthritis  At risk for falls    Cancer [facility])    Cataract    CHF (congestive heart failure) (HCC)    Chronic kidney disease    Coronary artery disease    Diabetes mellitus (HCC) 11/08/2020   Diabetes mellitus without complication (HCC)    GERD (gastroesophageal reflux disease)    Heart murmur    History of skin cancer    Right shin   HOH (hard of hearing)    Hyperlipidemia    Hypertension    Hypothyroidism    Neuromuscular disorder (HCC)    Osteoporosis    Oxygen deficiency    Stroke (HCC)    Ulcer     PAST SURGICAL  HISTORY: Past Surgical History:  Procedure Laterality Date   ABDOMINAL HYSTERECTOMY     APPENDECTOMY     BREAST SURGERY     CARDIAC CATHETERIZATION     [phone]   CARDIAC CATHETERIZATION     x 2 stents   CARPAL TUNNEL RELEASE Left    CERVICAL SPINE SURGERY     CHOLECYSTECTOMY  1967   DIAGNOSTIC MAMMOGRAM     dnc     ESOPHAGEAL DILATION N/A 04/09/2022   Procedure: ESOPHAGEAL DILATION;  Surgeon: Eartha Angelia Sieving, MD;  [facility];  Service: Gastroenterology;  Laterality: N/A;   ESOPHAGOGASTRODUODENOSCOPY (EGD) WITH PROPOFOL  N/A 04/09/2022   Procedure: ESOPHAGOGASTRODUODENOSCOPY (EGD) WITH PROPOFOL ;  Surgeon: Eartha Angelia Sieving, MD;  Location: [facility];  Service: Gastroenterology;  Laterality: N/A;  2:30pm, asa 3   EYE SURGERY     HERNIA REPAIR     HYSTERECTOMY ABDOMINAL WITH SALPINGO-OOPHORECTOMY  1960   MASTECTOMY Right 2012   SKIN CANCER EXCISION     SPINE SURGERY     THORACIC SPINE SURGERY      ALLERGIES: Allergies  Allergen Reactions   Fesoterodine      Severe dry mouth   Penicillins Diarrhea and Nausea Only   Prednisone Diarrhea and Nausea Only   Solifenacin      Severe dry mouth   Sulfa Antibiotics Diarrhea and Nausea Only    FAMILY HISTORY:  Family History  Problem Relation Age of Onset   Heart disease Father    Cancer Brother     SOCIAL HISTORY: Social History   Socioeconomic History   Marital status: Widowed    Spouse name: Not on file   Number of children: Not on file   Years of education: Not on file   Highest education level: 7th grade  Occupational History   Not on file  Tobacco Use   Smoking status: Never    Passive exposure: Never   Smokeless tobacco: Never  Vaping Use   Vaping status: Never Used  Substance and Sexual Activity   Alcohol use: Never   Drug use: Never   Sexual activity: Not Currently    Birth control/protection: None  Other Topics Concern   Not on file  Social History Narrative    Right handed   Social Drivers of Health   Financial Resource Strain: Medium Risk (01/16/2024)   Overall Financial Resource Strain (CARDIA)    Difficulty of Paying Living Expenses: Somewhat hard  Food Insecurity: No Food Insecurity (01/16/2024)   Hunger Vital Sign    Worried About Running Out of Food in the Last Year: Never true    Ran Out of Food in the Last Year: Never true  Transportation Needs: No Transportation Needs (01/16/2024)   PRAPARE - Administrator, Civil Service (Medical): No  Lack of Transportation (Non-Medical): No  Physical Activity: Inactive (01/15/2024)   Exercise Vital Sign    Days of Exercise per Week: 0 days    Minutes of Exercise per Session: Not on file  Stress: Stress Concern Present (01/16/2024)   [employer] of Occupational Health - Occupational Stress Questionnaire    Feeling of Stress: Rather much  Social Connections: Unknown (01/15/2024)   Social Connection and Isolation Panel    Frequency of Communication with Friends and Family: Once a week    Frequency of Social Gatherings with Friends and Family: Never    Attends Religious Services: Never    Database Administrator or Organizations: No    Attends Engineer, Structural: Not on file    Marital Status: Not on file  Recent Concern: Social Connections - Socially Isolated (01/01/2024)   Social Connection and Isolation Panel    Frequency of Communication with Friends and Family: Three times a week    Frequency of Social Gatherings with Friends and Family: Once a week    Attends Religious Services: Never    Database Administrator or Organizations: No    Attends Banker Meetings: Never    Marital Status: Widowed    MEDICATIONS:  Current Outpatient Medications  Medication Sig Dispense Refill   Ascorbic Acid  (VITAMIN C GUMMIE PO) Take 1,000 mg by mouth in the morning.     B Complex-C (B-COMPLEX WITH VITAMIN C) tablet Take 1 tablet by mouth in the morning.      Blood Glucose Monitoring Suppl DEVI Use up to 4 times daily to check blood sugar. May substitute to any manufacturer covered by patient's insurance. 1 each 0   busPIRone (BUSPAR) 5 MG tablet Take 1 tablet (5 mg total) by mouth 2 (two) times daily. 60 tablet 1   calcitRIOL (ROCALTROL) 0.25 MCG capsule Take 0.25 mcg by mouth every Monday, Wednesday, and Friday.     Calcium -Phosphorus-Vitamin D  (CALCIUM  GUMMIES PO) Take 2 tablets by mouth in the morning.     cephALEXin  (KEFLEX ) 500 MG capsule Take 1 capsule (500 mg total) by mouth 2 (two) times daily. 10 capsule 0   clobetasol (TEMOVATE) 0.05 % external solution Apply 1 Application topically in the morning. Applied to the back of the neck     clopidogrel  (PLAVIX ) 75 MG tablet TAKE 1 TABLET BY MOUTH IN THE MORNING 90 tablet 3   Continuous Glucose Receiver (FREESTYLE LIBRE 3 READER) DEVI Use to check blood sugar continuously. 1 each 0   Continuous Glucose Sensor (FREESTYLE LIBRE 3 PLUS SENSOR) MISC 1 each by Does not apply route continuous. Change every 15 days. 6 each 3   doxazosin  (CARDURA ) 2 MG tablet TAKE 1 TABLET BY MOUTH AT BEDTIME 90 tablet 3   escitalopram  (LEXAPRO ) 20 MG tablet TAKE 1 TABLET BY MOUTH DAILY 90 tablet 1   estradiol  (ESTRACE  VAGINAL) 0.1 MG/GM vaginal cream Place 1 Applicatorful vaginally at bedtime. For 2 weeks, then 3 times a week. 42.5 g 3   famotidine  (PEPCID ) 20 MG tablet Take 1 tablet (20 mg total) by mouth at bedtime. 90 tablet 3   folic acid  (FOLVITE ) 800 MCG tablet Take 800 mcg by mouth in the morning.     furosemide  (LASIX ) 40 MG tablet 2 q am (Patient taking differently: Take 80 mg by mouth daily. 2 q am) 180 tablet 1   hydrALAZINE  (APRESOLINE ) 25 MG tablet Take 1 tablet (25 mg total) by mouth in the morning and at bedtime.  180 tablet 1   insulin  lispro (HUMALOG  KWIKPEN) 100 UNIT/ML KwikPen INJECT 1 TO 10 UNITS UNDER THE SKIN THREE TIMES DAILY BEFORE MEALS PER SLIDING SCALE 30 mL 3   Insulin  Pen Needle (BD PEN NEEDLE  SHORT ULTRAFINE) 31G X 8 MM MISC USE 4 TIMES DAILY FOR ADMINISTRATION OF INSULIN  400 each 0   levothyroxine  (SYNTHROID ) 75 MCG tablet TAKE 1 TABLET BY MOUTH DAILY BEFORE BREAKFAST 90 tablet 1   linagliptin  (TRADJENTA ) 5 MG TABS tablet Take 1 tablet (5 mg total) by mouth daily. 90 tablet 3   loratadine (CLARITIN) 10 MG tablet Take 10 mg by mouth in the morning.     Multiple Vitamin (MULTIVITAMIN WITH MINERALS) TABS tablet Take 1 tablet by mouth in the morning.     nitrofurantoin , macrocrystal-monohydrate, (MACROBID ) 100 MG capsule Take 1 capsule (100 mg total) by mouth every 12 (twelve) hours. 14 capsule 0   Olopatadine HCl (PATADAY OP) Place 1 drop into both eyes in the morning. Once in the mornings in left eye     omeprazole  (PRILOSEC) 40 MG capsule TAKE ONE CAPSULE BY MOUTH DAILY BEFORE BREAKFAST 90 capsule 3   ONETOUCH ULTRA test strip USE UP TO 4 TIMES DAILY TO CHECK BLOOD SUGAR 400 strip 1   polyethylene glycol (MIRALAX  / GLYCOLAX ) 17 g packet Take 17 g by mouth every other day.     potassium chloride  (KLOR-CON ) 10 MEQ tablet Take 1 tablet (10 mEq total) by mouth daily. 90 tablet 3   rOPINIRole  (REQUIP ) 2 MG tablet Take 1 tablet (2 mg total) by mouth at bedtime. 90 tablet 1   rosuvastatin  (CRESTOR ) 40 MG tablet Take 1 tablet (40 mg total) by mouth at bedtime. 90 tablet 3   Vibegron  (GEMTESA ) 75 MG TABS Take 1 tablet (75 mg total) by mouth daily. 30 tablet 11   vitamin B-12 (CYANOCOBALAMIN ) 500 MCG tablet Take 1,000 mcg by mouth in the morning.     insulin  glargine (LANTUS  SOLOSTAR) 100 UNIT/ML Solostar Pen Inject 20 Units into the skin daily. 15 mL 4   No current facility-administered medications for this visit.    PHYSICAL EXAM: Vitals:   01/20/24 1513  BP: 138/68  Weight: 193 lb (87.5 kg)  Height: 5' (1.524 m)    Body mass index is 37.69 kg/m.  Wt Readings from Last 3 Encounters:  01/20/24 193 lb (87.5 kg)  01/19/24 193 lb 2 oz (87.6 kg)  12/18/23 203 lb (92.1 kg)     General: Well developed, well nourished female in no apparent distress.  HEENT: AT/[place], no external lesions.  Eyes: Conjunctiva clear and no icterus. Neck: Neck supple  Lungs: Respirations not labored Neurologic: Alert, oriented, normal speech Extremities / Skin: Dry.  Bilateral pedal edema present. Psychiatric: Does not appear depressed or anxious  Diabetic Foot Exam - Simple   Simple Foot Form Diabetic Foot exam was performed with the following findings: Yes 01/20/2024  3:54 PM  Visual Inspection See comments: Yes Sensation Testing See comments: Yes Pulse Check See comments: Yes Comments Hammertoes deformity present.  Significant diminished monofilament sensory exam bilaterally, absent in bilateral toes.  No ulcer.  Dorsalis pedis palpable bilaterally.    LABS Reviewed Lab Results  Component Value Date   HGBA1C 6.7 (A) 01/20/2024   HGBA1C 7.2 (A) 06/24/2023   HGBA1C 7.7 (H) 03/20/2023   No results found for: FRUCTOSAMINE Lab Results  Component Value Date   CHOL 120 03/20/2023   HDL 42 03/20/2023   LDLCALC 51 03/20/2023  TRIG 156 (H) 03/20/2023   CHOLHDL 2.9 03/20/2023   Lab Results  Component Value Date   MICRALBCREAT 54 (H) 03/20/2023   MICRALBCREAT 56 (H) 09/17/2022   Lab Results  Component Value Date   CREATININE 2.26 (H) 08/13/2023   Lab Results  Component Value Date   GFR 18.21 (L) 10/02/2021    ASSESSMENT / PLAN  1. Type 2 diabetes mellitus with hyperglycemia, with long-term current use of insulin  (HCC)     Diabetes Mellitus type 2, complicated by retinopathy/neuropathy/CKD/PAD. - Diabetic status / severity: Controlled.  Lab Results  Component Value Date   HGBA1C 6.7 (A) 01/20/2024    - Hemoglobin A1c goal : <8%  Diabetes control improving.  - Medications: See below.    She has trending down with some of the times lower normal blood sugar in the early morning.  Would like to decrease basal insulin  as follows to prevent potential  hypoglycemia.  I) continue Tradjenta /linagliptin  5 mg daily. II) decrease Lantus  from 25 to 20 units daily in the morning.   III) Take Humalog  as needed based on sliding scale with before supper as follows.  Mild Sliding Scale Blood Glucose        Insulin  60 -200                   0 units 200-250                 1 units 250-300                 2 units 301-350                 3  units 351-400                 5 units   >400                      6 units and call provider   - Home glucose testing: Continue freestyle libre 3 CGM and check as needed.  - Discussed/ Gave Hypoglycemia treatment plan.  # Consult : not required at this time.   # Annual urine for microalbuminuria/ creatinine ratio, + microalbuminuria currently, continue ACE/ARB valsartan.  She has CKD 4, following with nephrology. Last  Lab Results  Component Value Date   MICRALBCREAT 54 (H) 03/20/2023    # Foot check nightly / neuropathy, continue gabapentin , following with podiatry and also managed by neurology.  # Annual dilated diabetic eye exams.   -She is not interested in diet restriction, which is reasonable.  2. Blood pressure  -  BP Readings from Last 1 Encounters:  01/20/24 138/68    - Control is in target.  - No change in current plans.  3. Lipid status / Hyperlipidemia - Last  Lab Results  Component Value Date   LDLCALC 51 03/20/2023   - Continue rosuvastatin  40 mg daily.  Managed by primary care provider.  Ricky was seen today for diabetes.  Diagnoses and all orders for this visit:  Type 2 diabetes mellitus with hyperglycemia, with long-term current use of insulin  (HCC) -     POCT glycosylated hemoglobin (Hb A1C) -     insulin  glargine (LANTUS  SOLOSTAR) 100 UNIT/ML Solostar Pen; Inject 20 Units into the skin daily. -     Continuous Glucose Sensor (FREESTYLE LIBRE 3 PLUS SENSOR) MISC; 1 each by Does not apply route continuous. Change every 15 days.    DISPOSITION Follow up in  clinic in  4  months suggested.   All questions answered and patient verbalized understanding of the plan.  Andrewjames Weirauch, MD [facility] [address] [place], [address] [place], [place] [address] Phone # [phone]  At least part of this note was generated using voice recognition software. Inadvertent word errors may have occurred, which were not recognized during the proofreading process.

## 2024-01-20 NOTE — Patient Instructions (Addendum)
 Diabetes regimen  Trajdenda 5 mg daily.  Decrease Lantus  to 20 units daily.  Take Humalog  as needed based on sliding scale with before supper as follows.  Mild Sliding Scale Blood Glucose        Insulin  60 -200                   0 units 200-250                 1 units 250-300                 2 units 301-350                 3  units 351-400                 5 units   >400                      6 units and call provider

## 2024-01-21 ENCOUNTER — Other Ambulatory Visit: Payer: Self-pay

## 2024-01-21 DIAGNOSIS — Z556 Problems related to health literacy: Secondary | ICD-10-CM | POA: Diagnosis not present

## 2024-01-21 DIAGNOSIS — Z794 Long term (current) use of insulin: Secondary | ICD-10-CM | POA: Diagnosis not present

## 2024-01-21 DIAGNOSIS — N184 Chronic kidney disease, stage 4 (severe): Secondary | ICD-10-CM | POA: Diagnosis not present

## 2024-01-21 DIAGNOSIS — E1122 Type 2 diabetes mellitus with diabetic chronic kidney disease: Secondary | ICD-10-CM | POA: Diagnosis not present

## 2024-01-21 DIAGNOSIS — N3941 Urge incontinence: Secondary | ICD-10-CM | POA: Diagnosis not present

## 2024-01-21 DIAGNOSIS — E039 Hypothyroidism, unspecified: Secondary | ICD-10-CM | POA: Diagnosis not present

## 2024-01-21 DIAGNOSIS — E1140 Type 2 diabetes mellitus with diabetic neuropathy, unspecified: Secondary | ICD-10-CM | POA: Diagnosis not present

## 2024-01-21 DIAGNOSIS — I5032 Chronic diastolic (congestive) heart failure: Secondary | ICD-10-CM | POA: Diagnosis not present

## 2024-01-21 DIAGNOSIS — I69354 Hemiplegia and hemiparesis following cerebral infarction affecting left non-dominant side: Secondary | ICD-10-CM | POA: Diagnosis not present

## 2024-01-21 DIAGNOSIS — Z9181 History of falling: Secondary | ICD-10-CM | POA: Diagnosis not present

## 2024-01-21 DIAGNOSIS — F419 Anxiety disorder, unspecified: Secondary | ICD-10-CM | POA: Diagnosis not present

## 2024-01-21 DIAGNOSIS — G2581 Restless legs syndrome: Secondary | ICD-10-CM | POA: Diagnosis not present

## 2024-01-21 DIAGNOSIS — Z7902 Long term (current) use of antithrombotics/antiplatelets: Secondary | ICD-10-CM | POA: Diagnosis not present

## 2024-01-21 DIAGNOSIS — H905 Unspecified sensorineural hearing loss: Secondary | ICD-10-CM | POA: Diagnosis not present

## 2024-01-21 DIAGNOSIS — R296 Repeated falls: Secondary | ICD-10-CM | POA: Diagnosis not present

## 2024-01-21 DIAGNOSIS — M25522 Pain in left elbow: Secondary | ICD-10-CM | POA: Diagnosis not present

## 2024-01-21 DIAGNOSIS — Z7984 Long term (current) use of oral hypoglycemic drugs: Secondary | ICD-10-CM | POA: Diagnosis not present

## 2024-01-21 DIAGNOSIS — I130 Hypertensive heart and chronic kidney disease with heart failure and stage 1 through stage 4 chronic kidney disease, or unspecified chronic kidney disease: Secondary | ICD-10-CM | POA: Diagnosis not present

## 2024-01-21 DIAGNOSIS — Z853 Personal history of malignant neoplasm of breast: Secondary | ICD-10-CM | POA: Diagnosis not present

## 2024-01-21 DIAGNOSIS — E1165 Type 2 diabetes mellitus with hyperglycemia: Secondary | ICD-10-CM | POA: Diagnosis not present

## 2024-01-21 NOTE — Patient Outreach (Signed)
 Social Drivers of Health  Community Resource and Care Coordination Visit Note   01/21/2024  Name: Cabria Micalizzi MRN: [id number] DOB:24-Aug-1928  Situation: Referral received for [facility] needs assessment and assistance related to Personal Care. I obtained verbal consent from POA.  Visit completed with [name] on the phone.   Background:   SDOH Interventions Today    Flowsheet Row Most Recent Value  SDOH Interventions   Financial Strain Interventions Other (Comment)  [Can't pay private pay for Home Health.  Continue to obtain [facility] 60 hrs personal care]     Assessment:   Goals Addressed             This Visit's Progress    BSW Goals       Current SDOH Barriers:  Level of care concerns  Interventions: Advised patient to continue to call [facility] weekly for update on provider assignment for personal care. Collaborated with [facility] (community agency) re: Personal care          Recommendation:   Patients son will follow up with [facility] next week on the status of the personal care assignment.  Follow Up Plan:   Telephone follow up appointment date/time:  02/04/24 2pm  Tillman Carolynn HEDWIG [facility]  [facility], [facility] Social Worker Direct Dial : [phone]  Fax: [phone] Website: [email or web address]

## 2024-01-21 NOTE — Patient Instructions (Signed)
 Visit Information  Thank you for taking time to visit with me today. Please don't hesitate to contact me if I can be of assistance to you before our next scheduled appointment.  Your next care management appointment is by telephone on 02/04/24 at 2pm    Please call the care guide team at [phone] if you need to cancel, schedule, or reschedule an appointment.   Please call 911 if you are experiencing a Mental Health or Behavioral Health Crisis or need someone to talk to.  [name], BSW [place]  [facility], [facility] Social Worker Direct Dial : [phone]  Fax: [phone] Website: [email or web address]

## 2024-01-22 DIAGNOSIS — Z7902 Long term (current) use of antithrombotics/antiplatelets: Secondary | ICD-10-CM | POA: Diagnosis not present

## 2024-01-22 DIAGNOSIS — M25522 Pain in left elbow: Secondary | ICD-10-CM | POA: Diagnosis not present

## 2024-01-22 DIAGNOSIS — Z7984 Long term (current) use of oral hypoglycemic drugs: Secondary | ICD-10-CM | POA: Diagnosis not present

## 2024-01-22 DIAGNOSIS — I130 Hypertensive heart and chronic kidney disease with heart failure and stage 1 through stage 4 chronic kidney disease, or unspecified chronic kidney disease: Secondary | ICD-10-CM | POA: Diagnosis not present

## 2024-01-22 DIAGNOSIS — E039 Hypothyroidism, unspecified: Secondary | ICD-10-CM | POA: Diagnosis not present

## 2024-01-22 DIAGNOSIS — G2581 Restless legs syndrome: Secondary | ICD-10-CM | POA: Diagnosis not present

## 2024-01-22 DIAGNOSIS — H905 Unspecified sensorineural hearing loss: Secondary | ICD-10-CM | POA: Diagnosis not present

## 2024-01-22 DIAGNOSIS — N3941 Urge incontinence: Secondary | ICD-10-CM | POA: Diagnosis not present

## 2024-01-22 DIAGNOSIS — N184 Chronic kidney disease, stage 4 (severe): Secondary | ICD-10-CM | POA: Diagnosis not present

## 2024-01-22 DIAGNOSIS — R296 Repeated falls: Secondary | ICD-10-CM | POA: Diagnosis not present

## 2024-01-22 DIAGNOSIS — Z556 Problems related to health literacy: Secondary | ICD-10-CM | POA: Diagnosis not present

## 2024-01-22 DIAGNOSIS — E1140 Type 2 diabetes mellitus with diabetic neuropathy, unspecified: Secondary | ICD-10-CM | POA: Diagnosis not present

## 2024-01-22 DIAGNOSIS — Z9181 History of falling: Secondary | ICD-10-CM | POA: Diagnosis not present

## 2024-01-22 DIAGNOSIS — F419 Anxiety disorder, unspecified: Secondary | ICD-10-CM | POA: Diagnosis not present

## 2024-01-22 DIAGNOSIS — E1165 Type 2 diabetes mellitus with hyperglycemia: Secondary | ICD-10-CM | POA: Diagnosis not present

## 2024-01-22 DIAGNOSIS — E1122 Type 2 diabetes mellitus with diabetic chronic kidney disease: Secondary | ICD-10-CM | POA: Diagnosis not present

## 2024-01-22 DIAGNOSIS — I5032 Chronic diastolic (congestive) heart failure: Secondary | ICD-10-CM | POA: Diagnosis not present

## 2024-01-22 DIAGNOSIS — Z794 Long term (current) use of insulin: Secondary | ICD-10-CM | POA: Diagnosis not present

## 2024-01-22 DIAGNOSIS — Z853 Personal history of malignant neoplasm of breast: Secondary | ICD-10-CM | POA: Diagnosis not present

## 2024-01-22 DIAGNOSIS — I69354 Hemiplegia and hemiparesis following cerebral infarction affecting left non-dominant side: Secondary | ICD-10-CM | POA: Diagnosis not present

## 2024-01-23 ENCOUNTER — Other Ambulatory Visit: Payer: Self-pay | Admitting: *Deleted

## 2024-01-23 ENCOUNTER — Encounter: Payer: Self-pay | Admitting: *Deleted

## 2024-01-23 DIAGNOSIS — Z794 Long term (current) use of insulin: Secondary | ICD-10-CM | POA: Diagnosis not present

## 2024-01-23 DIAGNOSIS — I5032 Chronic diastolic (congestive) heart failure: Secondary | ICD-10-CM | POA: Diagnosis not present

## 2024-01-23 DIAGNOSIS — Z853 Personal history of malignant neoplasm of breast: Secondary | ICD-10-CM | POA: Diagnosis not present

## 2024-01-23 DIAGNOSIS — I69354 Hemiplegia and hemiparesis following cerebral infarction affecting left non-dominant side: Secondary | ICD-10-CM | POA: Diagnosis not present

## 2024-01-23 DIAGNOSIS — E1122 Type 2 diabetes mellitus with diabetic chronic kidney disease: Secondary | ICD-10-CM | POA: Diagnosis not present

## 2024-01-23 DIAGNOSIS — M25522 Pain in left elbow: Secondary | ICD-10-CM | POA: Diagnosis not present

## 2024-01-23 DIAGNOSIS — E1140 Type 2 diabetes mellitus with diabetic neuropathy, unspecified: Secondary | ICD-10-CM | POA: Diagnosis not present

## 2024-01-23 DIAGNOSIS — F419 Anxiety disorder, unspecified: Secondary | ICD-10-CM | POA: Diagnosis not present

## 2024-01-23 DIAGNOSIS — R296 Repeated falls: Secondary | ICD-10-CM | POA: Diagnosis not present

## 2024-01-23 DIAGNOSIS — E039 Hypothyroidism, unspecified: Secondary | ICD-10-CM | POA: Diagnosis not present

## 2024-01-23 DIAGNOSIS — N184 Chronic kidney disease, stage 4 (severe): Secondary | ICD-10-CM | POA: Diagnosis not present

## 2024-01-23 DIAGNOSIS — I130 Hypertensive heart and chronic kidney disease with heart failure and stage 1 through stage 4 chronic kidney disease, or unspecified chronic kidney disease: Secondary | ICD-10-CM | POA: Diagnosis not present

## 2024-01-23 DIAGNOSIS — Z9181 History of falling: Secondary | ICD-10-CM | POA: Diagnosis not present

## 2024-01-23 DIAGNOSIS — Z556 Problems related to health literacy: Secondary | ICD-10-CM | POA: Diagnosis not present

## 2024-01-23 DIAGNOSIS — G2581 Restless legs syndrome: Secondary | ICD-10-CM | POA: Diagnosis not present

## 2024-01-23 DIAGNOSIS — Z7984 Long term (current) use of oral hypoglycemic drugs: Secondary | ICD-10-CM | POA: Diagnosis not present

## 2024-01-23 DIAGNOSIS — Z7902 Long term (current) use of antithrombotics/antiplatelets: Secondary | ICD-10-CM | POA: Diagnosis not present

## 2024-01-23 DIAGNOSIS — N3941 Urge incontinence: Secondary | ICD-10-CM | POA: Diagnosis not present

## 2024-01-23 DIAGNOSIS — E1165 Type 2 diabetes mellitus with hyperglycemia: Secondary | ICD-10-CM | POA: Diagnosis not present

## 2024-01-23 DIAGNOSIS — H905 Unspecified sensorineural hearing loss: Secondary | ICD-10-CM | POA: Diagnosis not present

## 2024-01-23 NOTE — Patient Instructions (Signed)
 Alfonse Jerry - I am sorry I was unable to reach you today for our scheduled appointment. I work with Cook, Jayce G, DO and am calling to support your healthcare needs. Please contact me at [phone] at your earliest convenience. I look forward to speaking with you soon.   Thank you,  Rosina Forte, BSN RN [facility], [facility] Health RN Care Manager Direct Dial : [phone]  Fax: [phone]

## 2024-01-26 ENCOUNTER — Other Ambulatory Visit: Payer: Self-pay

## 2024-01-28 DIAGNOSIS — E1122 Type 2 diabetes mellitus with diabetic chronic kidney disease: Secondary | ICD-10-CM | POA: Diagnosis not present

## 2024-01-28 DIAGNOSIS — I130 Hypertensive heart and chronic kidney disease with heart failure and stage 1 through stage 4 chronic kidney disease, or unspecified chronic kidney disease: Secondary | ICD-10-CM | POA: Diagnosis not present

## 2024-01-28 DIAGNOSIS — I69354 Hemiplegia and hemiparesis following cerebral infarction affecting left non-dominant side: Secondary | ICD-10-CM | POA: Diagnosis not present

## 2024-01-28 DIAGNOSIS — I5032 Chronic diastolic (congestive) heart failure: Secondary | ICD-10-CM | POA: Diagnosis not present

## 2024-01-28 DIAGNOSIS — E1140 Type 2 diabetes mellitus with diabetic neuropathy, unspecified: Secondary | ICD-10-CM | POA: Diagnosis not present

## 2024-01-28 DIAGNOSIS — Z7902 Long term (current) use of antithrombotics/antiplatelets: Secondary | ICD-10-CM | POA: Diagnosis not present

## 2024-01-28 DIAGNOSIS — E1165 Type 2 diabetes mellitus with hyperglycemia: Secondary | ICD-10-CM | POA: Diagnosis not present

## 2024-01-28 DIAGNOSIS — Z9181 History of falling: Secondary | ICD-10-CM | POA: Diagnosis not present

## 2024-01-28 DIAGNOSIS — Z794 Long term (current) use of insulin: Secondary | ICD-10-CM | POA: Diagnosis not present

## 2024-01-28 DIAGNOSIS — E039 Hypothyroidism, unspecified: Secondary | ICD-10-CM | POA: Diagnosis not present

## 2024-01-28 DIAGNOSIS — Z853 Personal history of malignant neoplasm of breast: Secondary | ICD-10-CM | POA: Diagnosis not present

## 2024-01-28 DIAGNOSIS — G2581 Restless legs syndrome: Secondary | ICD-10-CM | POA: Diagnosis not present

## 2024-01-28 DIAGNOSIS — N3941 Urge incontinence: Secondary | ICD-10-CM | POA: Diagnosis not present

## 2024-01-28 DIAGNOSIS — H905 Unspecified sensorineural hearing loss: Secondary | ICD-10-CM | POA: Diagnosis not present

## 2024-01-28 DIAGNOSIS — F419 Anxiety disorder, unspecified: Secondary | ICD-10-CM | POA: Diagnosis not present

## 2024-01-28 DIAGNOSIS — N184 Chronic kidney disease, stage 4 (severe): Secondary | ICD-10-CM | POA: Diagnosis not present

## 2024-01-28 DIAGNOSIS — Z556 Problems related to health literacy: Secondary | ICD-10-CM | POA: Diagnosis not present

## 2024-01-28 DIAGNOSIS — Z7984 Long term (current) use of oral hypoglycemic drugs: Secondary | ICD-10-CM | POA: Diagnosis not present

## 2024-01-28 DIAGNOSIS — R296 Repeated falls: Secondary | ICD-10-CM | POA: Diagnosis not present

## 2024-01-28 DIAGNOSIS — M25522 Pain in left elbow: Secondary | ICD-10-CM | POA: Diagnosis not present

## 2024-01-29 ENCOUNTER — Other Ambulatory Visit: Payer: Self-pay | Admitting: Family Medicine

## 2024-02-03 DIAGNOSIS — Z7984 Long term (current) use of oral hypoglycemic drugs: Secondary | ICD-10-CM | POA: Diagnosis not present

## 2024-02-03 DIAGNOSIS — I5032 Chronic diastolic (congestive) heart failure: Secondary | ICD-10-CM | POA: Diagnosis not present

## 2024-02-03 DIAGNOSIS — Z794 Long term (current) use of insulin: Secondary | ICD-10-CM | POA: Diagnosis not present

## 2024-02-03 DIAGNOSIS — Z556 Problems related to health literacy: Secondary | ICD-10-CM | POA: Diagnosis not present

## 2024-02-03 DIAGNOSIS — E1165 Type 2 diabetes mellitus with hyperglycemia: Secondary | ICD-10-CM | POA: Diagnosis not present

## 2024-02-03 DIAGNOSIS — R296 Repeated falls: Secondary | ICD-10-CM | POA: Diagnosis not present

## 2024-02-03 DIAGNOSIS — Z7902 Long term (current) use of antithrombotics/antiplatelets: Secondary | ICD-10-CM | POA: Diagnosis not present

## 2024-02-03 DIAGNOSIS — N3941 Urge incontinence: Secondary | ICD-10-CM | POA: Diagnosis not present

## 2024-02-03 DIAGNOSIS — G2581 Restless legs syndrome: Secondary | ICD-10-CM | POA: Diagnosis not present

## 2024-02-03 DIAGNOSIS — Z853 Personal history of malignant neoplasm of breast: Secondary | ICD-10-CM | POA: Diagnosis not present

## 2024-02-03 DIAGNOSIS — E039 Hypothyroidism, unspecified: Secondary | ICD-10-CM | POA: Diagnosis not present

## 2024-02-03 DIAGNOSIS — M25522 Pain in left elbow: Secondary | ICD-10-CM | POA: Diagnosis not present

## 2024-02-03 DIAGNOSIS — H905 Unspecified sensorineural hearing loss: Secondary | ICD-10-CM | POA: Diagnosis not present

## 2024-02-03 DIAGNOSIS — F419 Anxiety disorder, unspecified: Secondary | ICD-10-CM | POA: Diagnosis not present

## 2024-02-03 DIAGNOSIS — Z9181 History of falling: Secondary | ICD-10-CM | POA: Diagnosis not present

## 2024-02-03 DIAGNOSIS — E1140 Type 2 diabetes mellitus with diabetic neuropathy, unspecified: Secondary | ICD-10-CM | POA: Diagnosis not present

## 2024-02-03 DIAGNOSIS — I69354 Hemiplegia and hemiparesis following cerebral infarction affecting left non-dominant side: Secondary | ICD-10-CM | POA: Diagnosis not present

## 2024-02-03 DIAGNOSIS — I130 Hypertensive heart and chronic kidney disease with heart failure and stage 1 through stage 4 chronic kidney disease, or unspecified chronic kidney disease: Secondary | ICD-10-CM | POA: Diagnosis not present

## 2024-02-03 DIAGNOSIS — E1122 Type 2 diabetes mellitus with diabetic chronic kidney disease: Secondary | ICD-10-CM | POA: Diagnosis not present

## 2024-02-03 DIAGNOSIS — N184 Chronic kidney disease, stage 4 (severe): Secondary | ICD-10-CM | POA: Diagnosis not present

## 2024-02-04 ENCOUNTER — Other Ambulatory Visit: Payer: Self-pay

## 2024-02-04 NOTE — Patient Instructions (Signed)
 Visit Information  Thank you for taking time to visit with me today. Please don't hesitate to contact me if I can be of assistance to you before our next scheduled appointment.  Your next care management appointment is by telephone on 02/10/24 at 1pm   Please call the care guide team at [phone] if you need to cancel, schedule, or reschedule an appointment.   Please call 911 if you are experiencing a Mental Health or Behavioral Health Crisis or need someone to talk to.  [name], BSW [place]  [facility], [facility] Social Worker Direct Dial : [phone]  Fax: [phone] Website: [email or web address]

## 2024-02-04 NOTE — Patient Outreach (Signed)
 Social Drivers of Health  Community Resource and Care Coordination Visit Note   02/04/2024  Name: Amy Kelly MRN: [id number] DOB:[birth year]  Situation: Referral received for [facility] needs assessment and assistance related to Level of Care. I obtained verbal consent from Caregiver.  Visit completed with Son Amy Kelly on the phone.   Background:      Assessment:   Goals Addressed             This Visit's Progress    BSW Goals       Current SDOH Barriers:  Level of care concerns  Interventions: Advised patient to continue to call [facility] weekly for update on provider assignment for personal care. Collaborated with [facility] (community agency) re: Personal care Son reports [facility] responded but can't find staff for the job and asked for scheduled SW suggests providing a different schedule that may open more options for staff.          Recommendation:   Son to contact [facility] with additional options for schedule.  Follow Up Plan:   Telephone follow up appointment date/time:  02/10/24 at 1pm  Amy Kelly, BSW [name]  [facility], [facility] Social Worker Direct Dial : [phone]  Fax: [phone] Website: [email or web address]

## 2024-02-06 DIAGNOSIS — E211 Secondary hyperparathyroidism, not elsewhere classified: Secondary | ICD-10-CM | POA: Diagnosis not present

## 2024-02-06 DIAGNOSIS — N189 Chronic kidney disease, unspecified: Secondary | ICD-10-CM | POA: Diagnosis not present

## 2024-02-06 DIAGNOSIS — D631 Anemia in chronic kidney disease: Secondary | ICD-10-CM | POA: Diagnosis not present

## 2024-02-06 DIAGNOSIS — R809 Proteinuria, unspecified: Secondary | ICD-10-CM | POA: Diagnosis not present

## 2024-02-10 ENCOUNTER — Other Ambulatory Visit: Payer: Self-pay

## 2024-02-10 ENCOUNTER — Other Ambulatory Visit: Payer: Self-pay | Admitting: Licensed Clinical Social Worker

## 2024-02-10 NOTE — Patient Outreach (Signed)
 Complex Care Management   Visit Note  02/10/2024  Name:  Amy Kelly MRN: [id number] DOB: [birth year]  Situation: Referral received for Complex Care Management related to Mental/Behavioral Health diagnosis dementia/anxiety/panic attacks. I obtained verbal consent from Caregiver.  Visit completed with Caregiver  on the phone  Background:   Past Medical History:  Diagnosis Date   Allergy    Anxiety and depression    Arthritis    At risk for falls    Cancer [facility])    Cataract    CHF (congestive heart failure) (HCC)    Chronic kidney disease    Coronary artery disease    Diabetes mellitus (HCC) 11/08/2020   Diabetes mellitus without complication (HCC)    GERD (gastroesophageal reflux disease)    Heart murmur    History of skin cancer    Right shin   HOH (hard of hearing)    Hyperlipidemia    Hypertension    Hypothyroidism    Neuromuscular disorder (HCC)    Osteoporosis    Oxygen deficiency    Stroke [facility])    Ulcer     Assessment: Patient Reported Symptoms:  Cognitive Cognitive Status: Struggling with memory recall, Requires Assistance Decision Making Cognitive/Intellectual Conditions Management [RPT]: Behavior Disorders Behavior Disorders: Dementia Dx   Health Maintenance Behaviors: Annual physical exam, Stress management Healing Pattern: Average Health Facilitated by: Rest, Stress management  Neurological Neurological Review of Symptoms: Weakness Neurological Management Strategies: Adequate rest, Routine screening Neurological Self-Management Outcome: 4 (good)  HEENT HEENT Symptoms Reported: No symptoms reported HEENT Management Strategies: Routine screening HEENT Self-Management Outcome: 4 (good)    Cardiovascular Cardiovascular Symptoms Reported: Fatigue Does patient have uncontrolled Hypertension?: Yes Is patient checking Blood Pressure at home?: No Cardiovascular Management Strategies: Routine screening, Medication therapy Cardiovascular Self-Management  Outcome: 4 (good)  Respiratory Respiratory Symptoms Reported: Shortness of breath Respiratory Management Strategies: Adequate rest Respiratory Self-Management Outcome: 3 (uncertain)  Endocrine Endocrine Symptoms Reported: No symptoms reported Is patient diabetic?: Yes Endocrine Self-Management Outcome: 4 (good)  Gastrointestinal Gastrointestinal Symptoms Reported: No symptoms reported Gastrointestinal Management Strategies: Adequate rest Gastrointestinal Self-Management Outcome: 4 (good)    Genitourinary Genitourinary Symptoms Reported: No symptoms reported    Integumentary Integumentary Symptoms Reported: No symptoms reported    Musculoskeletal Musculoskelatal Symptoms Reviewed: Unsteady gait, Difficulty walking, Weakness Additional Musculoskeletal Details: HH has only made once visit- PCP updated on new referral for [facility] needed Musculoskeletal Management Strategies: Coping strategies, Routine screening, Medication therapy Musculoskeletal Self-Management Outcome: 3 (uncertain) Falls in the past year?: Yes Number of falls in past year: 1 or less Was there an injury with Fall?: Yes Fall Risk Category Calculator: 2 Patient Fall Risk Level: Moderate Fall Risk    Psychosocial Psychosocial Symptoms Reported: No symptoms reported Additional Psychological Details: PCP started pt on new medication for anxiety/depression that seems to be working. Pt reports alleviation in BH symptoms per caregiver (son) Behavioral Management Strategies: Coping strategies, Medication therapy Behavioral Health Self-Management Outcome: 4 (good) Major Change/Loss/Stressor/Fears (CP): Medical condition, self Techniques to Cope with Loss/Stress/Change: Diversional activities, Medication Quality of Family Relationships: helpful, involved, supportive Do you feel physically threatened by others?: No Brother reports NO panic attacks in over 3 weeks now    02/10/2024    PHQ2-9 Depression Screening   Little interest  or pleasure in doing things More than half the days  Feeling down, depressed, or hopeless More than half the days  PHQ-2 - Total Score 4  Trouble falling or staying asleep, or sleeping too much Nearly every day  Feeling tired or having little energy Nearly every day  Poor appetite or overeating  Several days  Feeling bad about yourself - or that you are a failure or have let yourself or your family down More than half the days  Trouble concentrating on things, such as reading the newspaper or watching television Nearly every day  Moving or speaking so slowly that other people could have noticed.  Or the opposite - being so fidgety or restless that you have been moving around a lot more than usual More than half the days  Thoughts that you would be better off dead, or hurting yourself in some way Not at all  PHQ2-9 Total Score 18  If you checked off any problems, how difficult have these problems made it for you to do your work, take care of things at home, or get along with other people Somewhat difficult  Depression Interventions/Treatment Medication    There were no vitals filed for this visit.    Medications Reviewed Today     Reviewed by Merlynn Lyle CROME, LCSW (Social Worker) on 02/10/24 at 1228  Med List Status: <None>   Medication Order Taking? Sig Documenting Provider Last Dose Status Informant  Ascorbic Acid  (VITAMIN C GUMMIE PO) [id number]  Take 1,000 mg by mouth in the morning. [provider]  Active Family Member  B Complex-C (B-COMPLEX WITH VITAMIN C) tablet [id number]  Take 1 tablet by mouth in the morning. [provider]  Active Family Member  Blood Glucose Monitoring Suppl DEVI [id number]  Use up to 4 times daily to check blood sugar. May substitute to any manufacturer covered by patient's insurance. Cook, Jayce G, DO  Active   busPIRone  (BUSPAR ) 5 MG tablet [id number]  Take 1 tablet (5 mg total) by mouth 2 (two) times daily. Cook, Jayce G, DO  Active    calcitRIOL (ROCALTROL) 0.25 MCG capsule [id number]  Take 0.25 mcg by mouth every Monday, Wednesday, and Friday. [provider]  Active Family Member  Calcium -Phosphorus-Vitamin D  (CALCIUM  GUMMIES PO) [id number]  Take 2 tablets by mouth in the morning. [provider]  Active Family Member  cephALEXin  (KEFLEX ) 500 MG capsule [id number]  Take 1 capsule (500 mg total) by mouth 2 (two) times daily. Matilda Senior, MD  Active   clobetasol (TEMOVATE) 0.05 % external solution [id number]  Apply 1 Application topically in the morning. Applied to the back of the neck [provider]  Active Family Member  clopidogrel  (PLAVIX ) 75 MG tablet [id number]  TAKE 1 TABLET BY MOUTH IN THE MORNING [place], Jayce G, DO  Active   Continuous Glucose Receiver (FREESTYLE LIBRE 3 READER) DEVI [id number]  Use to check blood sugar continuously. Cook, Jayce G, DO  Active   Continuous Glucose Sensor (FREESTYLE LIBRE 3 PLUS SENSOR) [place] [id number]  1 each by Does not apply route continuous. Change every 15 days. Thapa, [place], MD  Active   doxazosin  (CARDURA ) 2 MG tablet [id number]  TAKE 1 TABLET BY MOUTH AT BEDTIME Cook, Jayce G, DO  Active   escitalopram  (LEXAPRO ) 20 MG tablet [id number]  TAKE 1 TABLET BY MOUTH DAILY Cook, Jayce G, DO  Active   estradiol  (ESTRACE  VAGINAL) 0.1 MG/GM vaginal cream [id number]  Place 1 Applicatorful vaginally at bedtime. For 2 weeks, then 3 times a week. Cook, Jayce G, DO  Active   famotidine  (PEPCID ) 20 MG tablet [id number]  Take 1 tablet (20 mg total) by mouth at bedtime. Cook, Jayce G, DO  Active   folic  acid (FOLVITE ) 800 MCG tablet [id number]  Take 800 mcg by mouth in the morning. [provider]  Active Family Member  furosemide  (LASIX ) 40 MG tablet [id number]  TAKE 2 TABLETS BY MOUTH EVERY MORNING Cook, Jayce G, DO  Active   hydrALAZINE  (APRESOLINE ) 25 MG tablet [id number]  Take 1 tablet (25 mg total) by mouth in the morning and at bedtime. Cook, Jayce G, DO  Active    insulin  glargine (LANTUS  SOLOSTAR) 100 UNIT/ML Solostar Pen [id number]  Inject 20 Units into the skin daily. Thapa, [place], MD  Active   insulin  lispro (HUMALOG  KWIKPEN) 100 UNIT/ML KwikPen [id number]  INJECT 1 TO 10 UNITS UNDER THE SKIN THREE TIMES DAILY BEFORE MEALS PER SLIDING SCALE Laie, Jayce G, DO  Active   Insulin  Pen Needle (BD PEN NEEDLE SHORT ULTRAFINE) 31G X 8 MM MISC [id number]  USE 4 TIMES DAILY FOR ADMINISTRATION OF INSULIN  Cook, Jayce G, DO  Active   levothyroxine  (SYNTHROID ) 75 MCG tablet [id number]  TAKE 1 TABLET BY MOUTH DAILY BEFORE BREAKFAST Cook, Jayce G, DO  Active   linagliptin  (TRADJENTA ) 5 MG TABS tablet [id number]  Take 1 tablet (5 mg total) by mouth daily. Thapa, [place], MD  Active   loratadine (CLARITIN) 10 MG tablet [id number]  Take 10 mg by mouth in the morning. [provider]  Active Family Member  Multiple Vitamin (MULTIVITAMIN WITH MINERALS) TABS tablet [id number]  Take 1 tablet by mouth in the morning. [provider]  Active Family Member  nitrofurantoin , macrocrystal-monohydrate, (MACROBID ) 100 MG capsule [id number]  Take 1 capsule (100 mg total) by mouth every 12 (twelve) hours. McKenzie, Belvie CROME, MD  Active   Olopatadine HCl (PATADAY OP) [id number]  Place 1 drop into both eyes in the morning. Once in the mornings in left eye [provider]  Active Family Member  omeprazole  (PRILOSEC) 40 MG capsule [id number]  TAKE ONE CAPSULE BY MOUTH DAILY BEFORE BREAKFAST Cook, Jayce G, DO  Active   ONETOUCH ULTRA test strip [id number]  USE UP TO 4 TIMES DAILY TO CHECK BLOOD SUGAR [place], Jayce G, DO  Active   polyethylene glycol (MIRALAX  / GLYCOLAX ) 17 g packet [id number]  Take 17 g by mouth every other day. [provider]  Active Family Member  potassium chloride  (KLOR-CON ) 10 MEQ tablet [id number]  TAKE 1 TABLET BY MOUTH DAILY. GENERIC EQUIVALENT FOR KLOR-CON  Cook, Jayce G, DO  Active   rOPINIRole  (REQUIP ) 2 MG tablet [id number]  Take 1 tablet (2 mg  total) by mouth at bedtime. Cook, Jayce G, DO  Active   rosuvastatin  (CRESTOR ) 40 MG tablet [id number]  Take 1 tablet (40 mg total) by mouth at bedtime. Cook, Jayce G, DO  Active   Vibegron  (GEMTESA ) 75 MG TABS [id number]  Take 1 tablet (75 mg total) by mouth daily. McKenzie, Belvie CROME, MD  Active   vitamin B-12 (CYANOCOBALAMIN ) 500 MCG tablet [id number]  Take 1,000 mcg by mouth in the morning. [provider]  Active Family Member            Recommendation:   PCP Follow-up Home Health requests: Occupational Therapy Physical therapy Continue Current Plan of Care  Follow Up Plan:   Telephone follow-up in 1 month Referral to RN Case Manager  Lyle Rung, BSW, MSW, LCSW Licensed Clinical Social Worker [employer]   [facility] [email or web address] Direct Dial : [phone]

## 2024-02-10 NOTE — Patient Instructions (Signed)
 Visit Information  Thank you for taking time to visit with me today. Please don't hesitate to contact me if I can be of assistance to you before our next scheduled appointment.  Your next care management appointment is by telephone on 02/24/24 at 1pm   Please call the care guide team at [phone] if you need to cancel, schedule, or reschedule an appointment.   Please call 911 if you are experiencing a Mental Health or Behavioral Health Crisis or need someone to talk to.  [name], BSW [place]  [facility], [facility] Social Worker Direct Dial : [phone]  Fax: [phone] Website: [email or web address]

## 2024-02-10 NOTE — Patient Outreach (Signed)
 Social Drivers of Health  Community Resource and Care Coordination Visit Note   02/10/2024  Name: Mieka Leaton MRN: [id number] DOB:03/27/1928  Situation: Referral received for [facility] needs assessment and assistance related to Level Of Care. I obtained verbal consent from Caregiver.  Visit completed with son Tinie Mcgloin on the phone.   Background:      Assessment:   Goals Addressed             This Visit's Progress    BSW Goals       Current SDOH Barriers:  Level of care concerns  Interventions: Advised patient to continue to call [facility] weekly for update on provider assignment for personal care. Collaborated with [facility] (community agency) re: Personal care Patient son contacted [facility] today to provide a different schedule that may open more options for staff. SW staffed case with LCSW Lyle Rung regarding contacting the provider regarding PT/OT services.  Patient son reports no services from PT after the first day.  [place] Nurse visited last week but has not returned.            Recommendation:   Patient son with await update from [facility] on Home Health.    Follow Up Plan:   Telephone follow up appointment date/time:  02/24/24 apt 1pm  [name], BSW [place]  [facility], [facility] Social Worker Direct Dial : [phone]  Fax: [phone] Website: [email or web address]

## 2024-02-10 NOTE — Patient Instructions (Signed)
 Visit Information  Thank you for taking time to visit with me today. Please don't hesitate to contact me if I can be of assistance to you before our next scheduled appointment.  Our next appointment is by telephone on 03/16/24 at 11 am Please call the care guide team at [phone] if you need to cancel or reschedule your appointment.   Following is a copy of your care plan:   Goals Addressed             This Visit's Progress    LCSW VBCI Social Work Care Plan       Problems:   Cognitive Deficits, Disease Management support and education needs related to Anxiety with Panic Symptoms,, and Level of Care Concerns:Memory Deficits  CSW Clinical Goal(s):   Over the next 60 days the Patient will attend all scheduled medical appointments as evidenced by patient report and care team review of appointment completion in electronic MEDICAL RECORD NUMBERby VBCI team demonstrate a reduction in symptoms related to Caregiver Stress Depression: anxiety  explore community resource options for unmet needs related to Depression  , Food Insecurity , Social Connections, and Stress.  Interventions:  Level of Care Concerns in a patient with Anxiety, DMII, and HTN Current level of care: home, alone, however patient's son [name] lives in the same apartment complex and assist her daily with her needs (and medications.) Evaluation of patient's unmet needs in current living environment ADL's Assessed needs, level of care concerns, how currently meeting needs and barriers to care Discuss community support options (ADTS) Reviewed basic eligibility and provided education on Personal Care Service process, Solution-Focused Strategies employed: other options to improve quality of life and safety within the home Brother recently got a new medical pager from [facility] for $60 to help prevent falls. Patient can easily touch this pager and notify her brother who lives right beside her in the same complex.  PCP updated on  02/10/24 regarding HH needs (pt has only had one visit and family has not heard back since)  Facility  Assessed needs and reviewed facility placement process; as well as the different levels of care Emotional Support Provided Motivational Interviewing employed Family does not want LTC placement at this time  Mental Health:  Evaluation of current treatment plan related to Anxiety with Panic Symptoms, and Caregiver Stress Active listening / Reflection utilized Consideration on in-home help encouraged : options discussed Crisis Resource Education / information provided Depression screen reviewed Discussed referral for psychiatry: Family prefer pt to see neurology first before considering psychiatry even though this was strongly encouraged Emotional Support Provided Participation in support group encouraged : Caregiver Relief Support Groups PHQ2/PHQ9 completed Provided general psycho-education for mental health needs Quality of sleep assessed & Sleep Hygiene techniques promoted Reviewed mental health medications and discussed importance of compliance:   Suicidal Ideation/Homicidal Ideation assessed: No SI/HI, family made aware of 30 resource Referral placed for VBCI RNCM and BSW per family request. BSW has already provided extensive resources on in home support options. Son reports that she has had recent panic attacks, psychiatry referral discussed with family but family wishes to consider this resource first. Son has contacted PCP and left a message regarding her increase in anxiety symptoms. 02/10/24 update- Patient has not had any panic attacks in 3 weeks per brother and new medication that PCP prescribed seems to already be working effectively for patient.   Patient Goals/Self-Care Activities:  Increase coping skills, healthy habits, self-management skills, and stress reduction  Plan:  The care management team will reach out to the patient again over the next 30 days.          Please call the Suicide and Crisis Lifeline: 988 call the [place]  National Suicide Prevention Lifeline: 801 679 3211 or TTY: 4063017859 TTY (503) 370-0018) to talk to a trained counselor call 1-800-273-TALK (toll free, 24 hour hotline) go to [facility] [address] [place] [phone]) call the [facility] Crisis Line: 9522928944 call 911 if you are experiencing a Mental Health or Behavioral Health Crisis or need someone to talk to.  Health Care Power of Attorney brother verbalized understanding of Care plan and visit instructions communicated this visit  Lyle Rung, BSW, MSW, LCSW Licensed Clinical Social Worker [employer]   [facility] [email or web address] Direct Dial : [phone]

## 2024-02-11 DIAGNOSIS — N3941 Urge incontinence: Secondary | ICD-10-CM | POA: Diagnosis not present

## 2024-02-11 DIAGNOSIS — R296 Repeated falls: Secondary | ICD-10-CM | POA: Diagnosis not present

## 2024-02-11 DIAGNOSIS — Z794 Long term (current) use of insulin: Secondary | ICD-10-CM | POA: Diagnosis not present

## 2024-02-11 DIAGNOSIS — Z7902 Long term (current) use of antithrombotics/antiplatelets: Secondary | ICD-10-CM | POA: Diagnosis not present

## 2024-02-11 DIAGNOSIS — E1122 Type 2 diabetes mellitus with diabetic chronic kidney disease: Secondary | ICD-10-CM | POA: Diagnosis not present

## 2024-02-11 DIAGNOSIS — Z7984 Long term (current) use of oral hypoglycemic drugs: Secondary | ICD-10-CM | POA: Diagnosis not present

## 2024-02-11 DIAGNOSIS — I5032 Chronic diastolic (congestive) heart failure: Secondary | ICD-10-CM | POA: Diagnosis not present

## 2024-02-11 DIAGNOSIS — I69354 Hemiplegia and hemiparesis following cerebral infarction affecting left non-dominant side: Secondary | ICD-10-CM | POA: Diagnosis not present

## 2024-02-11 DIAGNOSIS — I130 Hypertensive heart and chronic kidney disease with heart failure and stage 1 through stage 4 chronic kidney disease, or unspecified chronic kidney disease: Secondary | ICD-10-CM | POA: Diagnosis not present

## 2024-02-11 DIAGNOSIS — E1165 Type 2 diabetes mellitus with hyperglycemia: Secondary | ICD-10-CM | POA: Diagnosis not present

## 2024-02-11 DIAGNOSIS — H905 Unspecified sensorineural hearing loss: Secondary | ICD-10-CM | POA: Diagnosis not present

## 2024-02-11 DIAGNOSIS — E039 Hypothyroidism, unspecified: Secondary | ICD-10-CM | POA: Diagnosis not present

## 2024-02-11 DIAGNOSIS — Z556 Problems related to health literacy: Secondary | ICD-10-CM | POA: Diagnosis not present

## 2024-02-11 DIAGNOSIS — Z9181 History of falling: Secondary | ICD-10-CM | POA: Diagnosis not present

## 2024-02-11 DIAGNOSIS — G2581 Restless legs syndrome: Secondary | ICD-10-CM | POA: Diagnosis not present

## 2024-02-11 DIAGNOSIS — Z853 Personal history of malignant neoplasm of breast: Secondary | ICD-10-CM | POA: Diagnosis not present

## 2024-02-11 DIAGNOSIS — M25522 Pain in left elbow: Secondary | ICD-10-CM | POA: Diagnosis not present

## 2024-02-11 DIAGNOSIS — E1140 Type 2 diabetes mellitus with diabetic neuropathy, unspecified: Secondary | ICD-10-CM | POA: Diagnosis not present

## 2024-02-11 DIAGNOSIS — N184 Chronic kidney disease, stage 4 (severe): Secondary | ICD-10-CM | POA: Diagnosis not present

## 2024-02-11 DIAGNOSIS — F419 Anxiety disorder, unspecified: Secondary | ICD-10-CM | POA: Diagnosis not present

## 2024-02-12 ENCOUNTER — Other Ambulatory Visit: Payer: Self-pay | Admitting: Family Medicine

## 2024-02-13 DIAGNOSIS — N184 Chronic kidney disease, stage 4 (severe): Secondary | ICD-10-CM | POA: Diagnosis not present

## 2024-02-13 DIAGNOSIS — R809 Proteinuria, unspecified: Secondary | ICD-10-CM | POA: Diagnosis not present

## 2024-02-13 DIAGNOSIS — N2581 Secondary hyperparathyroidism of renal origin: Secondary | ICD-10-CM | POA: Diagnosis not present

## 2024-02-13 DIAGNOSIS — E1122 Type 2 diabetes mellitus with diabetic chronic kidney disease: Secondary | ICD-10-CM | POA: Diagnosis not present

## 2024-02-16 ENCOUNTER — Other Ambulatory Visit: Payer: Self-pay | Admitting: Family Medicine

## 2024-02-16 ENCOUNTER — Other Ambulatory Visit: Payer: Self-pay | Admitting: Endocrinology

## 2024-02-16 DIAGNOSIS — F419 Anxiety disorder, unspecified: Secondary | ICD-10-CM | POA: Diagnosis not present

## 2024-02-16 DIAGNOSIS — Z556 Problems related to health literacy: Secondary | ICD-10-CM | POA: Diagnosis not present

## 2024-02-16 DIAGNOSIS — Z7984 Long term (current) use of oral hypoglycemic drugs: Secondary | ICD-10-CM | POA: Diagnosis not present

## 2024-02-16 DIAGNOSIS — N3941 Urge incontinence: Secondary | ICD-10-CM | POA: Diagnosis not present

## 2024-02-16 DIAGNOSIS — E039 Hypothyroidism, unspecified: Secondary | ICD-10-CM | POA: Diagnosis not present

## 2024-02-16 DIAGNOSIS — I5032 Chronic diastolic (congestive) heart failure: Secondary | ICD-10-CM | POA: Diagnosis not present

## 2024-02-16 DIAGNOSIS — I130 Hypertensive heart and chronic kidney disease with heart failure and stage 1 through stage 4 chronic kidney disease, or unspecified chronic kidney disease: Secondary | ICD-10-CM | POA: Diagnosis not present

## 2024-02-16 DIAGNOSIS — G2581 Restless legs syndrome: Secondary | ICD-10-CM | POA: Diagnosis not present

## 2024-02-16 DIAGNOSIS — Z853 Personal history of malignant neoplasm of breast: Secondary | ICD-10-CM | POA: Diagnosis not present

## 2024-02-16 DIAGNOSIS — E1140 Type 2 diabetes mellitus with diabetic neuropathy, unspecified: Secondary | ICD-10-CM | POA: Diagnosis not present

## 2024-02-16 DIAGNOSIS — Z7902 Long term (current) use of antithrombotics/antiplatelets: Secondary | ICD-10-CM | POA: Diagnosis not present

## 2024-02-16 DIAGNOSIS — R296 Repeated falls: Secondary | ICD-10-CM | POA: Diagnosis not present

## 2024-02-16 DIAGNOSIS — E1165 Type 2 diabetes mellitus with hyperglycemia: Secondary | ICD-10-CM | POA: Diagnosis not present

## 2024-02-16 DIAGNOSIS — I69354 Hemiplegia and hemiparesis following cerebral infarction affecting left non-dominant side: Secondary | ICD-10-CM | POA: Diagnosis not present

## 2024-02-16 DIAGNOSIS — M25522 Pain in left elbow: Secondary | ICD-10-CM | POA: Diagnosis not present

## 2024-02-16 DIAGNOSIS — Z9181 History of falling: Secondary | ICD-10-CM | POA: Diagnosis not present

## 2024-02-16 DIAGNOSIS — H905 Unspecified sensorineural hearing loss: Secondary | ICD-10-CM | POA: Diagnosis not present

## 2024-02-16 DIAGNOSIS — N184 Chronic kidney disease, stage 4 (severe): Secondary | ICD-10-CM | POA: Diagnosis not present

## 2024-02-16 DIAGNOSIS — E1122 Type 2 diabetes mellitus with diabetic chronic kidney disease: Secondary | ICD-10-CM | POA: Diagnosis not present

## 2024-02-16 DIAGNOSIS — Z794 Long term (current) use of insulin: Secondary | ICD-10-CM | POA: Diagnosis not present

## 2024-02-16 NOTE — Telephone Encounter (Signed)
 Refill request complete

## 2024-02-19 ENCOUNTER — Other Ambulatory Visit: Payer: Self-pay | Admitting: Family Medicine

## 2024-02-19 DIAGNOSIS — F419 Anxiety disorder, unspecified: Secondary | ICD-10-CM | POA: Diagnosis not present

## 2024-02-19 DIAGNOSIS — I10 Essential (primary) hypertension: Secondary | ICD-10-CM

## 2024-02-19 DIAGNOSIS — G2581 Restless legs syndrome: Secondary | ICD-10-CM | POA: Diagnosis not present

## 2024-02-19 DIAGNOSIS — M25522 Pain in left elbow: Secondary | ICD-10-CM | POA: Diagnosis not present

## 2024-02-19 DIAGNOSIS — Z9181 History of falling: Secondary | ICD-10-CM | POA: Diagnosis not present

## 2024-02-19 DIAGNOSIS — I69354 Hemiplegia and hemiparesis following cerebral infarction affecting left non-dominant side: Secondary | ICD-10-CM | POA: Diagnosis not present

## 2024-02-19 DIAGNOSIS — H905 Unspecified sensorineural hearing loss: Secondary | ICD-10-CM | POA: Diagnosis not present

## 2024-02-19 DIAGNOSIS — Z556 Problems related to health literacy: Secondary | ICD-10-CM | POA: Diagnosis not present

## 2024-02-19 DIAGNOSIS — E039 Hypothyroidism, unspecified: Secondary | ICD-10-CM | POA: Diagnosis not present

## 2024-02-19 DIAGNOSIS — N184 Chronic kidney disease, stage 4 (severe): Secondary | ICD-10-CM | POA: Diagnosis not present

## 2024-02-19 DIAGNOSIS — Z853 Personal history of malignant neoplasm of breast: Secondary | ICD-10-CM | POA: Diagnosis not present

## 2024-02-19 DIAGNOSIS — R296 Repeated falls: Secondary | ICD-10-CM | POA: Diagnosis not present

## 2024-02-19 DIAGNOSIS — Z794 Long term (current) use of insulin: Secondary | ICD-10-CM | POA: Diagnosis not present

## 2024-02-19 DIAGNOSIS — I5032 Chronic diastolic (congestive) heart failure: Secondary | ICD-10-CM | POA: Diagnosis not present

## 2024-02-19 DIAGNOSIS — Z7984 Long term (current) use of oral hypoglycemic drugs: Secondary | ICD-10-CM | POA: Diagnosis not present

## 2024-02-19 DIAGNOSIS — I130 Hypertensive heart and chronic kidney disease with heart failure and stage 1 through stage 4 chronic kidney disease, or unspecified chronic kidney disease: Secondary | ICD-10-CM | POA: Diagnosis not present

## 2024-02-19 DIAGNOSIS — Z7902 Long term (current) use of antithrombotics/antiplatelets: Secondary | ICD-10-CM | POA: Diagnosis not present

## 2024-02-19 DIAGNOSIS — N3941 Urge incontinence: Secondary | ICD-10-CM | POA: Diagnosis not present

## 2024-02-19 DIAGNOSIS — E1165 Type 2 diabetes mellitus with hyperglycemia: Secondary | ICD-10-CM | POA: Diagnosis not present

## 2024-02-19 DIAGNOSIS — E1122 Type 2 diabetes mellitus with diabetic chronic kidney disease: Secondary | ICD-10-CM | POA: Diagnosis not present

## 2024-02-19 DIAGNOSIS — E1140 Type 2 diabetes mellitus with diabetic neuropathy, unspecified: Secondary | ICD-10-CM | POA: Diagnosis not present

## 2024-02-19 MED ORDER — HYDRALAZINE HCL 25 MG PO TABS: 25.0000 mg | ORAL_TABLET | Freq: Two times a day (BID) | ORAL | 1 refills | Status: AC

## 2024-02-24 ENCOUNTER — Other Ambulatory Visit: Payer: Self-pay

## 2024-02-24 NOTE — Patient Instructions (Signed)
 Visit Information  Thank you for taking time to visit with me today. Please don't hesitate to contact me if I can be of assistance to you before our next scheduled appointment.  Your next care management appointment is by telephone on 02/27/24 at 11:30am  Please call the care guide team at [phone] if you need to cancel, schedule, or reschedule an appointment.   Please call 911 if you are experiencing a Mental Health or Behavioral Health Crisis or need someone to talk to.  [name], BSW [place]  [facility], [facility] Social Worker Direct Dial : [phone]  Fax: [phone] Website: [email or web address]

## 2024-02-24 NOTE — Patient Outreach (Signed)
 Social Drivers of Health  Community Resource and Care Coordination Visit Note   02/24/2024  Name: Amy Kelly MRN: [id number] DOB:04/29/1928  Situation: Referral received for [facility] needs assessment and assistance related to Personal Care. I obtained verbal consent from Caregiver.  Visit completed with Son Amy Kelly on the phone.   Background:      Assessment:   Goals Addressed             This Visit's Progress    BSW Goals       Current SDOH Barriers:  Level of care concerns  Interventions: Patient interviewed and appropriate screenings performed Patient son is taking patient to appointment and does not have time to call [facility] with SW. Son did not get a reply from [facility] regarding personal care services. Son reports patient has a class tomorrow to start dialysis. Son is not comfortable at this time with patient doing dialysis at home, but will attend the class for more information.            Recommendation:   Patient and son will attend dialysis training class.  SW will conference call [facility] at next appointment.  Follow Up Plan:   Telephone follow up appointment date/time:  02/27/24 at 11:30am  [name], BSW [place]  [facility], [facility] Social Worker Direct Dial : [phone]  Fax: [phone] Website: [email or web address]

## 2024-02-26 ENCOUNTER — Telehealth: Payer: Self-pay | Admitting: *Deleted

## 2024-02-26 ENCOUNTER — Other Ambulatory Visit: Payer: Self-pay | Admitting: Family Medicine

## 2024-02-26 ENCOUNTER — Encounter: Payer: Self-pay | Admitting: *Deleted

## 2024-02-26 NOTE — Patient Instructions (Signed)
 Alfonse Jerry - I am sorry I was unable to reach you today for our scheduled appointment. I work with Cook, Jayce G, DO and am calling to support your healthcare needs. Please contact me at [phone] at your earliest convenience. I look forward to speaking with you soon.   Thank you,  Rosina Forte, BSN RN [facility], [facility] Health RN Care Manager Direct Dial : [phone]  Fax: [phone]

## 2024-02-27 ENCOUNTER — Other Ambulatory Visit: Payer: Self-pay

## 2024-02-27 ENCOUNTER — Other Ambulatory Visit: Payer: Self-pay | Admitting: Family Medicine

## 2024-02-27 DIAGNOSIS — I10 Essential (primary) hypertension: Secondary | ICD-10-CM

## 2024-02-27 NOTE — Patient Instructions (Signed)
 Visit Information  Thank you for taking time to visit with me today. Please don't hesitate to contact me if I can be of assistance to you before our next scheduled appointment.  Your next care management appointment is by telephone on 03/08/24 at 1pm   Please call the care guide team at [phone] if you need to cancel, schedule, or reschedule an appointment.   Please call 911 if you are experiencing a Mental Health or Behavioral Health Crisis or need someone to talk to.  [name], BSW [place]  [facility], [facility] Social Worker Direct Dial : [phone]  Fax: [phone] Website: [email or web address]

## 2024-02-27 NOTE — Patient Outreach (Signed)
 Social Drivers of Health  Community Resource and Care Coordination Visit Note   02/27/2024  Name: Lakiesha Ralphs MRN: [id number] DOB:1929-02-11  Situation: Referral received for [facility] needs assessment and assistance related to Level of care. I obtained verbal consent from Patient.  Visit completed with Patient on the phone.   Background:      Assessment:   Goals Addressed             This Visit's Progress    BSW Goals       Current SDOH Barriers:  Level of care concerns  Interventions: Patient interviewed and appropriate screenings performed Patient son contacted [facility] for personal care today and they have not found staff to assign to patient.  Son plans to call weekly for update. Son reports patient has a class for dialysis went well and he is more comfortable at this time with patient doing dialysis at home.            Recommendation:   Patients son will follow up with [facility] for personal care.  Follow Up Plan:   Telephone follow up appointment date/time:  03/08/24 at 1pm   [name], BSW [place]  [facility], [facility] Social Worker Direct Dial : [phone]  Fax: [phone] Website: [email or web address]

## 2024-03-07 ENCOUNTER — Other Ambulatory Visit: Payer: Self-pay | Admitting: Family Medicine

## 2024-03-07 DIAGNOSIS — F419 Anxiety disorder, unspecified: Secondary | ICD-10-CM

## 2024-03-08 ENCOUNTER — Other Ambulatory Visit: Payer: Self-pay

## 2024-03-08 NOTE — Patient Instructions (Signed)

## 2024-03-08 NOTE — Patient Outreach (Signed)
 Social Drivers of Health  Community Resource and Care Coordination Visit Note   03/08/2024  Name: Amy Kelly MRN: [id number] DOB:1929/02/10  Situation: Referral received for [name] needs assessment and assistance related to Level of Care. I obtained verbal consent from Caregiver.  Visit completed with Amy Kelly on the phone.   Background:      Assessment:   Goals Addressed             This Visit's Progress    COMPLETED: BSW Goals       Current SDOH Barriers:  Level of care concerns  Interventions: Patient interviewed and appropriate screenings performed Patient son contacted [facility] for personal care today and they have not found staff to assign to patient.  Son plans to call weekly for update. Son reports patient should start dialysis at home in January.  Son will report update to [facility]. Son declines to continue visit. Patient is on Meals on Wheels waiting list.            Recommendation:   Son will continue to provide daily care and contact [facility] for status updates on Home Health provider assignment.  Follow Up Plan:   Patient declines further calls or assistance. [name] will be closed. Patient has been provided contact information should new needs arise.   Amy Kelly, BSW [name]  [name], [name] Social Worker Direct Dial : [phone]  Fax: [phone] Website: [email or web address]

## 2024-03-15 ENCOUNTER — Telehealth: Admitting: *Deleted

## 2024-03-16 ENCOUNTER — Other Ambulatory Visit: Payer: Self-pay | Admitting: Licensed Clinical Social Worker

## 2024-03-16 NOTE — Patient Outreach (Signed)
 Complex Care Management   Visit Note  03/16/2024  Name:  Amy Kelly MRN: [id number] DOB: 1928/09/13  Situation: Referral received for Complex Care Management related to Mental/Behavioral Health diagnosis depression/anxiety. I obtained verbal consent from Patient.  Visit completed with Patient  on the phone  Background:   Past Medical History:  Diagnosis Date   Allergy    Anxiety and depression    Arthritis    At risk for falls    Cancer [facility])    Cataract    CHF (congestive heart failure) (HCC)    Chronic kidney disease    Coronary artery disease    Diabetes mellitus (HCC) 11/08/2020   Diabetes mellitus without complication (HCC)    GERD (gastroesophageal reflux disease)    Heart murmur    History of skin cancer    Right shin   HOH (hard of hearing)    Hyperlipidemia    Hypertension    Hypothyroidism    Neuromuscular disorder (HCC)    Osteoporosis    Oxygen deficiency    Stroke [facility])    Ulcer     Assessment: Patient Reported Symptoms:  Cognitive Cognitive Status: Struggling with memory recall, Requires Assistance Decision Making Cognitive/Intellectual Conditions Management [RPT]: Behavior Disorders Behavior Disorders: Dementia Dx   Health Maintenance Behaviors: Annual physical exam, Stress management Healing Pattern: Average Health Facilitated by: Rest, Stress management  Neurological Neurological Review of Symptoms: Weakness Neurological Management Strategies: Adequate rest, Routine screening Neurological Self-Management Outcome: 4 (good)  HEENT HEENT Symptoms Reported: No symptoms reported HEENT Management Strategies: Routine screening HEENT Self-Management Outcome: 4 (good)    Cardiovascular Cardiovascular Symptoms Reported: Fatigue Does patient have uncontrolled Hypertension?: Yes Is patient checking Blood Pressure at home?: No Cardiovascular Management Strategies: Routine screening, Medication therapy Cardiovascular Self-Management Outcome: 4 (good)   Respiratory Respiratory Symptoms Reported: Shortness of breath Respiratory Management Strategies: Adequate rest, Routine screening Respiratory Self-Management Outcome: 3 (uncertain)  Endocrine Endocrine Symptoms Reported: No symptoms reported Is patient diabetic?: Yes Is patient checking blood sugars at home?: No Endocrine Self-Management Outcome: 3 (uncertain)  Gastrointestinal Gastrointestinal Symptoms Reported: No symptoms reported Gastrointestinal Management Strategies: Adequate rest, Coping strategies Gastrointestinal Self-Management Outcome: 4 (good)    Genitourinary Genitourinary Symptoms Reported: No symptoms reported    Integumentary Integumentary Symptoms Reported: Not assessed    Musculoskeletal Musculoskelatal Symptoms Reviewed: Unsteady gait, Difficulty walking, Weakness Musculoskeletal Management Strategies: Coping strategies, Routine screening, Medication therapy Musculoskeletal Self-Management Outcome: 3 (uncertain)      Psychosocial Psychosocial Symptoms Reported: Depression - if selected complete PHQ 2-9 Additional Psychological Details: Family report that new medication Buspar  is having an adverse side effect, PCP updated and family agreeable to call office Behavioral Management Strategies: Coping strategies, Medication therapy Behavioral Health Self-Management Outcome: 4 (good) Major Change/Loss/Stressor/Fears (CP): Medical condition, self Techniques to Cope with Loss/Stress/Change: Diversional activities, Medication Quality of Family Relationships: helpful, involved, supportive Do you feel physically threatened by others?: No    03/16/2024    PHQ2-9 Depression Screening   Little interest or pleasure in doing things More than half the days  Feeling down, depressed, or hopeless Nearly every day  PHQ-2 - Total Score 5  Trouble falling or staying asleep, or sleeping too much Nearly every day  Feeling tired or having little energy Nearly every day  Poor appetite  or overeating  Nearly every day  Feeling bad about yourself - or that you are a failure or have let yourself or your family down More than half the days  Trouble concentrating on things,  such as reading the newspaper or watching television Nearly every day  Moving or speaking so slowly that other people could have noticed.  Or the opposite - being so fidgety or restless that you have been moving around a lot more than usual More than half the days  Thoughts that you would be better off dead, or hurting yourself in some way Not at all  PHQ2-9 Total Score 21  If you checked off any problems, how difficult have these problems made it for you to do your work, take care of things at home, or get along with other people Somewhat difficult  Depression Interventions/Treatment Medication, Patient refuses Treatment   SDOH Interventions    Flowsheet Row Patient Outreach Telephone from 03/16/2024 in [place] POPULATION HEALTH DEPARTMENT Patient Outreach Telephone from 02/10/2024 in [place] POPULATION HEALTH DEPARTMENT Patient Outreach Telephone from 01/21/2024 in [place] POPULATION HEALTH DEPARTMENT Patient Outreach Telephone from 01/16/2024 in [place] POPULATION HEALTH DEPARTMENT Patient Outreach Telephone from 01/09/2024 in [place] POPULATION HEALTH DEPARTMENT Patient Outreach Telephone from 01/01/2024 in  POPULATION HEALTH DEPARTMENT  SDOH Interventions        Food Insecurity Interventions Intervention Not Indicated, Community Resources Provided, Other (Comment)  [On wait list for meals on wheels] Intervention Not Indicated, Other (Comment)  [On wait list for meals on wheels] -- Other (Comment)  [On wait list for Meals on Wheels which is a long wait] Other (Comment)  [On waiting list of Meals on Wheels] --  Housing Interventions Intervention Not Indicated -- -- Intervention Not Indicated Intervention Not Indicated --  Transportation Interventions -- -- -- Intervention Not Indicated  -- Intervention Not Indicated  Utilities Interventions -- -- -- -- -- Intervention Not Indicated  Depression Interventions/Treatment  Medication, Patient refuses Treatment Medication -- Medication, Counseling -- Medication, Counseling  Financial Strain Interventions Other (Comment)  [Can't pay private pay for Home Health. Continue to obtain [facility] 60 hrs personal care] -- Other (Comment)  [Can't pay private pay for Home Health.  Continue to obtain [facility] 60 hrs personal care] [employer] Provided -- Intervention Not Indicated  Stress Interventions [employer] Provided, Provide Counseling, Other (Comment)  [Family was advised to contact PCP office as they continue to deny psychiatry referral] [employer] Provided, Provide Counseling -- [employer], Provide Counseling -- [employer], Provide Counseling  Social Connections Interventions -- -- -- -- -- Programmer, Applications Provided, Intervention Not Indicated  [ADTS and [employer] provided to family]    There were no vitals filed for this visit.    Medications Reviewed Today     Reviewed by Merlynn Lyle CROME, LCSW (Social Worker) on 03/16/24 at 1047  Med List Status: <None>   Medication Order Taking? Sig Documenting Provider Last Dose Status Informant  amLODipine  (NORVASC ) 5 MG tablet [id number]  TAKE 1 TABLET BY MOUTH DAILY Cook, Jayce G, DO  Active   Ascorbic Acid  (VITAMIN C GUMMIE PO) [id number]  Take 1,000 mg by mouth in the morning. [provider]  Active Family Member  B Complex-C (B-COMPLEX WITH VITAMIN C) tablet [id number]  Take 1 tablet by mouth in the morning. [provider]  Active Family Member  Blood Glucose Monitoring Suppl DEVI [id number]  Use up to 4 times daily to check blood sugar. May substitute to any manufacturer covered by patient's insurance. Cook, Jayce G, DO  Active   busPIRone  (BUSPAR ) 5 MG tablet [id number]  TAKE 1 TABLET  BY MOUTH TWICE  DAILY Cook, Jayce G, DO  Active   calcitRIOL (ROCALTROL) 0.25 MCG capsule [id number]  Take 0.25 mcg by mouth every Monday, Wednesday, and Friday. [provider]  Active Family Member  Calcium -Phosphorus-Vitamin D  (CALCIUM  GUMMIES PO) [id number]  Take 2 tablets by mouth in the morning. [provider]  Active Family Member  cephALEXin  (KEFLEX ) 500 MG capsule [id number]  Take 1 capsule (500 mg total) by mouth 2 (two) times daily. Matilda Senior, MD  Active   clobetasol (TEMOVATE) 0.05 % external solution [id number]  Apply 1 Application topically in the morning. Applied to the back of the neck [provider]  Active Family Member  clopidogrel  (PLAVIX ) 75 MG tablet [id number]  TAKE 1 TABLET BY MOUTH IN THE MORNING [place], Jayce G, DO  Active   Continuous Glucose Receiver (FREESTYLE LIBRE 3 READER) DEVI [id number]  Use to check blood sugar continuously. Cook, Jayce G, DO  Active   Continuous Glucose Sensor (FREESTYLE LIBRE 3 PLUS SENSOR) [place] [id number]  1 each by Does not apply route continuous. Change every 15 days. Thapa, [place], MD  Active   doxazosin  (CARDURA ) 2 MG tablet [id number]  TAKE 1 TABLET BY MOUTH AT BEDTIME Cook, Jayce G, DO  Active   escitalopram  (LEXAPRO ) 20 MG tablet [id number]  TAKE 1 TABLET BY MOUTH DAILY [place], Jayce G, DO  Active   estradiol  (ESTRACE  VAGINAL) 0.1 MG/GM vaginal cream [id number]  Place 1 Applicatorful vaginally at bedtime. For 2 weeks, then 3 times a week. Cook, Jayce G, DO  Active   famotidine  (PEPCID ) 20 MG tablet [id number]  Take 1 tablet (20 mg total) by mouth at bedtime. Cook, Jayce G, DO  Active   folic acid  (FOLVITE ) 800 MCG tablet [id number]  Take 800 mcg by mouth in the morning. [provider]  Active Family Member  furosemide  (LASIX ) 40 MG tablet [id number]  TAKE 2 TABLETS BY MOUTH EVERY MORNING Cook, Jayce G, DO  Active   hydrALAZINE  (APRESOLINE ) 25 MG tablet [id number]  Take 1 tablet (25 mg total) by mouth 2 (two) times  daily. Cook, Jayce G, DO  Active   insulin  glargine (LANTUS  SOLOSTAR) 100 UNIT/ML Solostar Pen [id number]  Inject 20 Units into the skin daily. Thapa, [place], MD  Active   insulin  lispro (HUMALOG  KWIKPEN) 100 UNIT/ML KwikPen [id number]  INJECT 1 TO 10 UNITS UNDER THE SKIN THREE TIMES DAILY BEFORE MEALS PER SLIDING SCALE [place], Jayce G, DO  Active   Insulin  Pen Needle (EMBECTA PEN NEEDLE ULTRAFINE) 31G X 8 MM MISC [id number]  USE 4 TIMES DAILY FOR INJECT OF INSULIN  Cook, Jayce G, DO  Active   levothyroxine  (SYNTHROID ) 75 MCG tablet [id number]  TAKE 1 TABLET BY MOUTH DAILY BEFORE BREAKFAST Cook, Jayce G, DO  Active   loratadine (CLARITIN) 10 MG tablet [id number]  Take 10 mg by mouth in the morning. [provider]  Active Family Member  Multiple Vitamin (MULTIVITAMIN WITH MINERALS) TABS tablet [id number]  Take 1 tablet by mouth in the morning. [provider]  Active Family Member  nitrofurantoin , macrocrystal-monohydrate, (MACROBID ) 100 MG capsule [id number]  Take 1 capsule (100 mg total) by mouth every 12 (twelve) hours. McKenzie, Belvie CROME, MD  Active   Olopatadine HCl (PATADAY OP) [id number]  Place 1 drop into both eyes in the morning. Once in the mornings in left eye [provider]  Active Family Member  omeprazole  (PRILOSEC) 40 MG capsule [id number]  TAKE ONE CAPSULE BY MOUTH DAILY BEFORE BREAKFAST Cook, Jayce G, DO  Active  ONETOUCH ULTRA test strip [id number]  USE UP TO 4 TIMES DAILY TO CHECK BLOOD SUGAR [place], Jayce G, DO  Active   polyethylene glycol (MIRALAX  / GLYCOLAX ) 17 g packet [id number]  Take 17 g by mouth every other day. [provider]  Active Family Member  potassium chloride  (KLOR-CON ) 10 MEQ tablet [id number]  TAKE 1 TABLET BY MOUTH DAILY. GENERIC EQUIVALENT FOR KLOR-CON  Cook, Jayce G, DO  Active   rOPINIRole  (REQUIP ) 2 MG tablet [id number]  Take 1 tablet (2 mg total) by mouth at bedtime. Cook, Jayce G, DO  Active   rosuvastatin  (CRESTOR ) 40 MG tablet  [id number]  Take 1 tablet (40 mg total) by mouth at bedtime. Cook, Jayce G, [place]  Active   TRADJENTA  5 MG TABS tablet [id number]  TAKE 1 TABLET BY MOUTH DAILY Thapa, [place], MD  Active   Vibegron  (GEMTESA ) 75 MG TABS [id number]  Take 1 tablet (75 mg total) by mouth daily. McKenzie, Belvie CROME, MD  Active   vitamin B-12 (CYANOCOBALAMIN ) 500 MCG tablet [id number]  Take 1,000 mcg by mouth in the morning. [provider]  Active Family Member            Recommendation:   PCP Follow-up Continue Current Plan of Care  Follow Up Plan:   Telephone follow-up in 1 month  Lyle Rung, BSW, MSW, LCSW Licensed Clinical Social Worker [employer]   [email or web address] Direct Dial : [phone]

## 2024-03-16 NOTE — Patient Instructions (Signed)
 Visit Information  Thank you for taking time to visit with me today. Please don't hesitate to contact me if I can be of assistance to you before our next scheduled appointment.  Our next appointment is by telephone on 04/09/24 at 11 am Please call the care guide team at [phone] if you need to cancel or reschedule your appointment.   Following is a copy of your care plan:   Goals Addressed             This Visit's Progress    LCSW [facility] Social Work Care Plan       Problems:   Cognitive Deficits, Disease Management support and education needs related to Anxiety with Panic Symptoms,, and Level of Care Concerns:Memory Deficits  CSW Clinical Goal(s):   Over the next 60 days the Patient will attend all scheduled medical appointments as evidenced by patient report and care team review of appointment completion in electronic MEDICAL RECORD NUMBERby VBCI team demonstrate a reduction in symptoms related to Caregiver Stress Depression: anxiety  explore community resource options for unmet needs related to Depression  , Food Insecurity , Social Connections, and Stress.  Interventions:  Level of Care Concerns in a patient with Anxiety, DMII, and HTN Current level of care: home, alone, however patient's son [name] lives in the same apartment complex and assist her daily with her needs (and medications.) Evaluation of patient's unmet needs in current living environment ADL's Assessed needs, level of care concerns, how currently meeting needs and barriers to care Discuss community support options (ADTS) Reviewed basic eligibility and provided education on Personal Care Service process, Solution-Focused Strategies employed: other options to improve quality of life and safety within the home Brother recently got a new medical pager from [facility] for $60 to help prevent falls. Patient can easily touch this pager and notify her brother who lives right beside her in the same complex.  PCP updated on  02/10/24 regarding HH needs (pt has only had one visit and family has not heard back since)  Facility  Assessed needs and reviewed facility placement process; as well as the different levels of care Emotional Support Provided Motivational Interviewing employed Family does not want LTC placement at this time  Mental Health:  Evaluation of current treatment plan related to Anxiety with Panic Symptoms, and Caregiver Stress Active listening / Reflection utilized Consideration on in-home help encouraged : options discussed Crisis Resource Education / information provided Depression screen reviewed Discussed referral for psychiatry: Family prefer pt to see neurology first before considering psychiatry even though this was strongly encouraged Emotional Support Provided Participation in support group encouraged : Caregiver Relief Support Groups PHQ2/PHQ9 completed Provided general psycho-education for mental health needs Quality of sleep assessed & Sleep Hygiene techniques promoted Reviewed mental health medications and discussed importance of compliance:   Suicidal Ideation/Homicidal Ideation assessed: No SI/HI, family made aware of 988 resource Referral placed for [facility]I RNCM and BSW per family request. BSW has already provided extensive resources on in home support options-re-referral placed for RNCM due to Stage 5 Kidney Failure Son reports that she has had recent panic attacks, psychiatry referral discussed with family but family wishes to consider this resource first. Son has contacted PCP and left a message regarding her increase in anxiety symptoms. 02/10/24 update- Patient has not had any panic attacks in 3 weeks per brother and new medication that PCP prescribed seems to already be working effectively for patient. 03/16/24- Family report concerns regarding new psychiatry medications and will contact  PCP office today to gain assistance. They were encouraged to schedule an in office appointment  regarding this concern.   Patient Goals/Self-Care Activities:  Increase coping skills, healthy habits, self-management skills, and stress reduction  Plan:   The care management team will reach out to the patient again over the next 30 days.         Please call the Suicide and Crisis Lifeline: 988 call the [place]  National Suicide Prevention Lifeline: 7873488739 or TTY: 5852780362 TTY 684-607-8443) to talk to a trained counselor call 1-800-273-TALK (toll free, 24 hour hotline) go to [facility] [address], [place] [phone]) call the [facility] Crisis Line: 9186631285 call 911 if you are experiencing a Mental Health or Behavioral Health Crisis or need someone to talk to.  Patient verbalized understanding of Care plan and visit instructions communicated this visit  Lyle Rung, BSW, MSW, LCSW Licensed Clinical Social Worker [employer]   [facility] [email or web address] Direct Dial : [phone]

## 2024-03-18 ENCOUNTER — Other Ambulatory Visit: Payer: Self-pay | Admitting: Family Medicine

## 2024-03-24 ENCOUNTER — Encounter: Payer: Self-pay | Admitting: Family Medicine

## 2024-03-24 ENCOUNTER — Ambulatory Visit: Admitting: Family Medicine

## 2024-03-24 DIAGNOSIS — F419 Anxiety disorder, unspecified: Secondary | ICD-10-CM | POA: Diagnosis not present

## 2024-03-24 DIAGNOSIS — R21 Rash and other nonspecific skin eruption: Secondary | ICD-10-CM | POA: Diagnosis not present

## 2024-03-24 DIAGNOSIS — N185 Chronic kidney disease, stage 5: Secondary | ICD-10-CM

## 2024-03-24 DIAGNOSIS — Z6835 Body mass index (BMI) 35.0-35.9, adult: Secondary | ICD-10-CM | POA: Diagnosis not present

## 2024-03-24 DIAGNOSIS — E66811 Obesity, class 1: Secondary | ICD-10-CM

## 2024-03-24 DIAGNOSIS — E785 Hyperlipidemia, unspecified: Secondary | ICD-10-CM

## 2024-03-24 DIAGNOSIS — I10 Essential (primary) hypertension: Secondary | ICD-10-CM

## 2024-03-24 DIAGNOSIS — G629 Polyneuropathy, unspecified: Secondary | ICD-10-CM

## 2024-03-24 DIAGNOSIS — E1169 Type 2 diabetes mellitus with other specified complication: Secondary | ICD-10-CM

## 2024-03-24 DIAGNOSIS — F32A Depression, unspecified: Secondary | ICD-10-CM | POA: Diagnosis not present

## 2024-03-24 DIAGNOSIS — I5032 Chronic diastolic (congestive) heart failure: Secondary | ICD-10-CM | POA: Diagnosis not present

## 2024-03-24 MED ORDER — TRIAMCINOLONE ACETONIDE 0.5 % EX OINT: 1.0000 | TOPICAL_OINTMENT | Freq: Two times a day (BID) | CUTANEOUS | 0 refills | Status: AC

## 2024-03-24 MED ORDER — TRAMADOL HCL 50 MG PO TABS: 50.0000 mg | ORAL_TABLET | Freq: Every evening | ORAL | 0 refills | Status: DC | PRN

## 2024-03-24 MED ORDER — MIRTAZAPINE 7.5 MG PO TABS: 7.5000 mg | ORAL_TABLET | Freq: Every day | ORAL | 1 refills | Status: AC

## 2024-03-24 NOTE — Patient Instructions (Signed)
 Medication as prescribed.  Stop Buspar .  Follow up in 6 weeks.

## 2024-03-25 DIAGNOSIS — R21 Rash and other nonspecific skin eruption: Secondary | ICD-10-CM | POA: Insufficient documentation

## 2024-03-25 DIAGNOSIS — E1169 Type 2 diabetes mellitus with other specified complication: Secondary | ICD-10-CM | POA: Insufficient documentation

## 2024-03-25 DIAGNOSIS — F419 Anxiety disorder, unspecified: Secondary | ICD-10-CM | POA: Insufficient documentation

## 2024-03-25 NOTE — Assessment & Plan Note (Signed)
-   Follows with nephrology

## 2024-03-25 NOTE — Assessment & Plan Note (Signed)
 Topical steroid as prescribed.

## 2024-03-25 NOTE — Assessment & Plan Note (Signed)
 Stable.  Continue current medications.

## 2024-03-25 NOTE — Assessment & Plan Note (Signed)
Stable; euvolemic today.

## 2024-03-25 NOTE — Progress Notes (Signed)
 "  Subjective:  Patient ID: Amy Kelly, female    DOB: 01/03/1929  Age: 89 y.o. MRN: [id number]  CC:   Chief Complaint  Patient presents with   Medication Reaction    Rash on both shoulders since a month into taking Lexapro . Currently on 3rd refill. Family reporting patient is more agitated on lexapro  then previous medication, more depressed and disagreeable. Pt is not eating or sleeping and symptoms are getting worse not better. Family member is wondering if she can take Prozac as it works for him.    HPI:  89 year old female presents for follow-up.  She has been very irritable and agitated.  She is now off Lexapro  and is on BuSpar .  Son and patient would like to discuss medication change today.  She reports a decrease in appetite.  She states that nothing tastes good.  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  I believe that this is contributing to her lack of appetite abnormal taste.  Additionally, she has recently developed a rash of the upper back.  This has been going on for few weeks but she just informed her son.  It is very itchy.  Lastly, she is having pain due to neuropathy.  She was previously on gabapentin  and this was discontinued due to tremor.  She states that it is interfering with her sleep.  It is making her very irritable.  She would like to discuss treatment options today.  Patient Active Problem List   Diagnosis Date Noted   Obesity, morbid (HCC) 03/25/2024   Hyperlipidemia associated with type 2 diabetes mellitus (HCC) 03/25/2024   Anxiety and depression 03/25/2024   Rash 03/25/2024   Frequent falls 12/21/2023   Lower extremity edema 08/24/2023   History of breast cancer 08/24/2023   Sensorineural hearing loss, bilateral 02/12/2023   Neuropathy 12/18/2022   Physical deconditioning 12/18/2022   Uncontrolled type 2 diabetes mellitus with hyperglycemia (HCC) 07/07/2022   RLS (restless legs syndrome) 07/07/2022   Chronic diastolic  heart failure (HCC) 95/82/7975   Constipation 02/24/2022   Urge incontinence 09/10/2021   CKD (chronic kidney disease) stage 5, GFR less than 15 ml/min (HCC) 11/09/2020   HTN (hypertension) 11/08/2020   Hypothyroidism 11/08/2020   Stroke (HCC) 11/08/2020    Social Hx   Social History   Socioeconomic History   Marital status: Widowed    Spouse name: Not on file   Number of children: Not on file   Years of education: Not on file   Highest education level: 7th grade  Occupational History   Not on file  Tobacco Use   Smoking status: Never    Passive exposure: Never   Smokeless tobacco: Never  Vaping Use   Vaping status: Never Used  Substance and Sexual Activity   Alcohol use: Never   Drug use: Never   Sexual activity: Not Currently    Birth control/protection: None  Other Topics Concern   Not on file  Social History Narrative   Right handed   Social Drivers of Health   Tobacco Use: Low Risk (03/24/2024)   Patient History    Smoking Tobacco Use: Never    Smokeless Tobacco Use: Never    Passive Exposure: Never  Financial Resource Strain: Medium Risk (03/16/2024)   Overall Financial Resource Strain (CARDIA)    Difficulty of Paying Living Expenses: Somewhat hard  Food Insecurity: No Food Insecurity (03/16/2024)   Epic    Worried About Radiation Protection Practitioner of Food in the Last  Year: Never true    Ran Out of Food in the Last Year: Never true  Transportation Needs: No Transportation Needs (01/16/2024)   Epic    Lack of Transportation (Medical): No    Lack of Transportation (Non-Medical): No  Physical Activity: Inactive (01/15/2024)   Exercise Vital Sign    Days of Exercise per Week: 0 days    Minutes of Exercise per Session: Not on file  Stress: Stress Concern Present (03/16/2024)   [employer] of Occupational Health - Occupational Stress Questionnaire    Feeling of Stress: Rather much  Social Connections: Unknown (01/15/2024)   Social Connection and Isolation Panel     Frequency of Communication with Friends and Family: Once a week    Frequency of Social Gatherings with Friends and Family: Never    Attends Religious Services: Never    Database Administrator or Organizations: No    Attends Engineer, Structural: Not on file    Marital Status: Not on file  Recent Concern: Social Connections - Socially Isolated (01/01/2024)   Social Connection and Isolation Panel    Frequency of Communication with Friends and Family: Three times a week    Frequency of Social Gatherings with Friends and Family: Once a week    Attends Religious Services: Never    Database Administrator or Organizations: No    Attends Banker Meetings: Never    Marital Status: Widowed  Depression (PHQ2-9): High Risk (03/24/2024)   Depression (PHQ2-9)    PHQ-2 Score: 14  Alcohol Screen: Low Risk (09/04/2023)   Alcohol Screen    Last Alcohol Screening Score (AUDIT): 0  Housing: Low Risk (03/16/2024)   Epic    Unable to Pay for Housing in the Last Year: No    Number of Times Moved in the Last Year: 0    Homeless in the Last Year: No  Recent Concern: Housing - High Risk (01/16/2024)   Epic    Unable to Pay for Housing in the Last Year: Yes    Number of Times Moved in the Last Year: 0    Homeless in the Last Year: No  Utilities: Not At Risk (01/01/2024)   Epic    Threatened with loss of utilities: No  Health Literacy: Adequate Health Literacy (09/05/2023)   B1300 Health Literacy    Frequency of need for help with medical instructions: Never    Review of Systems Per HPI  Objective:  BP 130/72   Pulse 85   Temp 97.7 F (36.5 C)   Ht 5' (1.524 m)   Wt 182 lb (82.6 kg)   SpO2 99%   BMI 35.54 kg/m      03/24/2024    2:05 PM 01/20/2024    3:13 PM 01/19/2024    2:18 PM  BP/Weight  Systolic BP 130 138 141  Diastolic BP 72 68 58  Wt. (Lbs) 182 193 193.13  BMI 35.54 kg/m2 37.69 kg/m2 37.72 kg/m2    Physical Exam Vitals and nursing note reviewed.   Constitutional:      Appearance: Normal appearance. She is obese.  HENT:     Head: Normocephalic and atraumatic.  Cardiovascular:     Rate and Rhythm: Normal rate and regular rhythm.  Pulmonary:     Effort: Pulmonary effort is normal.     Breath sounds: Normal breath sounds. No wheezing or rales.  Neurological:     Mental Status: She is alert.     Lab Results  Component  Value Date   WBC 9.4 03/20/2023   HGB 11.8 03/20/2023   HCT 36.6 03/20/2023   PLT 184 03/20/2023   GLUCOSE 191 (H) 08/13/2023   CHOL 120 03/20/2023   TRIG 156 (H) 03/20/2023   HDL 42 03/20/2023   LDLCALC 51 03/20/2023   ALT 18 03/20/2023   AST 18 03/20/2023   NA 140 08/13/2023   K 4.9 08/13/2023   CL 99 08/13/2023   CREATININE 2.26 (H) 08/13/2023   BUN 51 (H) 08/13/2023   CO2 24 08/13/2023   TSH 5.410 (H) 03/20/2023   HGBA1C 6.7 (A) 01/20/2024     Assessment & Plan:  Neuropathy Assessment & Plan: Tramadol  as directed for pain.  Orders: -     traMADol  HCl; Take 1 tablet (50 mg total) by mouth at bedtime as needed for severe pain (pain score 7-10) or moderate pain (pain score 4-6).  Dispense: 30 tablet; Refill: 0  Obesity, morbid (HCC) Assessment & Plan: Given advanced age, will continue to monitor.   CKD (chronic kidney disease) stage 5, GFR less than 15 ml/min (HCC) Assessment & Plan: Follows with nephrology.   Chronic diastolic heart failure (HCC) Assessment & Plan: Stable/euvolemic today.   Primary hypertension Assessment & Plan: Stable.  Continue current medications.   Anxiety and depression Assessment & Plan: Uncontrolled/worsening given discontinuation of Lexapro .  Starting mirtazapine  which will hopefully help with appetite as well.  Stopping BuSpar .  Orders: -     Mirtazapine ; Take 1 tablet (7.5 mg total) by mouth at bedtime.  Dispense: 90 tablet; Refill: 1  Rash Assessment & Plan: Topical steroid as prescribed.  Orders: -     Triamcinolone  Acetonide; Apply 1  Application topically 2 (two) times daily.  Dispense: 30 g; Refill: 0  Hyperlipidemia associated with type 2 diabetes mellitus (HCC) Assessment & Plan: At goal.      Follow-up: 6 weeks  Sahvannah Rieser Bluford DO [facility] "

## 2024-03-25 NOTE — Assessment & Plan Note (Signed)
 Tramadol  as directed for pain.

## 2024-03-25 NOTE — Assessment & Plan Note (Addendum)
 Uncontrolled/worsening given discontinuation of Lexapro .  Starting mirtazapine  which will hopefully help with appetite as well.  Stopping BuSpar .

## 2024-03-28 NOTE — Assessment & Plan Note (Signed)
"   At goal  "

## 2024-03-28 NOTE — Assessment & Plan Note (Signed)
 Given advanced age, will continue to monitor.

## 2024-03-30 ENCOUNTER — Other Ambulatory Visit: Payer: Self-pay | Admitting: *Deleted

## 2024-03-30 NOTE — Patient Outreach (Addendum)
 Complex Care Management   Visit Note  03/30/2024  Name:  Amy Kelly MRN: [id number] DOB: [birth year]  Situation: Referral received for Complex Care Management related to Chronic Kidney Disease I obtained verbal consent from DPR, [name].  Visit completed with DPR, [name]  on the phone  Background:   Past Medical History:  Diagnosis Date   Allergy    Anxiety and depression    Arthritis    At risk for falls    Cancer [facility])    Cataract    CHF (congestive heart failure) (HCC)    Chronic kidney disease    Coronary artery disease    Diabetes mellitus (HCC) 11/08/2020   Diabetes mellitus without complication (HCC)    GERD (gastroesophageal reflux disease)    Heart murmur    History of skin cancer    Right shin   HOH (hard of hearing)    Hyperlipidemia    Hypertension    Hypothyroidism    Neuromuscular disorder (HCC)    Osteoporosis    Oxygen deficiency    Stroke [facility])    Ulcer     Assessment: Patient Reported Symptoms:  Cognitive Cognitive Status: Struggling with memory recall, Requires Assistance Decision Making Cognitive/Intellectual Conditions Management [RPT]: Behavior Disorders   Health Maintenance Behaviors: Annual physical exam Healing Pattern: Unsure Health Facilitated by: Rest  Neurological Neurological Review of Symptoms: Weakness Neurological Management Strategies: Routine screening Neurological Self-Management Outcome: 3 (uncertain)  HEENT HEENT Symptoms Reported: Ear pain HEENT Management Strategies: Routine screening HEENT Self-Management Outcome: 3 (uncertain)    Cardiovascular Cardiovascular Symptoms Reported: Swelling in legs or feet Is patient checking Blood Pressure at home?: No Cardiovascular Management Strategies: Routine screening Cardiovascular Self-Management Outcome: 3 (uncertain)  Respiratory Respiratory Symptoms Reported: No symptoms reported    Endocrine Endocrine Symptoms Reported: No symptoms reported Is patient diabetic?: Yes Is  patient checking blood sugars at home?: Yes Endocrine Self-Management Outcome: 3 (uncertain)  Gastrointestinal Gastrointestinal Symptoms Reported: Change in appetite Gastrointestinal Management Strategies: Coping strategies Gastrointestinal Self-Management Outcome: 3 (uncertain)    Genitourinary Genitourinary Symptoms Reported: No symptoms reported Genitourinary Self-Management Outcome: 4 (good)  Integumentary Integumentary Symptoms Reported: Rash, Itching Additional Integumentary Details: back Skin Management Strategies: Coping strategies, Routine screening, Medication therapy Skin Self-Management Outcome: 3 (uncertain)  Musculoskeletal Musculoskelatal Symptoms Reviewed: Difficulty walking, Unsteady gait, Back pain, Weakness Musculoskeletal Management Strategies: Coping strategies Musculoskeletal Self-Management Outcome: 3 (uncertain) Falls in the past year?: Yes Number of falls in past year: 2 or more Was there an injury with Fall?: No Fall Risk Category Calculator: 2 Patient Fall Risk Level: Moderate Fall Risk Patient at Risk for Falls Due to: History of fall(s), Impaired balance/gait, Impaired mobility Fall risk Follow up: Education provided, Falls evaluation completed  Psychosocial Psychosocial Symptoms Reported: Depression - if selected complete PHQ 2-9, Anxiety - if selected complete GAD, Sadness - if selected complete PHQ 2-9 Behavioral Management Strategies: Coping strategies, Support system Behavioral Health Self-Management Outcome: 3 (uncertain) Major Change/Loss/Stressor/Fears (CP): Medical condition, self Techniques to Cope with Loss/Stress/Change: Diversional activities Quality of Family Relationships: involved, helpful, supportive Do you feel physically threatened by others?: No    03/30/2024    PHQ2-9 Depression Screening   Little interest or pleasure in doing things Nearly every day  Feeling down, depressed, or hopeless More than half the days  PHQ-2 - Total Score 5   Trouble falling or staying asleep, or sleeping too much More than half the days  Feeling tired or having little energy Nearly every day  Poor appetite or  overeating  Nearly every day  Feeling bad about yourself - or that you are a failure or have let yourself or your family down Several days  Trouble concentrating on things, such as reading the newspaper or watching television More than half the days  Moving or speaking so slowly that other people could have noticed.  Or the opposite - being so fidgety or restless that you have been moving around a lot more than usual Several days  Thoughts that you would be better off dead, or hurting yourself in some way Not at all  PHQ2-9 Total Score 17  If you checked off any problems, how difficult have these problems made it for you to do your work, take care of things at home, or get along with other people Not difficult at all  Depression Interventions/Treatment      There were no vitals filed for this visit.    Medications Reviewed Today     Reviewed by Bertrum Rosina HERO, RN (Registered Nurse) on 03/30/24 at 1022  Med List Status: <None>   Medication Order Taking? Sig Documenting Provider Last Dose Status Informant  amLODipine  (NORVASC ) 5 MG tablet [id number] Yes TAKE 1 TABLET BY MOUTH DAILY Cook, Jayce G, DO  Active   Ascorbic Acid  (VITAMIN C GUMMIE PO) [id number] Yes Take 1,000 mg by mouth in the morning. [provider]  Active Family Member  B Complex-C (B-COMPLEX WITH VITAMIN C) tablet [id number] Yes Take 1 tablet by mouth in the morning. [provider]  Active Family Member  Blood Glucose Monitoring Suppl DEVI [id number] Yes Use up to 4 times daily to check blood sugar. May substitute to any manufacturer covered by patient's insurance. Cook, Jayce G, DO  Active   calcitRIOL (ROCALTROL) 0.25 MCG capsule [id number] Yes Take 0.25 mcg by mouth every Monday, Wednesday, and Friday. [provider]  Active Family Member   Calcium -Phosphorus-Vitamin D  (CALCIUM  GUMMIES PO) [id number] Yes Take 2 tablets by mouth in the morning. [provider]  Active Family Member  clobetasol (TEMOVATE) 0.05 % external solution [id number] Yes Apply 1 Application topically in the morning. Applied to the back of the neck [provider]  Active Family Member  clopidogrel  (PLAVIX ) 75 MG tablet [id number] Yes TAKE 1 TABLET BY MOUTH IN THE MORNING [place], Jayce G, DO  Active   Continuous Glucose Receiver (FREESTYLE LIBRE 3 READER) DEVI [id number] Yes Use to check blood sugar continuously. Cook, Jayce G, DO  Active   Continuous Glucose Sensor (FREESTYLE LIBRE 3 PLUS SENSOR) [place] [id number] Yes 1 each by Does not apply route continuous. Change every 15 days. Thapa, [place], MD  Active   doxazosin  (CARDURA ) 2 MG tablet [id number] Yes TAKE 1 TABLET BY MOUTH AT BEDTIME Cook, Jayce G, DO  Active   famotidine  (PEPCID ) 20 MG tablet [id number] Yes Take 1 tablet (20 mg total) by mouth at bedtime. Cook, Jayce G, DO  Active   furosemide  (LASIX ) 40 MG tablet [id number] Yes TAKE 2 TABLETS BY MOUTH EVERY MORNING Cook, Jayce G, DO  Active   hydrALAZINE  (APRESOLINE ) 25 MG tablet [id number] Yes Take 1 tablet (25 mg total) by mouth 2 (two) times daily. Cook, Jayce G, DO  Active   insulin  glargine (LANTUS  SOLOSTAR) 100 UNIT/ML Solostar Pen [id number] Yes Inject 20 Units into the skin daily. Thapa, [place], MD  Active   insulin  lispro (HUMALOG  KWIKPEN) 100 UNIT/ML KwikPen [id number] Yes INJECT 1 TO 10 UNITS UNDER THE SKIN THREE TIMES DAILY BEFORE MEALS PER SLIDING SCALE  Cook, Jayce G, DO  Active   Insulin  Pen Needle (EMBECTA PEN NEEDLE ULTRAFINE) 31G X 8 MM MISC [id number] Yes USE 4 TIMES DAILY FOR INJECT OF INSULIN  Cook, Jayce G, DO  Active   levothyroxine  (SYNTHROID ) 75 MCG tablet [id number] Yes TAKE 1 TABLET BY MOUTH DAILY BEFORE BREAKFAST Cook, Jayce G, DO  Active   mirtazapine  (REMERON ) 7.5 MG tablet [id number]  Take 1 tablet (7.5 mg total) by mouth at  bedtime. Cook, Jayce G, DO  Active   Multiple Vitamin (MULTIVITAMIN WITH MINERALS) TABS tablet [id number] Yes Take 1 tablet by mouth in the morning. [provider]  Active Family Member  omeprazole  (PRILOSEC) 40 MG capsule [id number] Yes TAKE ONE CAPSULE BY MOUTH DAILY BEFORE BREAKFAST Cook, Jayce G, DO  Active   ONETOUCH ULTRA test strip [id number] Yes USE UP TO 4 TIMES DAILY TO CHECK BLOOD SUGAR Bluford, Jayce G, DO  Active   polyethylene glycol (MIRALAX  / GLYCOLAX ) 17 g packet [id number] Yes Take 17 g by mouth every other day. [provider]  Active Family Member  potassium chloride  (KLOR-CON ) 10 MEQ tablet [id number] Yes TAKE 1 TABLET BY MOUTH DAILY. GENERIC EQUIVALENT FOR KLOR-CON  Cook, Jayce G, DO  Active   rOPINIRole  (REQUIP ) 2 MG tablet [id number] Yes Take 1 tablet (2 mg total) by mouth at bedtime. Cook, Jayce G, [place]  Active   rosuvastatin  (CRESTOR ) 40 MG tablet [id number] Yes TAKE 1 TABLET BY MOUTH AT BEDTIME GENERIC EQUIVALENT FOR CRESTOR  Cook, Jayce G, DO  Active   TRADJENTA  5 MG TABS tablet [id number] Yes TAKE 1 TABLET BY MOUTH DAILY Thapa, [place], MD  Active   traMADol  (ULTRAM ) 50 MG tablet [id number]  Take 1 tablet (50 mg total) by mouth at bedtime as needed for severe pain (pain score 7-10) or moderate pain (pain score 4-6). Cook, Jayce G, DO  Active   triamcinolone  ointment (KENALOG ) 0.5 % [id number]  Apply 1 Application topically 2 (two) times daily. Cook, Jayce G, DO  Active   Vibegron  (GEMTESA ) 75 MG TABS [id number] Yes Take 1 tablet (75 mg total) by mouth daily. McKenzie, Belvie CROME, MD  Active   vitamin B-12 (CYANOCOBALAMIN ) 500 MCG tablet [id number] Yes Take 1,000 mcg by mouth in the morning. [provider]  Active Family Member            Recommendation:   Referral to: Palliative Care - [facility] Continue Current Plan of Care  Follow Up Plan:   Telephone follow-up in 1 month  Rosina Forte, BSN RN [facility], [facility]  Health RN Care Manager Direct Dial : [phone]  Fax: [phone]

## 2024-03-30 NOTE — Patient Instructions (Addendum)
 Visit Information  Thank you for taking time to visit with me today. Please don't hesitate to contact me if I can be of assistance to you before our next scheduled appointment.  Our next appointment is by telephone on 04-13-2024 at 11:00 am Please call the care guide team at [phone] if you need to cancel or reschedule your appointment.   Following is a copy of your care plan:   Goals Addressed             This Visit's Progress    VBCI RN Care Plan - CKD       Problems:  Chronic Disease Management support and education needs related to CKD Stage 5  Goal: Over the next 90 days the Patient will attend all scheduled medical appointments: with primary care provider and specialist as evidenced by keeping all scheduled appointments        continue to work with RN Care Manager and/or Social Worker to address care management and care coordination needs related to CKD Stage 5 as evidenced by adherence to care management team scheduled appointments     take all medications exactly as prescribed and will call provider for medication related questions as evidenced by compliance with all medications    verbalize basic understanding of CKD Stage 5 disease process and self health management plan as evidenced by verbal explanation, recognizing/monitoring symptoms, lifestyle modifications  Interventions:    Chronic Kidney Disease Interventions: Assessed the Designated Party Release [name] understanding of chronic kidney disease    Evaluation of current treatment plan related to chronic kidney disease self management and patient's adherence to plan as established by provider      Provided education to patient re: stroke prevention, s/s of heart attack and stroke    Reviewed medications with patient and discussed importance of compliance    Counseled on adverse effects of illicit drug and excessive alcohol use in patients with chronic kidney disease    Advised patient, providing education and  rationale, to monitor blood pressure daily and record, calling PCP for findings outside established parameters    Discussed complications of poorly controlled blood pressure such as heart disease, stroke, circulatory complications, vision complications, kidney impairment, sexual dysfunction    Reviewed scheduled/upcoming provider appointments including    Discussed plans with patient for ongoing care management follow up and provided patient with direct contact information for care management team    Screening for signs and symptoms of depression related to chronic disease state      Discussed the impact of chronic kidney disease on daily life and mental health and acknowledged and normalized feelings of disempowerment, fear, and frustration    Assessed social determinant of health barriers    Engage patient in early, proactive and ongoing discussion about goals of care and what matters most to them    Support coping and stress management by recognizing current strategies and assist in developing new strategies such as mindfulness, journaling, relaxation techniques, problem-solving    Referral placed with palliative care - [facility]. Spoke with Amy dan plans to meet with NP at Dr. Juanito office. Will follow up prior to next outreach to see plan of care. Last practice recorded BP readings:  BP Readings from Last 3 Encounters:  03/24/24 130/72  01/20/24 138/68  01/19/24 (!) 141/58   Most recent eGFR/CrCl:  Lab Results  Component Value Date   EGFR 20 (L) 08/13/2023    No components found for: CRCL  Patient Self-Care Activities:  Attend all scheduled  provider appointments Call provider office for new concerns or questions  Take medications as prescribed    Plan:  Telephone follow up appointment with care management team member scheduled for:  04-13-2024 at 11:00 am             Please call the Suicide and Crisis Lifeline: 988 call the [place]  National Suicide Prevention  Lifeline: 804-489-4659 or TTY: 916-715-1368 TTY 646 766 9497) to talk to a trained counselor call 1-800-273-TALK (toll free, 24 hour hotline) if you are experiencing a Mental Health or Behavioral Health Crisis or need someone to talk to.  DPR [name] verbalized understanding of Care plan and visit instructions communicated this visit  Amy Kelly, BSN RN [facility], [facility] Health RN Care Manager Direct Dial : [phone]  Fax: [phone]

## 2024-04-05 ENCOUNTER — Telehealth: Payer: Self-pay

## 2024-04-05 ENCOUNTER — Other Ambulatory Visit: Payer: Self-pay | Admitting: Family Medicine

## 2024-04-05 MED ORDER — FUROSEMIDE 40 MG PO TABS: 80.0000 mg | ORAL_TABLET | Freq: Every morning | ORAL | 1 refills | Status: AC

## 2024-04-05 NOTE — Telephone Encounter (Signed)
 Copied from CRM [id number]. Topic: Clinical - Medication Question >> Apr 05, 2024 10:33 AM Sophia H wrote: Reason for CRM: [facility] needing clarification on a script. Please reach out # [phone]  furosemide  (LASIX ) 40 MG tablet

## 2024-04-06 ENCOUNTER — Other Ambulatory Visit: Payer: Self-pay

## 2024-04-06 ENCOUNTER — Other Ambulatory Visit: Payer: Self-pay | Admitting: Family Medicine

## 2024-04-06 ENCOUNTER — Telehealth: Payer: Self-pay

## 2024-04-06 DIAGNOSIS — Z794 Long term (current) use of insulin: Secondary | ICD-10-CM

## 2024-04-06 MED ORDER — PANTOPRAZOLE SODIUM 40 MG PO TBEC: 40.0000 mg | DELAYED_RELEASE_TABLET | Freq: Every day | ORAL | 1 refills | Status: AC

## 2024-04-06 MED ORDER — FREESTYLE LIBRE 3 PLUS SENSOR MISC: 1.0000 | 3 refills | Status: AC

## 2024-04-06 NOTE — Telephone Encounter (Unsigned)
 Copied from CRM [id number]. Topic: Clinical - Medication Refill >> Apr 06, 2024  4:15 PM Winona R wrote: Medication: Prescription for [facility] meter accessories.   Has the patient contacted their pharmacy? Yes (Agent: If no, request that the patient contact the pharmacy for the refill. If patient does not wish to contact the pharmacy document the reason why and proceed with request.) (Agent: If yes, when and what did the pharmacy advise?)  This is the patient's preferred pharmacy:  [employer] - [facility] - [place], [place] - [address] [place] [address] Phone: [phone] Fax: [phone]  Is this the correct pharmacy for this prescription? Yes If no, delete pharmacy and type the correct one.   Has the prescription been filled recently? Yes  Is the patient out of the medication? No, she has some left   Has the patient been seen for an appointment in the last year OR does the patient have an upcoming appointment? Yes  Can we respond through MyChart? Yes  Agent: Please be advised that Rx refills may take up to 3 business days. We ask that you follow-up with your pharmacy.   ----------------------------------------------------------------------- From previous Reason for Contact - Prescription Issue: Prescription for [employer] meter accessories.

## 2024-04-06 NOTE — Telephone Encounter (Signed)
 Spoke with Ms Heikes's son and informed him per provider notes about medications interactions and instructions to stop omeprazole , he states she has been taking all these medications for 2 to 3 yrs and has not had any problems. He states he is unsure why they are changing medications now after all this time, he states will contact [facility] pharmacy and let us  know if anything else is needed.

## 2024-04-06 NOTE — Telephone Encounter (Signed)
 [facility] - left voiemail

## 2024-04-06 NOTE — Telephone Encounter (Signed)
 Copied from CRM #[id number]. Topic: Clinical - Prescription Issue >> Apr 05, 2024  4:52 PM Zebedee SAUNDERS wrote: Reason for CRM: Received call from [email or web address] - [facility] - [address] [place] [address] Phone: [phone] Fax: [phone], medication interaction furosemide  (LASIX ) 40 MG tablet, clopidogrel  (PLAVIX ) 75 MG tablet and omeprazole  (PRILOSEC) 40 MG capsule. Pt allergic to sulfur, and drug interaction. Please call pharmacy.

## 2024-04-07 NOTE — Telephone Encounter (Signed)
 Copied from CRM #[id number]. Topic: Clinical - Prescription Issue >> Apr 06, 2024  4:42 PM Hadassah PARAS wrote: Reason for CRM: Pt's son [name] would like to know why medications were changed from omeprazole  (PRILOSEC) 40 MG capsule to pantoprazole  (PROTONIX ) 40 MG tablet. [facility] is wanting confirmation from pt's son. Please advise son on #[phone]

## 2024-04-09 ENCOUNTER — Other Ambulatory Visit: Payer: Self-pay | Admitting: Licensed Clinical Social Worker

## 2024-04-09 NOTE — Patient Outreach (Signed)
 Complex Care Management   Visit Note  04/09/2024  Name:  Amy Kelly MRN: [id number] DOB: [birth year]  Situation: Referral received for Complex Care Management related to Mental/Behavioral Health diagnosis dementia. I obtained verbal consent from Patient.  Visit completed with Patient  on the phone  Background:   Past Medical History:  Diagnosis Date   Allergy    Anxiety and depression    Arthritis    At risk for falls    Cancer [facility])    Cataract    CHF (congestive heart failure) (HCC)    Chronic kidney disease    Coronary artery disease    Diabetes mellitus (HCC) 11/08/2020   Diabetes mellitus without complication (HCC)    GERD (gastroesophageal reflux disease)    Heart murmur    History of skin cancer    Right shin   HOH (hard of hearing)    Hyperlipidemia    Hypertension    Hypothyroidism    Neuromuscular disorder (HCC)    Osteoporosis    Oxygen deficiency    Stroke (HCC)    Ulcer     Assessment: Patient Reported Symptoms:  Cognitive Cognitive Status: Struggling with memory recall, Requires Assistance Decision Making Cognitive/Intellectual Conditions Management [RPT]: Behavior Disorders Behavior Disorders: Dementia Dx   Health Maintenance Behaviors: Annual physical exam Healing Pattern: Slow Health Facilitated by: Rest  Neurological Neurological Review of Symptoms: Weakness Neurological Management Strategies: Routine screening Neurological Self-Management Outcome: 3 (uncertain)  HEENT HEENT Symptoms Reported: Ear pain HEENT Management Strategies: Routine screening HEENT Self-Management Outcome: 3 (uncertain)    Cardiovascular Cardiovascular Symptoms Reported: Swelling in legs or feet Does patient have uncontrolled Hypertension?: No Is patient checking Blood Pressure at home?: No Cardiovascular Management Strategies: Routine screening Cardiovascular Self-Management Outcome: 3 (uncertain)  Respiratory Respiratory Symptoms Reported: No symptoms  reported Respiratory Management Strategies: Adequate rest, Routine screening Respiratory Self-Management Outcome: 4 (good)  Endocrine Endocrine Symptoms Reported: No symptoms reported Is patient diabetic?: Yes Is patient checking blood sugars at home?: Yes Endocrine Self-Management Outcome: 3 (uncertain)  Gastrointestinal Gastrointestinal Symptoms Reported: Change in appetite Gastrointestinal Management Strategies: Coping strategies Gastrointestinal Self-Management Outcome: 3 (uncertain)    Genitourinary Genitourinary Symptoms Reported: No symptoms reported Genitourinary Self-Management Outcome: 4 (good)  Integumentary Integumentary Symptoms Reported: Itching, Rash Skin Management Strategies: Coping strategies, Routine screening, Medication therapy Skin Self-Management Outcome: 3 (uncertain)  Musculoskeletal Musculoskelatal Symptoms Reviewed: Difficulty walking, Unsteady gait, Weakness Musculoskeletal Management Strategies: Coping strategies Musculoskeletal Self-Management Outcome: 3 (uncertain) Number of falls in past year: 2 or more Was there an injury with Fall?: No    Psychosocial Psychosocial Symptoms Reported: Depression - if selected complete PHQ 2-9 Behavioral Management Strategies: Coping strategies, Medication therapy, Support system Behavioral Health Self-Management Outcome: 3 (uncertain) Major Change/Loss/Stressor/Fears (CP): Medical condition, self Techniques to Cope with Loss/Stress/Change: Diversional activities, Medication Quality of Family Relationships: involved, helpful, supportive Do you feel physically threatened by others?: No    04/09/2024    PHQ2-9 Depression Screening   Little interest or pleasure in doing things Nearly every day  Feeling down, depressed, or hopeless Nearly every day  PHQ-2 - Total Score 6  Trouble falling or staying asleep, or sleeping too much More than half the days  Feeling tired or having little energy Nearly every day  Poor appetite  or overeating  Nearly every day  Feeling bad about yourself - or that you are a failure or have let yourself or your family down Several days  Trouble concentrating on things, such as reading the newspaper or  watching television More than half the days  Moving or speaking so slowly that other people could have noticed.  Or the opposite - being so fidgety or restless that you have been moving around a lot more than usual Several days  Thoughts that you would be better off dead, or hurting yourself in some way Not at all  PHQ2-9 Total Score 18  If you checked off any problems, how difficult have these problems made it for you to do your work, take care of things at home, or get along with other people    Depression Interventions/Treatment Medication, Community Resources Provided, Patient refuses Treatment    There were no vitals filed for this visit.    Medications Reviewed Today     Reviewed by Merlynn Lyle CROME, LCSW (Social Worker) on 04/09/24 at 1300  Med List Status: <None>   Medication Order Taking? Sig Documenting Provider Last Dose Status Informant  amLODipine  (NORVASC ) 5 MG tablet [id number]  TAKE 1 TABLET BY MOUTH DAILY Cook, Jayce G, DO  Active   Ascorbic Acid  (VITAMIN C GUMMIE PO) [id number]  Take 1,000 mg by mouth in the morning. [provider]  Active Family Member  B Complex-C (B-COMPLEX WITH VITAMIN C) tablet [id number]  Take 1 tablet by mouth in the morning. [provider]  Active Family Member  Blood Glucose Monitoring Suppl DEVI [id number]  Use up to 4 times daily to check blood sugar. May substitute to any manufacturer covered by patient's insurance. Cook, Jayce G, DO  Active   calcitRIOL (ROCALTROL) 0.25 MCG capsule [id number]  Take 0.25 mcg by mouth every Monday, Wednesday, and Friday. [provider]  Active Family Member  Calcium -Phosphorus-Vitamin D  (CALCIUM  GUMMIES PO) [id number]  Take 2 tablets by mouth in the morning. [provider]   Active Family Member  clobetasol (TEMOVATE) 0.05 % external solution [id number]  Apply 1 Application topically in the morning. Applied to the back of the neck [provider]  Active Family Member  clopidogrel  (PLAVIX ) 75 MG tablet [id number]  TAKE 1 TABLET BY MOUTH IN THE MORNING [place], Jayce G, DO  Active   Continuous Glucose Receiver (FREESTYLE LIBRE 3 READER) DEVI [id number]  Use to check blood sugar continuously. Cook, Jayce G, DO  Active   Continuous Glucose Sensor (FREESTYLE LIBRE 3 PLUS SENSOR) [place] [id number]  1 each by Does not apply route continuous. Change every 15 days. Cook, Jayce G, DO  Active   doxazosin  (CARDURA ) 2 MG tablet [id number]  TAKE 1 TABLET BY MOUTH AT BEDTIME Cook, Jayce G, DO  Active   famotidine  (PEPCID ) 20 MG tablet [id number]  Take 1 tablet (20 mg total) by mouth at bedtime. Cook, Jayce G, DO  Active   furosemide  (LASIX ) 40 MG tablet [id number]  Take 2 tablets (80 mg total) by mouth every morning. Cook, Jayce G, DO  Active   hydrALAZINE  (APRESOLINE ) 25 MG tablet [id number]  Take 1 tablet (25 mg total) by mouth 2 (two) times daily. Cook, Jayce G, DO  Active   insulin  glargine (LANTUS  SOLOSTAR) 100 UNIT/ML Solostar Pen [id number]  Inject 20 Units into the skin daily. Thapa, [place], MD  Active   insulin  lispro (HUMALOG  KWIKPEN) 100 UNIT/ML KwikPen [id number]  INJECT 1 TO 10 UNITS UNDER THE SKIN THREE TIMES DAILY BEFORE MEALS PER SLIDING SCALE [place], Jayce G, DO  Active   Insulin  Pen Needle (EMBECTA PEN NEEDLE ULTRAFINE) 31G X 8 MM MISC [id number]  USE 4 TIMES DAILY FOR INJECT OF INSULIN  [place],  Jayce G, DO  Active   levothyroxine  (SYNTHROID ) 75 MCG tablet [id number]  TAKE 1 TABLET BY MOUTH DAILY BEFORE BREAKFAST Cook, Jayce G, DO  Active   mirtazapine  (REMERON ) 7.5 MG tablet [id number]  Take 1 tablet (7.5 mg total) by mouth at bedtime. Cook, Jayce G, DO  Active   Multiple Vitamin (MULTIVITAMIN WITH MINERALS) TABS tablet [id number]  Take 1 tablet by mouth in the morning. [provider]  Active Family Member  [facility] ULTRA test strip [id number]  USE UP TO 4 TIMES DAILY TO CHECK BLOOD SUGAR Cook, Jayce G, DO  Active   pantoprazole  (PROTONIX ) 40 MG tablet [id number]  Take 1 tablet (40 mg total) by mouth daily. Cook, Jayce G, DO  Active   polyethylene glycol (MIRALAX  / GLYCOLAX ) 17 g packet [id number]  Take 17 g by mouth every other day. [provider]  Active Family Member  potassium chloride  (KLOR-CON ) 10 MEQ tablet [id number]  TAKE 1 TABLET BY MOUTH DAILY. GENERIC EQUIVALENT FOR KLOR-CON  [place], Jayce G, [place]  Active   rOPINIRole  (REQUIP ) 2 MG tablet [id number]  Take 1 tablet (2 mg total) by mouth at bedtime. Cook, Jayce G, DO  Active   rosuvastatin  (CRESTOR ) 40 MG tablet [id number]  TAKE 1 TABLET BY MOUTH AT BEDTIME GENERIC EQUIVALENT FOR CRESTOR  Cook, Jayce G, DO  Active   TRADJENTA  5 MG TABS tablet [id number]  TAKE 1 TABLET BY MOUTH DAILY Thapa, [place], MD  Active   traMADol  (ULTRAM ) 50 MG tablet [id number]  Take 1 tablet (50 mg total) by mouth at bedtime as needed for severe pain (pain score 7-10) or moderate pain (pain score 4-6). Cook, Jayce G, DO  Active   triamcinolone  ointment (KENALOG ) 0.5 % [id number]  Apply 1 Application topically 2 (two) times daily. Cook, Jayce G, DO  Active   Vibegron  (GEMTESA ) 75 MG TABS [id number]  Take 1 tablet (75 mg total) by mouth daily. McKenzie, Belvie CROME, MD  Active   vitamin B-12 (CYANOCOBALAMIN ) 500 MCG tablet [id number]  Take 1,000 mcg by mouth in the morning. [provider]  Active Family Member            Recommendation:   PCP Follow-up  Follow Up Plan:   Telephone follow-up in 1 month  Lyle Rung, BSW, MSW, LCSW Licensed Clinical Social Worker [employer]   [facility] [email or web address] Direct Dial : [phone]

## 2024-04-09 NOTE — Patient Instructions (Signed)
 Visit Information  Thank you for taking time to visit with me today. Please don't hesitate to contact me if I can be of assistance to you before our next scheduled appointment.  Our next appointment is by telephone on 04/30/24 at 3 Please call the care guide team at [phone] if you need to cancel or reschedule your appointment.   Following is a copy of your care plan:   Goals Addressed             This Visit's Progress    LCSW VBCI Social Work Care Plan       Problems:   Cognitive Deficits, Disease Management support and education needs related to Anxiety with Panic Symptoms,, and Level of Care Concerns:Memory Deficits  CSW Clinical Goal(s):   Over the next 60 days the Patient will attend all scheduled medical appointments as evidenced by patient report and care team review of appointment completion in electronic MEDICAL RECORD NUMBERby VBCI team demonstrate a reduction in symptoms related to Caregiver Stress Depression: anxiety  explore community resource options for unmet needs related to Depression  , Food Insecurity , Social Connections, and Stress.  Interventions:  Level of Care Concerns in a patient with Anxiety, DMII, and HTN Current level of care: home, alone, however patient's son [name] lives in the same apartment complex and assist her daily with her needs (and medications.) Evaluation of patient's unmet needs in current living environment ADL's Assessed needs, level of care concerns, how currently meeting needs and barriers to care Discuss community support options (ADTS) Reviewed basic eligibility and provided education on Personal Care Service process, Solution-Focused Strategies employed: other options to improve quality of life and safety within the home Brother recently got a new medical pager from [facility] for $60 to help prevent falls. Patient can easily touch this pager and notify her brother who lives right beside her in the same complex.  PCP updated on  02/10/24 regarding HH needs (pt has only had one visit and family has not heard back since)  Facility  Assessed needs and reviewed facility placement process; as well as the different levels of care Emotional Support Provided Motivational Interviewing employed Family does not want LTC placement at this time  Mental Health:  Evaluation of current treatment plan related to Anxiety with Panic Symptoms, and Caregiver Stress Active listening / Reflection utilized Consideration on in-home help encouraged : options discussed Crisis Resource Education / information provided Depression screen reviewed Discussed referral for psychiatry: Family prefer pt to see neurology first before considering psychiatry even though this was strongly encouraged Emotional Support Provided Participation in support group encouraged : Caregiver Relief Support Groups PHQ2/PHQ9 completed Provided general psycho-education for mental health needs Quality of sleep assessed & Sleep Hygiene techniques promoted Reviewed mental health medications and discussed importance of compliance:   Suicidal Ideation/Homicidal Ideation assessed: No SI/HI, family made aware of 988 resource Referral placed for VBCI RNCM and BSW per family request. BSW has already provided extensive resources on in home support options-re-referral placed for RNCM due to Stage 5 Kidney Failure Son reports that she has had recent panic attacks, psychiatry referral discussed with family but family wishes to consider this resource first.  VBCI RNCM placed palliative care referral and that initial in home visit was completed on 04/07/24. [name] reports that patient is not sure about enrollment in their program until after patient's nephrologist appointment on 04/21/24.   Patient Goals/Self-Care Activities:  Increase coping skills, healthy habits, self-management skills, and stress reduction  Plan:  The care management team will reach out to the patient again  over the next 30 days.         Please call the Suicide and Crisis Lifeline: 988 call the [place]  National Suicide Prevention Lifeline: 416 464 4934 or TTY: 351-270-6706 TTY 639-065-1299) to talk to a trained counselor call 1-800-273-TALK (toll free, 24 hour hotline) go to [facility] [address], [place] [phone]) call the [facility] Crisis Line: 623-186-0753 call 911 if you are experiencing a Mental Health or Behavioral Health Crisis or need someone to talk to.  Patient verbalized understanding of Care plan and visit instructions communicated this visit  Lyle Rung, BSW, MSW, LCSW Licensed Clinical Social Worker [employer]   [email or web address] Direct Dial : [phone]

## 2024-04-13 ENCOUNTER — Other Ambulatory Visit: Payer: Self-pay | Admitting: *Deleted

## 2024-04-13 NOTE — Patient Instructions (Signed)
 Visit Information  Thank you for taking time to visit with me today. Please don't hesitate to contact me if I can be of assistance to you before our next scheduled appointment.  Your next care management appointment is by telephone on 05-14-2024 at 1:30 pm  Telephone follow-up in 1 month  Please call the care guide team at [phone] if you need to cancel, schedule, or reschedule an appointment.   Please call the Suicide and Crisis Lifeline: 988 call the [place]  National Suicide Prevention Lifeline: 816-862-6389 or TTY: (269)691-9142 TTY 905-081-4486) to talk to a trained counselor call 1-800-273-TALK (toll free, 24 hour hotline) if you are experiencing a Mental Health or Behavioral Health Crisis or need someone to talk to.  Rosina Forte, BSN RN [facility], [facility] RN Care Manager Direct Dial : [phone]  Fax: [phone]

## 2024-04-13 NOTE — Patient Outreach (Signed)
 Complex Care Management   Visit Note  04/13/2024  Name:  Amy Kelly MRN: [id number] DOB: Apr 28, 1928  Situation: Referral received for Complex Care Management related to Chronic Kidney Disease I obtained verbal consent from Patient.  Visit completed with Patient  on the phone  Background:   Past Medical History:  Diagnosis Date   Allergy    Anxiety and depression    Arthritis    At risk for falls    Cancer [facility])    Cataract    CHF (congestive heart failure) (HCC)    Chronic kidney disease    Coronary artery disease    Diabetes mellitus (HCC) 11/08/2020   Diabetes mellitus without complication (HCC)    GERD (gastroesophageal reflux disease)    Heart murmur    History of skin cancer    Right shin   HOH (hard of hearing)    Hyperlipidemia    Hypertension    Hypothyroidism    Neuromuscular disorder (HCC)    Osteoporosis    Oxygen deficiency    Stroke [facility])    Ulcer     Assessment: Patient Reported Symptoms:  Cognitive Cognitive Status: Struggling with memory recall, Requires Assistance Decision Making Cognitive/Intellectual Conditions Management [RPT]: Behavior Disorders   Health Maintenance Behaviors: Annual physical exam Healing Pattern: Unsure Health Facilitated by: Rest, Stress management  Neurological Neurological Review of Symptoms: No symptoms reported Neurological Self-Management Outcome: 4 (good)  HEENT HEENT Symptoms Reported: Ear pain HEENT Management Strategies: Routine screening HEENT Self-Management Outcome: 3 (uncertain)    Cardiovascular Cardiovascular Symptoms Reported: No symptoms reported Does patient have uncontrolled Hypertension?: No Is patient checking Blood Pressure at home?: No    Respiratory Respiratory Symptoms Reported: No symptoms reported Respiratory Management Strategies: Routine screening  Endocrine Endocrine Symptoms Reported: Hypoglycemia Is patient diabetic?: Yes Is patient checking blood sugars at home?: Yes List most recent  blood sugar readings, include date and time of day: unable to provide - states he has not seen her yet this morning Endocrine Self-Management Outcome: 3 (uncertain)  Gastrointestinal Gastrointestinal Symptoms Reported: Change in appetite Gastrointestinal Management Strategies: Coping strategies Gastrointestinal Self-Management Outcome: 3 (uncertain)    Genitourinary Genitourinary Symptoms Reported: Not assessed    Integumentary Integumentary Symptoms Reported: Not assessed    Musculoskeletal Musculoskelatal Symptoms Reviewed: Difficulty walking, Unsteady gait, Weakness Musculoskeletal Management Strategies: Coping strategies Musculoskeletal Self-Management Outcome: 3 (uncertain) Falls in the past year?: Yes Number of falls in past year: 2 or more Was there an injury with Fall?: No Fall Risk Category Calculator: 2 Patient Fall Risk Level: Moderate Fall Risk Patient at Risk for Falls Due to: History of fall(s), Impaired balance/gait Fall risk Follow up: Falls evaluation completed, Education provided  Psychosocial Psychosocial Symptoms Reported: Alteration in eating habits Behavioral Management Strategies: Coping strategies Behavioral Health Self-Management Outcome: 3 (uncertain) Major Change/Loss/Stressor/Fears (CP): Medical condition, self Techniques to Cope with Loss/Stress/Change: Diversional activities      04/13/2024    PHQ2-9 Depression Screening   Little interest or pleasure in doing things    Feeling down, depressed, or hopeless    PHQ-2 - Total Score    Trouble falling or staying asleep, or sleeping too much    Feeling tired or having little energy    Poor appetite or overeating     Feeling bad about yourself - or that you are a failure or have let yourself or your family down    Trouble concentrating on things, such as reading the newspaper or watching television    Moving or  speaking so slowly that other people could have noticed.  Or the opposite - being so fidgety or  restless that you have been moving around a lot more than usual    Thoughts that you would be better off dead, or hurting yourself in some way    PHQ2-9 Total Score    If you checked off any problems, how difficult have these problems made it for you to do your work, take care of things at home, or get along with other people    Depression Interventions/Treatment      There were no vitals filed for this visit. Pain Scale: 0-10 Pain Score: 0-No pain  Medications Reviewed Today     Reviewed by Bertrum Rosina HERO, RN (Registered Nurse) on 04/13/24 at 1102  Med List Status: <None>   Medication Order Taking? Sig Documenting Provider Last Dose Status Informant  amLODipine  (NORVASC ) 5 MG tablet [id number]  TAKE 1 TABLET BY MOUTH DAILY Cook, Jayce G, DO  Active   Ascorbic Acid  (VITAMIN C GUMMIE PO) [id number]  Take 1,000 mg by mouth in the morning. [provider]  Active Family Member  B Complex-C (B-COMPLEX WITH VITAMIN C) tablet [id number]  Take 1 tablet by mouth in the morning. [provider]  Active Family Member  Blood Glucose Monitoring Suppl DEVI [id number]  Use up to 4 times daily to check blood sugar. May substitute to any manufacturer covered by patient's insurance. Cook, Jayce G, DO  Active   calcitRIOL (ROCALTROL) 0.25 MCG capsule [id number]  Take 0.25 mcg by mouth every Monday, Wednesday, and Friday. [provider]  Active Family Member  Calcium -Phosphorus-Vitamin D  (CALCIUM  GUMMIES PO) [id number]  Take 2 tablets by mouth in the morning. [provider]  Active Family Member  clobetasol (TEMOVATE) 0.05 % external solution [id number]  Apply 1 Application topically in the morning. Applied to the back of the neck [provider]  Active Family Member  clopidogrel  (PLAVIX ) 75 MG tablet [id number]  TAKE 1 TABLET BY MOUTH IN THE MORNING [place], Jayce G, DO  Active   Continuous Glucose Receiver (FREESTYLE LIBRE 3 READER) DEVI [id number]  Use to check blood  sugar continuously. Cook, Jayce G, DO  Active   Continuous Glucose Sensor (FREESTYLE LIBRE 3 PLUS SENSOR) [place] [id number]  1 each by Does not apply route continuous. Change every 15 days. Cook, Jayce G, DO  Active   doxazosin  (CARDURA ) 2 MG tablet [id number]  TAKE 1 TABLET BY MOUTH AT BEDTIME Cook, Jayce G, DO  Active   famotidine  (PEPCID ) 20 MG tablet [id number]  Take 1 tablet (20 mg total) by mouth at bedtime. Cook, Jayce G, DO  Active   furosemide  (LASIX ) 40 MG tablet [id number]  Take 2 tablets (80 mg total) by mouth every morning. Cook, Jayce G, DO  Active   hydrALAZINE  (APRESOLINE ) 25 MG tablet [id number]  Take 1 tablet (25 mg total) by mouth 2 (two) times daily. Cook, Jayce G, DO  Active   insulin  glargine (LANTUS  SOLOSTAR) 100 UNIT/ML Solostar Pen [id number]  Inject 20 Units into the skin daily. Th[place], [place], MD  Active   insulin  lispro (HUMALOG  KWIKPEN) 100 UNIT/ML KwikPen [id number]  INJECT 1 TO 10 UNITS UNDER THE SKIN THREE TIMES DAILY BEFORE MEALS PER SLIDING SCALE Cook, Jayce G, DO  Active   Insulin  Pen Needle (EMBECTA PEN NEEDLE ULTRAFINE) 31G X 8 MM MISC [id number]  USE 4 TIMES DAILY FOR INJECT OF INSULIN  [place], Jayce G, DO  Active   levothyroxine  (SYNTHROID )  75 MCG tablet [id number]  TAKE 1 TABLET BY MOUTH DAILY BEFORE BREAKFAST Cook, Jayce G, DO  Active   mirtazapine  (REMERON ) 7.5 MG tablet [id number]  Take 1 tablet (7.5 mg total) by mouth at bedtime. Cook, Jayce G, DO  Active   Multiple Vitamin (MULTIVITAMIN WITH MINERALS) TABS tablet [id number]  Take 1 tablet by mouth in the morning. [provider]  Active Family Member  [facility] ULTRA test strip [id number]  USE UP TO 4 TIMES DAILY TO CHECK BLOOD SUGAR Cook, Jayce G, DO  Active   pantoprazole  (PROTONIX ) 40 MG tablet [id number]  Take 1 tablet (40 mg total) by mouth daily. Cook, Jayce G, DO  Active   polyethylene glycol (MIRALAX  / GLYCOLAX ) 17 g packet [id number]  Take 17 g by mouth every other day. [provider]  Active Family  Member  potassium chloride  (KLOR-CON ) 10 MEQ tablet [id number]  TAKE 1 TABLET BY MOUTH DAILY. GENERIC EQUIVALENT FOR KLOR-CON  Cook, Jayce G, DO  Active   rOPINIRole  (REQUIP ) 2 MG tablet [id number]  Take 1 tablet (2 mg total) by mouth at bedtime. Cook, Jayce G, [place]  Active   rosuvastatin  (CRESTOR ) 40 MG tablet [id number]  TAKE 1 TABLET BY MOUTH AT BEDTIME GENERIC EQUIVALENT FOR CRESTOR  Cook, Jayce G, DO  Active   TRADJENTA  5 MG TABS tablet [id number]  TAKE 1 TABLET BY MOUTH DAILY Thapa, [place], MD  Active   traMADol  (ULTRAM ) 50 MG tablet [id number]  Take 1 tablet (50 mg total) by mouth at bedtime as needed for severe pain (pain score 7-10) or moderate pain (pain score 4-6). Cook, Jayce G, DO  Active   triamcinolone  ointment (KENALOG ) 0.5 % [id number]  Apply 1 Application topically 2 (two) times daily. Cook, Jayce G, DO  Active   Vibegron  (GEMTESA ) 75 MG TABS [id number]  Take 1 tablet (75 mg total) by mouth daily. McKenzie, Belvie CROME, MD  Active   vitamin B-12 (CYANOCOBALAMIN ) 500 MCG tablet [id number]  Take 1,000 mcg by mouth in the morning. [provider]  Active Family Member            Recommendation:   Continue Current Plan of Care  Follow Up Plan:   Telephone follow-up in 1 month  Rosina Forte, BSN RN [facility], [facility] RN Care Manager Direct Dial : [phone]  Fax: [phone]

## 2024-04-18 ENCOUNTER — Other Ambulatory Visit: Payer: Self-pay | Admitting: Family Medicine

## 2024-04-18 DIAGNOSIS — G629 Polyneuropathy, unspecified: Secondary | ICD-10-CM

## 2024-04-20 ENCOUNTER — Ambulatory Visit: Admitting: Family Medicine

## 2024-04-27 ENCOUNTER — Ambulatory Visit: Admitting: Family Medicine

## 2024-04-30 ENCOUNTER — Telehealth: Admitting: Licensed Clinical Social Worker

## 2024-05-14 ENCOUNTER — Telehealth: Admitting: *Deleted

## 2024-05-19 ENCOUNTER — Ambulatory Visit: Admitting: Endocrinology

## 2024-07-21 ENCOUNTER — Ambulatory Visit: Admitting: Neurology

## 2024-08-13 ENCOUNTER — Ambulatory Visit: Admitting: Neurology

## 2024-08-16 ENCOUNTER — Ambulatory Visit: Admitting: Neurology

## 2024-10-20 ENCOUNTER — Ambulatory Visit: Admitting: Urology
